# Patient Record
Sex: Female | Born: 1945
Health system: Southern US, Community
[De-identification: ages and names within clinical notes are randomized; demographics above are authoritative.]

## PROBLEM LIST (undated history)

## (undated) DIAGNOSIS — F419 Anxiety disorder, unspecified: Secondary | ICD-10-CM

## (undated) DIAGNOSIS — C50919 Malignant neoplasm of unspecified site of unspecified female breast: Secondary | ICD-10-CM

## (undated) DIAGNOSIS — K219 Gastro-esophageal reflux disease without esophagitis: Secondary | ICD-10-CM

## (undated) DIAGNOSIS — J310 Chronic rhinitis: Secondary | ICD-10-CM

## (undated) DIAGNOSIS — Z87898 Personal history of other specified conditions: Secondary | ICD-10-CM

## (undated) DIAGNOSIS — I10 Essential (primary) hypertension: Secondary | ICD-10-CM

## (undated) DIAGNOSIS — M1990 Unspecified osteoarthritis, unspecified site: Secondary | ICD-10-CM

## (undated) DIAGNOSIS — E7800 Pure hypercholesterolemia, unspecified: Secondary | ICD-10-CM

## (undated) DIAGNOSIS — K589 Irritable bowel syndrome without diarrhea: Secondary | ICD-10-CM

## (undated) DIAGNOSIS — K6389 Other specified diseases of intestine: Secondary | ICD-10-CM

## (undated) DIAGNOSIS — I872 Venous insufficiency (chronic) (peripheral): Secondary | ICD-10-CM

## (undated) HISTORY — DX: Chronic rhinitis: J31.0

## (undated) HISTORY — DX: Anxiety disorder, unspecified: F41.9

## (undated) HISTORY — DX: Essential (primary) hypertension: I10

## (undated) HISTORY — DX: Pure hypercholesterolemia, unspecified: E78.00

## (undated) HISTORY — DX: Personal history of other specified conditions: Z87.898

## (undated) HISTORY — DX: Irritable bowel syndrome, unspecified: K58.9

## (undated) HISTORY — DX: Venous insufficiency (chronic) (peripheral): I87.2

## (undated) HISTORY — DX: Unspecified osteoarthritis, unspecified site: M19.90

## (undated) HISTORY — PX: OTHER SURGICAL HISTORY: SHX169

## (undated) HISTORY — DX: Other specified diseases of intestine: K63.89

## (undated) HISTORY — DX: Malignant neoplasm of unspecified site of unspecified female breast: C50.919

## (undated) HISTORY — PX: TUBAL LIGATION: SHX77

## (undated) HISTORY — DX: Gastro-esophageal reflux disease without esophagitis: K21.9

## (undated) HISTORY — PX: TOTAL ABDOMINAL HYSTERECTOMY: SHX209

---

## 1996-07-01 ENCOUNTER — Encounter: Payer: Self-pay | Admitting: Gastroenterology

## 1999-08-18 ENCOUNTER — Encounter: Admission: RE | Admit: 1999-08-18 | Discharge: 1999-08-18 | Payer: Self-pay | Admitting: Obstetrics and Gynecology

## 1999-08-18 ENCOUNTER — Encounter: Payer: Self-pay | Admitting: Obstetrics and Gynecology

## 1999-10-24 HISTORY — PX: NEPHRECTOMY: SHX65

## 2000-03-15 ENCOUNTER — Other Ambulatory Visit: Admission: RE | Admit: 2000-03-15 | Discharge: 2000-03-15 | Payer: Self-pay | Admitting: *Deleted

## 2000-07-11 ENCOUNTER — Encounter: Payer: Self-pay | Admitting: Family Medicine

## 2000-08-07 ENCOUNTER — Encounter: Admission: RE | Admit: 2000-08-07 | Discharge: 2000-08-07 | Payer: Self-pay | Admitting: Urology

## 2000-08-07 ENCOUNTER — Encounter: Payer: Self-pay | Admitting: Urology

## 2000-08-08 ENCOUNTER — Encounter: Admission: RE | Admit: 2000-08-08 | Discharge: 2000-08-08 | Payer: Self-pay | Admitting: Urology

## 2000-08-08 ENCOUNTER — Encounter: Payer: Self-pay | Admitting: Urology

## 2000-08-23 ENCOUNTER — Encounter: Payer: Self-pay | Admitting: Obstetrics and Gynecology

## 2000-08-23 ENCOUNTER — Encounter: Admission: RE | Admit: 2000-08-23 | Discharge: 2000-08-23 | Payer: Self-pay | Admitting: Obstetrics and Gynecology

## 2000-10-03 ENCOUNTER — Encounter: Payer: Self-pay | Admitting: Pulmonary Disease

## 2000-10-03 ENCOUNTER — Ambulatory Visit (HOSPITAL_COMMUNITY): Admission: RE | Admit: 2000-10-03 | Discharge: 2000-10-03 | Payer: Self-pay | Admitting: Pulmonary Disease

## 2001-08-27 ENCOUNTER — Encounter: Admission: RE | Admit: 2001-08-27 | Discharge: 2001-08-27 | Payer: Self-pay | Admitting: Obstetrics and Gynecology

## 2001-08-27 ENCOUNTER — Encounter: Payer: Self-pay | Admitting: Obstetrics and Gynecology

## 2002-08-28 ENCOUNTER — Encounter: Admission: RE | Admit: 2002-08-28 | Discharge: 2002-08-28 | Payer: Self-pay | Admitting: Obstetrics and Gynecology

## 2002-08-28 ENCOUNTER — Encounter: Payer: Self-pay | Admitting: Obstetrics and Gynecology

## 2002-09-03 ENCOUNTER — Encounter: Payer: Self-pay | Admitting: Obstetrics and Gynecology

## 2002-09-03 ENCOUNTER — Encounter: Admission: RE | Admit: 2002-09-03 | Discharge: 2002-09-03 | Payer: Self-pay | Admitting: Obstetrics and Gynecology

## 2003-07-29 ENCOUNTER — Other Ambulatory Visit: Admission: RE | Admit: 2003-07-29 | Discharge: 2003-07-29 | Payer: Self-pay | Admitting: Obstetrics and Gynecology

## 2003-08-31 ENCOUNTER — Encounter: Admission: RE | Admit: 2003-08-31 | Discharge: 2003-08-31 | Payer: Self-pay | Admitting: Obstetrics and Gynecology

## 2004-08-31 ENCOUNTER — Ambulatory Visit (HOSPITAL_COMMUNITY): Admission: RE | Admit: 2004-08-31 | Discharge: 2004-08-31 | Payer: Self-pay | Admitting: Obstetrics and Gynecology

## 2004-10-04 ENCOUNTER — Ambulatory Visit: Payer: Self-pay | Admitting: Pulmonary Disease

## 2004-10-28 ENCOUNTER — Ambulatory Visit: Payer: Self-pay | Admitting: Pulmonary Disease

## 2005-02-09 ENCOUNTER — Ambulatory Visit: Payer: Self-pay | Admitting: Pulmonary Disease

## 2005-07-12 ENCOUNTER — Ambulatory Visit: Payer: Self-pay | Admitting: Pulmonary Disease

## 2005-08-15 ENCOUNTER — Other Ambulatory Visit: Admission: RE | Admit: 2005-08-15 | Discharge: 2005-08-15 | Payer: Self-pay | Admitting: Obstetrics and Gynecology

## 2005-10-03 ENCOUNTER — Ambulatory Visit (HOSPITAL_COMMUNITY): Admission: RE | Admit: 2005-10-03 | Discharge: 2005-10-03 | Payer: Self-pay | Admitting: Obstetrics and Gynecology

## 2006-01-09 ENCOUNTER — Ambulatory Visit: Payer: Self-pay | Admitting: Pulmonary Disease

## 2006-07-10 ENCOUNTER — Ambulatory Visit: Payer: Self-pay | Admitting: Pulmonary Disease

## 2006-07-25 ENCOUNTER — Ambulatory Visit: Payer: Self-pay | Admitting: Pulmonary Disease

## 2006-10-09 ENCOUNTER — Ambulatory Visit (HOSPITAL_COMMUNITY): Admission: RE | Admit: 2006-10-09 | Discharge: 2006-10-09 | Payer: Self-pay | Admitting: Pulmonary Disease

## 2007-01-08 ENCOUNTER — Ambulatory Visit: Payer: Self-pay | Admitting: Pulmonary Disease

## 2007-07-08 ENCOUNTER — Ambulatory Visit: Payer: Self-pay | Admitting: Pulmonary Disease

## 2007-07-08 LAB — CONVERTED CEMR LAB
ALT: 14 units/L (ref 0–35)
AST: 20 units/L (ref 0–37)
Albumin: 3.7 g/dL (ref 3.5–5.2)
Alkaline Phosphatase: 57 units/L (ref 39–117)
BUN: 12 mg/dL (ref 6–23)
Bacteria, UA: NEGATIVE
Basophils Absolute: 0 10*3/uL (ref 0.0–0.1)
Basophils Relative: 0.3 % (ref 0.0–1.0)
Bilirubin Urine: NEGATIVE
Bilirubin, Direct: 0.1 mg/dL (ref 0.0–0.3)
CO2: 33 meq/L — ABNORMAL HIGH (ref 19–32)
Calcium: 9.7 mg/dL (ref 8.4–10.5)
Chloride: 108 meq/L (ref 96–112)
Cholesterol: 213 mg/dL (ref 0–200)
Creatinine, Ser: 1 mg/dL (ref 0.4–1.2)
Crystals: NEGATIVE
Direct LDL: 149.6 mg/dL
Eosinophils Absolute: 0.1 10*3/uL (ref 0.0–0.6)
Eosinophils Relative: 1.3 % (ref 0.0–5.0)
GFR calc Af Amer: 73 mL/min
GFR calc non Af Amer: 60 mL/min
Glucose, Bld: 89 mg/dL (ref 70–99)
HCT: 38.3 % (ref 36.0–46.0)
HDL: 53.1 mg/dL (ref >39.0)
Hemoglobin, Urine: NEGATIVE
Hemoglobin: 13.1 g/dL (ref 12.0–15.0)
Ketones, ur: NEGATIVE mg/dL
Lymphocytes Relative: 33.2 % (ref 12.0–46.0)
MCHC: 34.2 g/dL (ref 30.0–36.0)
MCV: 89.8 fL (ref 78.0–100.0)
Monocytes Absolute: 0.3 10*3/uL (ref 0.2–0.7)
Monocytes Relative: 6.2 % (ref 3.0–11.0)
Mucus, UA: NEGATIVE
Neutro Abs: 2.9 10*3/uL (ref 1.4–7.7)
Neutrophils Relative %: 59 % (ref 43.0–77.0)
Nitrite: NEGATIVE
Platelets: 287 10*3/uL (ref 150–400)
Potassium: 4.1 meq/L (ref 3.5–5.1)
RBC / HPF: NONE SEEN
RBC: 4.27 M/uL (ref 3.87–5.11)
RDW: 12.9 % (ref 11.5–14.6)
Sodium: 144 meq/L (ref 135–145)
Specific Gravity, Urine: 1.02 (ref 1.000–1.03)
TSH: 1.73 microintl units/mL (ref 0.35–5.50)
Total Bilirubin: 1 mg/dL (ref 0.3–1.2)
Total CHOL/HDL Ratio: 4
Total Protein, Urine: NEGATIVE mg/dL
Total Protein: 7 g/dL (ref 6.0–8.3)
Triglycerides: 84 mg/dL (ref 0–149)
Urine Glucose: NEGATIVE mg/dL
Urobilinogen, UA: 0.2 (ref 0.0–1.0)
VLDL: 17 mg/dL (ref 0–40)
WBC: 5 10*3/uL (ref 4.5–10.5)
pH: 6.5 (ref 5.0–8.0)

## 2007-07-29 ENCOUNTER — Ambulatory Visit: Payer: Self-pay | Admitting: Gastroenterology

## 2007-09-16 ENCOUNTER — Other Ambulatory Visit: Admission: RE | Admit: 2007-09-16 | Discharge: 2007-09-16 | Payer: Self-pay | Admitting: Obstetrics and Gynecology

## 2007-10-14 ENCOUNTER — Ambulatory Visit (HOSPITAL_COMMUNITY): Admission: RE | Admit: 2007-10-14 | Discharge: 2007-10-14 | Payer: Self-pay | Admitting: Pulmonary Disease

## 2007-12-12 ENCOUNTER — Telehealth (INDEPENDENT_AMBULATORY_CARE_PROVIDER_SITE_OTHER): Payer: Self-pay | Admitting: *Deleted

## 2008-01-02 DIAGNOSIS — K589 Irritable bowel syndrome without diarrhea: Secondary | ICD-10-CM | POA: Insufficient documentation

## 2008-01-02 DIAGNOSIS — E7800 Pure hypercholesterolemia, unspecified: Secondary | ICD-10-CM | POA: Insufficient documentation

## 2008-01-02 DIAGNOSIS — I872 Venous insufficiency (chronic) (peripheral): Secondary | ICD-10-CM | POA: Insufficient documentation

## 2008-01-02 DIAGNOSIS — K219 Gastro-esophageal reflux disease without esophagitis: Secondary | ICD-10-CM | POA: Insufficient documentation

## 2008-01-02 DIAGNOSIS — I10 Essential (primary) hypertension: Secondary | ICD-10-CM | POA: Insufficient documentation

## 2008-01-02 DIAGNOSIS — E785 Hyperlipidemia, unspecified: Secondary | ICD-10-CM | POA: Insufficient documentation

## 2008-01-06 ENCOUNTER — Ambulatory Visit: Payer: Self-pay | Admitting: Pulmonary Disease

## 2008-01-08 ENCOUNTER — Encounter: Payer: Self-pay | Admitting: Pulmonary Disease

## 2008-01-11 DIAGNOSIS — R519 Headache, unspecified: Secondary | ICD-10-CM | POA: Insufficient documentation

## 2008-01-11 DIAGNOSIS — C649 Malignant neoplasm of unspecified kidney, except renal pelvis: Secondary | ICD-10-CM

## 2008-01-11 DIAGNOSIS — M1990 Unspecified osteoarthritis, unspecified site: Secondary | ICD-10-CM | POA: Insufficient documentation

## 2008-01-11 DIAGNOSIS — F411 Generalized anxiety disorder: Secondary | ICD-10-CM | POA: Insufficient documentation

## 2008-01-11 DIAGNOSIS — N3000 Acute cystitis without hematuria: Secondary | ICD-10-CM | POA: Insufficient documentation

## 2008-01-11 DIAGNOSIS — R51 Headache: Secondary | ICD-10-CM | POA: Insufficient documentation

## 2008-01-11 HISTORY — DX: Malignant neoplasm of unspecified kidney, except renal pelvis: C64.9

## 2008-01-11 LAB — CONVERTED CEMR LAB
ALT: 15 units/L (ref 0–35)
AST: 19 units/L (ref 0–37)
Albumin: 3.7 g/dL (ref 3.5–5.2)
Alkaline Phosphatase: 55 units/L (ref 39–117)
BUN: 14 mg/dL (ref 6–23)
Basophils Absolute: 0 10*3/uL (ref 0.0–0.1)
Basophils Relative: 0.8 % (ref 0.0–1.0)
Bilirubin Urine: NEGATIVE
Bilirubin, Direct: 0.1 mg/dL (ref 0.0–0.3)
CO2: 31 meq/L (ref 19–32)
Calcium: 9.8 mg/dL (ref 8.4–10.5)
Chloride: 105 meq/L (ref 96–112)
Cholesterol: 198 mg/dL (ref 0–200)
Creatinine, Ser: 1 mg/dL (ref 0.4–1.2)
Crystals: NEGATIVE
Eosinophils Absolute: 0.1 10*3/uL (ref 0.0–0.6)
Eosinophils Relative: 1.3 % (ref 0.0–5.0)
GFR calc Af Amer: 73 mL/min
GFR calc non Af Amer: 60 mL/min
Glucose, Bld: 86 mg/dL (ref 70–99)
HCT: 38.4 % (ref 36.0–46.0)
HDL: 59 mg/dL (ref >39.0)
Hemoglobin: 12.8 g/dL (ref 12.0–15.0)
Ketones, ur: NEGATIVE mg/dL
LDL Cholesterol: 123 mg/dL — ABNORMAL HIGH (ref 0–99)
Lymphocytes Relative: 34 % (ref 12.0–46.0)
MCHC: 33.3 g/dL (ref 30.0–36.0)
MCV: 90.2 fL (ref 78.0–100.0)
Monocytes Absolute: 0.5 10*3/uL (ref 0.2–0.7)
Monocytes Relative: 8 % (ref 3.0–11.0)
Mucus, UA: NEGATIVE
Neutro Abs: 3.2 10*3/uL (ref 1.4–7.7)
Neutrophils Relative %: 55.9 % (ref 43.0–77.0)
Nitrite: POSITIVE — AB
Platelets: 308 10*3/uL (ref 150–400)
Potassium: 3.7 meq/L (ref 3.5–5.1)
RBC: 4.25 M/uL (ref 3.87–5.11)
RDW: 13.1 % (ref 11.5–14.6)
Sodium: 143 meq/L (ref 135–145)
Specific Gravity, Urine: 1.02 (ref 1.000–1.03)
TSH: 1.55 microintl units/mL (ref 0.35–5.50)
Total Bilirubin: 0.7 mg/dL (ref 0.3–1.2)
Total CHOL/HDL Ratio: 3.4
Total Protein, Urine: NEGATIVE mg/dL
Total Protein: 7.1 g/dL (ref 6.0–8.3)
Triglycerides: 80 mg/dL (ref 0–149)
Urine Glucose: NEGATIVE mg/dL
Urobilinogen, UA: 0.2 (ref 0.0–1.0)
VLDL: 16 mg/dL (ref 0–40)
WBC: 5.7 10*3/uL (ref 4.5–10.5)
pH: 6 (ref 5.0–8.0)

## 2008-07-06 ENCOUNTER — Ambulatory Visit: Payer: Self-pay | Admitting: Pulmonary Disease

## 2008-07-08 LAB — CONVERTED CEMR LAB
BUN: 12 mg/dL (ref 6–23)
CO2: 30 meq/L (ref 19–32)
Calcium: 9.5 mg/dL (ref 8.4–10.5)
Chloride: 109 meq/L (ref 96–112)
Cholesterol: 139 mg/dL (ref 0–200)
Creatinine, Ser: 0.9 mg/dL (ref 0.4–1.2)
GFR calc Af Amer: 82 mL/min
GFR calc non Af Amer: 68 mL/min
Glucose, Bld: 95 mg/dL (ref 70–99)
HDL: 47.3 mg/dL (ref >39.0)
LDL Cholesterol: 77 mg/dL (ref 0–99)
Potassium: 3.7 meq/L (ref 3.5–5.1)
Sodium: 143 meq/L (ref 135–145)
Total CHOL/HDL Ratio: 2.9
Triglycerides: 74 mg/dL (ref 0–149)
VLDL: 15 mg/dL (ref 0–40)

## 2008-08-03 ENCOUNTER — Ambulatory Visit: Payer: Self-pay | Admitting: Gastroenterology

## 2008-08-12 ENCOUNTER — Telehealth (INDEPENDENT_AMBULATORY_CARE_PROVIDER_SITE_OTHER): Payer: Self-pay | Admitting: *Deleted

## 2008-08-20 ENCOUNTER — Ambulatory Visit: Payer: Self-pay | Admitting: Cardiovascular Disease

## 2008-09-01 ENCOUNTER — Telehealth: Payer: Self-pay | Admitting: Gastroenterology

## 2008-09-04 ENCOUNTER — Ambulatory Visit: Payer: Self-pay | Admitting: Gastroenterology

## 2008-09-21 ENCOUNTER — Other Ambulatory Visit: Admission: RE | Admit: 2008-09-21 | Discharge: 2008-09-21 | Payer: Self-pay | Admitting: Obstetrics and Gynecology

## 2008-09-25 ENCOUNTER — Ambulatory Visit: Payer: Self-pay | Admitting: Internal Medicine

## 2008-09-25 LAB — CONVERTED CEMR LAB
ALT: 14 units/L (ref 0–35)
AST: 17 units/L (ref 0–37)
Albumin: 3.5 g/dL (ref 3.5–5.2)
Alkaline Phosphatase: 50 units/L (ref 39–117)
Bilirubin, Direct: 0.1 mg/dL (ref 0.0–0.3)
Cholesterol: 192 mg/dL (ref 0–200)
HDL: 48.7 mg/dL (ref >39.0)
LDL Cholesterol: 125 mg/dL — ABNORMAL HIGH (ref 0–99)
TSH: 1.9 microintl units/mL (ref 0.35–5.50)
Total Bilirubin: 0.7 mg/dL (ref 0.3–1.2)
Total CHOL/HDL Ratio: 3.9
Total Protein: 6.8 g/dL (ref 6.0–8.3)
Triglycerides: 91 mg/dL (ref 0–149)
VLDL: 18 mg/dL (ref 0–40)

## 2008-10-01 ENCOUNTER — Ambulatory Visit: Payer: Self-pay | Admitting: Cardiology

## 2008-10-19 ENCOUNTER — Ambulatory Visit (HOSPITAL_COMMUNITY): Admission: RE | Admit: 2008-10-19 | Discharge: 2008-10-19 | Payer: Self-pay | Admitting: Pulmonary Disease

## 2008-10-29 ENCOUNTER — Telehealth: Payer: Self-pay | Admitting: Pulmonary Disease

## 2008-10-29 ENCOUNTER — Ambulatory Visit: Payer: Self-pay | Admitting: Pulmonary Disease

## 2008-10-29 LAB — CONVERTED CEMR LAB
Bilirubin Urine: NEGATIVE
Hemoglobin, Urine: NEGATIVE
Ketones, ur: NEGATIVE mg/dL
Leukocytes, UA: NEGATIVE
Nitrite: NEGATIVE
Specific Gravity, Urine: 1.015 (ref 1.000–1.03)
Total Protein, Urine: NEGATIVE mg/dL
Urine Glucose: NEGATIVE mg/dL
Urobilinogen, UA: 0.2 (ref 0.0–1.0)
pH: 7 (ref 5.0–8.0)

## 2008-12-23 ENCOUNTER — Ambulatory Visit: Payer: Self-pay | Admitting: Pulmonary Disease

## 2008-12-31 ENCOUNTER — Ambulatory Visit: Payer: Self-pay | Admitting: Cardiovascular Disease

## 2009-01-04 ENCOUNTER — Ambulatory Visit: Payer: Self-pay | Admitting: Pulmonary Disease

## 2009-01-04 LAB — CONVERTED CEMR LAB
ALT: 14 units/L (ref 0–35)
AST: 18 units/L (ref 0–37)
Albumin: 3.6 g/dL (ref 3.5–5.2)
Alkaline Phosphatase: 49 units/L (ref 39–117)
Bilirubin, Direct: 0.1 mg/dL (ref 0.0–0.3)
Cholesterol: 151 mg/dL (ref 0–200)
HDL: 53.6 mg/dL (ref >39.0)
LDL Cholesterol: 82 mg/dL (ref 0–99)
Total Bilirubin: 0.9 mg/dL (ref 0.3–1.2)
Total CHOL/HDL Ratio: 2.8
Total Protein: 6.7 g/dL (ref 6.0–8.3)
Triglycerides: 78 mg/dL (ref 0–149)
VLDL: 16 mg/dL (ref 0–40)

## 2009-01-12 ENCOUNTER — Ambulatory Visit: Payer: Self-pay | Admitting: Internal Medicine

## 2009-01-12 ENCOUNTER — Telehealth (INDEPENDENT_AMBULATORY_CARE_PROVIDER_SITE_OTHER): Payer: Self-pay | Admitting: *Deleted

## 2009-01-12 DIAGNOSIS — J310 Chronic rhinitis: Secondary | ICD-10-CM | POA: Insufficient documentation

## 2009-06-30 ENCOUNTER — Ambulatory Visit: Payer: Self-pay | Admitting: Cardiology

## 2009-07-01 ENCOUNTER — Ambulatory Visit: Payer: Self-pay | Admitting: Cardiovascular Disease

## 2009-07-01 LAB — CONVERTED CEMR LAB
ALT: 13 units/L (ref 0–35)
AST: 17 units/L (ref 0–37)
Albumin: 3.6 g/dL (ref 3.5–5.2)
Alkaline Phosphatase: 44 units/L (ref 39–117)
Bilirubin, Direct: 0.1 mg/dL (ref 0.0–0.3)
Cholesterol: 159 mg/dL (ref 0–200)
HDL: 53.7 mg/dL (ref >39.00)
LDL Cholesterol: 93 mg/dL (ref 0–99)
Total Bilirubin: 0.9 mg/dL (ref 0.3–1.2)
Total CHOL/HDL Ratio: 3
Total Protein: 6.9 g/dL (ref 6.0–8.3)
Triglycerides: 64 mg/dL (ref 0.0–149.0)
VLDL: 12.8 mg/dL (ref 0.0–40.0)

## 2009-07-08 ENCOUNTER — Ambulatory Visit: Payer: Self-pay | Admitting: Pulmonary Disease

## 2009-10-14 ENCOUNTER — Ambulatory Visit: Payer: Self-pay | Admitting: Pulmonary Disease

## 2009-10-20 ENCOUNTER — Ambulatory Visit: Payer: Self-pay | Admitting: Pulmonary Disease

## 2009-10-20 ENCOUNTER — Ambulatory Visit (HOSPITAL_COMMUNITY): Admission: RE | Admit: 2009-10-20 | Discharge: 2009-10-20 | Payer: Self-pay | Admitting: Pulmonary Disease

## 2009-10-24 LAB — CONVERTED CEMR LAB
ALT: 13 units/L (ref 0–35)
AST: 21 units/L (ref 0–37)
Albumin: 3.9 g/dL (ref 3.5–5.2)
Alkaline Phosphatase: 53 units/L (ref 39–117)
BUN: 9 mg/dL (ref 6–23)
Basophils Absolute: 0 10*3/uL (ref 0.0–0.1)
Basophils Relative: 0.8 % (ref 0.0–3.0)
Bilirubin Urine: NEGATIVE
Bilirubin, Direct: 0.1 mg/dL (ref 0.0–0.3)
CO2: 31 meq/L (ref 19–32)
Calcium: 9.5 mg/dL (ref 8.4–10.5)
Chloride: 104 meq/L (ref 96–112)
Cholesterol: 164 mg/dL (ref 0–200)
Creatinine, Ser: 1 mg/dL (ref 0.4–1.2)
Eosinophils Absolute: 0.1 10*3/uL (ref 0.0–0.7)
Eosinophils Relative: 2.5 % (ref 0.0–5.0)
GFR calc non Af Amer: 72.13 mL/min (ref >60)
Glucose, Bld: 94 mg/dL (ref 70–99)
HCT: 40.3 % (ref 36.0–46.0)
HDL: 51.4 mg/dL (ref >39.00)
Hemoglobin, Urine: NEGATIVE
Hemoglobin: 13.2 g/dL (ref 12.0–15.0)
LDL Cholesterol: 98 mg/dL (ref 0–99)
Leukocytes, UA: NEGATIVE
Lymphocytes Relative: 37.7 % (ref 12.0–46.0)
Lymphs Abs: 1.9 10*3/uL (ref 0.7–4.0)
MCHC: 32.8 g/dL (ref 30.0–36.0)
MCV: 95 fL (ref 78.0–100.0)
Monocytes Absolute: 0.5 10*3/uL (ref 0.1–1.0)
Monocytes Relative: 10.3 % (ref 3.0–12.0)
Neutro Abs: 2.6 10*3/uL (ref 1.4–7.7)
Neutrophils Relative %: 48.7 % (ref 43.0–77.0)
Nitrite: NEGATIVE
Platelets: 268 10*3/uL (ref 150.0–400.0)
Potassium: 3.8 meq/L (ref 3.5–5.1)
RBC: 4.24 M/uL (ref 3.87–5.11)
RDW: 13.1 % (ref 11.5–14.6)
Sodium: 141 meq/L (ref 135–145)
Specific Gravity, Urine: 1.02 (ref 1.000–1.030)
TSH: 2.26 microintl units/mL (ref 0.35–5.50)
Total Bilirubin: 0.8 mg/dL (ref 0.3–1.2)
Total CHOL/HDL Ratio: 3
Total Protein: 7.2 g/dL (ref 6.0–8.3)
Triglycerides: 74 mg/dL (ref 0.0–149.0)
Urine Glucose: NEGATIVE mg/dL
Urobilinogen, UA: 0.2 (ref 0.0–1.0)
VLDL: 14.8 mg/dL (ref 0.0–40.0)
Vit D, 25-Hydroxy: 54 ng/mL (ref 30–89)
WBC: 5.1 10*3/uL (ref 4.5–10.5)
pH: 6.5 (ref 5.0–8.0)

## 2009-12-28 ENCOUNTER — Ambulatory Visit: Payer: Self-pay | Admitting: Pulmonary Disease

## 2009-12-30 ENCOUNTER — Ambulatory Visit: Payer: Self-pay | Admitting: Cardiology

## 2010-01-17 LAB — CONVERTED CEMR LAB
ALT: 22 units/L (ref 0–35)
AST: 20 units/L (ref 0–37)
Albumin: 3.8 g/dL (ref 3.5–5.2)
Alkaline Phosphatase: 60 units/L (ref 39–117)
Bilirubin, Direct: 0.1 mg/dL (ref 0.0–0.3)
Cholesterol: 163 mg/dL (ref 0–200)
HDL: 56.3 mg/dL (ref >39.00)
LDL Cholesterol: 92 mg/dL (ref 0–99)
Total Bilirubin: 0.7 mg/dL (ref 0.3–1.2)
Total CHOL/HDL Ratio: 3
Total Protein: 7.6 g/dL (ref 6.0–8.3)
Triglycerides: 73 mg/dL (ref 0.0–149.0)
VLDL: 14.6 mg/dL (ref 0.0–40.0)

## 2010-01-25 ENCOUNTER — Telehealth: Payer: Self-pay | Admitting: Pulmonary Disease

## 2010-01-27 ENCOUNTER — Ambulatory Visit: Payer: Self-pay | Admitting: Pulmonary Disease

## 2010-04-12 ENCOUNTER — Ambulatory Visit: Payer: Self-pay | Admitting: Pulmonary Disease

## 2010-07-04 ENCOUNTER — Telehealth: Payer: Self-pay | Admitting: Pulmonary Disease

## 2010-10-12 ENCOUNTER — Ambulatory Visit: Payer: Self-pay | Admitting: Pulmonary Disease

## 2010-10-21 ENCOUNTER — Encounter: Payer: Self-pay | Admitting: Pulmonary Disease

## 2010-10-21 ENCOUNTER — Ambulatory Visit (HOSPITAL_COMMUNITY)
Admission: RE | Admit: 2010-10-21 | Discharge: 2010-10-21 | Payer: Self-pay | Source: Home / Self Care | Attending: Pulmonary Disease | Admitting: Pulmonary Disease

## 2010-10-23 LAB — CONVERTED CEMR LAB
ALT: 18 units/L (ref 0–35)
AST: 21 units/L (ref 0–37)
Albumin: 4 g/dL (ref 3.5–5.2)
Alkaline Phosphatase: 67 units/L (ref 39–117)
BUN: 14 mg/dL (ref 6–23)
Basophils Absolute: 0.1 10*3/uL (ref 0.0–0.1)
Basophils Relative: 1.3 % (ref 0.0–3.0)
Bilirubin Urine: NEGATIVE
Bilirubin, Direct: 0.1 mg/dL (ref 0.0–0.3)
Blood, UA: NEGATIVE
CO2: 31 meq/L (ref 19–32)
Calcium: 9.8 mg/dL (ref 8.4–10.5)
Chloride: 108 meq/L (ref 96–112)
Cholesterol: 172 mg/dL (ref 0–200)
Creatinine, Ser: 1 mg/dL (ref 0.4–1.2)
Eosinophils Absolute: 0.1 10*3/uL (ref 0.0–0.7)
Eosinophils Relative: 1.8 % (ref 0.0–5.0)
GFR calc non Af Amer: 71.08 mL/min (ref >60.00)
Glucose, Bld: 82 mg/dL (ref 70–99)
HCT: 38.4 % (ref 36.0–46.0)
HDL: 50.3 mg/dL (ref >39.00)
Hemoglobin: 13 g/dL (ref 12.0–15.0)
Ketones, ur: NEGATIVE mg/dL
LDL Cholesterol: 108 mg/dL — ABNORMAL HIGH (ref 0–99)
Lymphocytes Relative: 33 % (ref 12.0–46.0)
Lymphs Abs: 1.9 10*3/uL (ref 0.7–4.0)
MCHC: 34 g/dL (ref 30.0–36.0)
MCV: 90.6 fL (ref 78.0–100.0)
Monocytes Absolute: 0.4 10*3/uL (ref 0.1–1.0)
Monocytes Relative: 6.9 % (ref 3.0–12.0)
Neutro Abs: 3.3 10*3/uL (ref 1.4–7.7)
Neutrophils Relative %: 57 % (ref 43.0–77.0)
Nitrite: NEGATIVE
Platelets: 285 10*3/uL (ref 150.0–400.0)
Potassium: 4.8 meq/L (ref 3.5–5.1)
RBC: 4.23 M/uL (ref 3.87–5.11)
RDW: 15 % — ABNORMAL HIGH (ref 11.5–14.6)
Sodium: 143 meq/L (ref 135–145)
Specific Gravity, Urine: 1.025 (ref 1.000–1.030)
TSH: 1.88 microintl units/mL (ref 0.35–5.50)
Total Bilirubin: 0.8 mg/dL (ref 0.3–1.2)
Total CHOL/HDL Ratio: 3
Total Protein, Urine: NEGATIVE mg/dL
Total Protein: 7.1 g/dL (ref 6.0–8.3)
Triglycerides: 69 mg/dL (ref 0.0–149.0)
Urine Glucose: NEGATIVE mg/dL
Urobilinogen, UA: 0.2 (ref 0.0–1.0)
VLDL: 13.8 mg/dL (ref 0.0–40.0)
WBC: 5.7 10*3/uL (ref 4.5–10.5)
pH: 6 (ref 5.0–8.0)

## 2010-11-22 ENCOUNTER — Telehealth: Payer: Self-pay | Admitting: Pulmonary Disease

## 2010-11-22 NOTE — Assessment & Plan Note (Signed)
Summary: rov 6 months///kp   Primary Care Provider:  Kriste Basque  CC:  6 month ROV & review of mult medical problems....  History of Present Illness: 66 y/o BF here for a follow up visit... she has mult med problems as noted below...    ~  Mar10:  she continues to c/o right knee pain (?injury in [place] last yr)... she was seen at [facility]- had XRays & shot, but she wasn't satisfied... she states that she'll try OTC Advil + Tylenol and an exercise program... I rec that she consider a knee brace when walking... otherw doing well without new complaints or concerns...  ~  Dec10:  doing well without new complaints... sister dx w/ breast ca & pt concerned about her hormones- slowly weaning down after discussing this w/ Gyn... she's also taking some "powder" and peppermint from her herbalist & her IBS is improved...    ~  April 12, 2010:  her Avalide was changed to Hyzaar per her insurnce co & BP remains well controlled... she has been tolerating & FLP looks good on the Crestor5- 1/2 daily... she reports bowels doing very well on "Daily Cleanse Pill" from the health food store... she's been working out at the gym regularly but weight up 6#, therefore we reviewed diet recommendations.    Current Problems:   HYPERTENSION (ICD-401.9) - controlled on LOSARTAN/ HCT 100-12.5 taking 1/2 tab daily... BP=110/82 and tol Rx well... she denies HA, fatigue, visual changes, CP, palipit, dizziness, syncope, dyspnea, edema, etc...  VENOUS INSUFFICIENCY (ICD-459.81) - low salt diet, support hose when nec... doing well.  HYPERCHOLESTEROLEMIA (ICD-272.0) - changed to CRESTOR 5mg - 1/2 tab daily + diet therapy + OTC supplements... weight=188# is up 6# in 14mo...   We discussed diet +exercise to get weight back down... referred to Lipid Clinic 12/09.  ~  FLP 3/09 showed TChol 198, TG 80, HDL 59, LDL 123... not at goal- start Simvast20/d.  ~  FLP 9/09 showed TChol 139, TG 74, HDL 47, LDL 77... doing well on Simva20, but  "intol" per pt.  ~  FLP 3/10 on Crestor2.5 showed TChol 151, TG 78, HDL 54, LDL 82... continue same & Lipid Clinic.  ~  FLP 9/10 showed TChol 159, TG 64, HDL 54, LDL 93  ~  FLP 12/10 showed TChol 164, TG 74, HDL 51, LDL 98  ~  FLP 3/11 showed TChol 163, TG 73, HDL 56, LDL 92  GERD (ICD-530.81)  IRRITABLE BOWEL SYNDROME (ICD-564.1) - followed by [name] and notes reviewed... he rec increased fiber, Benefiber, Robinul as needed... she prefers therapy from an herbalist and taking "daily cleanse pill" - improved.  ~  prev colonoscopy 9/01 by [name] was WNL.Marland Kitchen.   ~  f/u colonoscopy 11/09 by [name] was neg- WNL, f/u planned 28yrs...  Hx of CARCINOMA, RENAL CELL (ICD-189.0) - s/p right nephrectomy 2001 at [facility] by [facility]... he signed off in 2006 and exam/ CXR's have been neg-   ~  f/u CXR 9/09 was clear, WNL...  ~  12/10:  we discussed CXR (neg- WNL) & full labs (OK)& she will return for Fasting blood work...  DEGENERATIVE JOINT DISEASE (ICD-715.90) - she's improved w/ exercise program... but c/o bilat R>L knee discomfort w/ eval by Ortho (?who) w/ XRays and shot... doesn't want Rx- using OTC anti-inflamm + Tylenol...  Hx of HEADACHE (ICD-784.0)  ANXIETY (ICD-300.00) - she does not wish to have her Alpraz refilled...  Health Maintenance - GYN= [name] and she does PAPs  and checked Vit D level on pt (pt reports taking 29562 u Vit D weekly from her)... she notes sister dx w/ breast cancer 2010 & she wants to wean off her hormone Rx, & has discussed this w/ Gyn... she gets regular Mammograms as well...   Preventive Screening-Counseling & Management  Alcohol-Tobacco     Smoking Status: quit     Year Quit: 1990  Allergies (verified): No Known Drug Allergies  Comments:  Nurse/Medical Assistant: The patient's medications and allergies were reviewed with the patient and were updated in the Medication and Allergy Lists.  Past History:  Past Medical History: CHRONIC RHINITIS  (ICD-472.0) HYPERTENSION (ICD-401.9) VENOUS INSUFFICIENCY (ICD-459.81) HYPERCHOLESTEROLEMIA (ICD-272.0) GERD (ICD-530.81) IRRITABLE BOWEL SYNDROME (ICD-564.1) Hx of CARCINOMA, RENAL CELL (ICD-189.0) DEGENERATIVE JOINT DISEASE (ICD-715.90) Hx of HEADACHE (ICD-784.0) ANXIETY (ICD-300.00)  Past Surgical History: S/P right nephrectomy for renal cell CA in 2001 S/P hysterectomy  Family History: Reviewed history from 07/06/2008 and no changes required. Mother alive age 88 Father deceased age 63 from prostate cancer 1 sibling alive age 7 1 sibling alive age 25  1 sbiling alive age 85  Social History: Reviewed history from 07/06/2008 and no changes required. quit smoking 20+ years ago--quit 1990 smoked for 10 years exercises 3 times per week caffeine use:  2 cups per day etoh---1 per month divorced 2 children  Review of Systems      See HPI  The patient denies anorexia, fever, weight loss, weight gain, vision loss, decreased hearing, hoarseness, chest pain, syncope, dyspnea on exertion, peripheral edema, prolonged cough, headaches, hemoptysis, abdominal pain, melena, hematochezia, severe indigestion/heartburn, hematuria, incontinence, muscle weakness, suspicious skin lesions, transient blindness, difficulty walking, depression, unusual weight change, abnormal bleeding, enlarged lymph nodes, and angioedema.    Vital Signs:  Patient profile:   65 year old female Height:      65 inches Weight:      188 pounds BMI:     31.40 O2 Sat:      100 % on Room air Temp:     98.7 degrees F oral Pulse rate:   53 / minute BP sitting:   110 / 82  (left arm) Cuff size:   regular  Vitals Entered By: Randell Loop CMA (April 12, 2010 10:25 AM)  O2 Sat at Rest %:  100 O2 Flow:  Room air CC: 6 month ROV & review of mult medical problems... Is Patient Diabetic? No Pain Assessment Patient in pain? no      Comments meds updated today with pt   Physical Exam  Additional Exam:  WD,  Overweight, 65 y/o BF in NAD... GENERAL:  Alert & oriented; pleasant & cooperative... HEENT:  [place]/AT, EOM-wnl, PERRLA, EACs-clear, TMs-wnl, NOSE-clear, THROAT-clear & wnl. NECK:  Supple w/ fairROM; no JVD; normal carotid impulses w/o bruits; no thyromegaly or nodules palpated; no lymphadenopathy. CHEST:  Clear to P & A; without wheezes/ rales/ or rhonchi. HEART:  Regular Rhythm; without murmurs/ rubs/ or gallops. ABDOMEN:  Soft & nontender; normal bowel sounds; no organomegaly or masses detected. EXT: without deformities, mod arthritic changes w/ bilat knee crepitus on exam;  no varicose veins/ +venous insuffic/ no edema. NEURO:  CN's intact; motor testing normal; sensory testing normal; gait normal & balance OK. DERM:  No lesions noted; no rash etc... flower tatoo over right breast area...    Impression & Recommendations:  Problem # 1:  HYPERTENSION (ICD-401.9) Controlled on meds... Her updated medication list for this problem includes:    Losartan Potassium-hctz 100-12.5 Mg  Tabs (Losartan potassium-hctz) .Marland Kitchen... 1/2 by mouth once daily  Problem # 2:  HYPERCHOLESTEROLEMIA (ICD-272.0) Doing well on Crestor 2.5mg /d... Her updated medication list for this problem includes:    Crestor 5 Mg Tabs (Rosuvastatin calcium) .Marland Kitchen... Take 1/2 tab by mouth once daily...  Problem # 3:  IRRITABLE BOWEL SYNDROME (ICD-564.1) Stable w/ supplements from her herbalist...  Problem # 4:  Hx of CARCINOMA, RENAL CELL (ICD-189.0) No signs of recurrent dis>  labs & CXR 12/10 were WNL.Marland KitchenMarland Kitchen  Problem # 5:  DEGENERATIVE JOINT DISEASE (ICD-715.90) Stable on exercise program... Her updated medication list for this problem includes:    Adult Aspirin Low Strength 81 Mg Tbdp (Aspirin) .Marland Kitchen... 1 tab by mouth once daily...  Problem # 6:  ANXIETY (ICD-300.00) She still does not want anxiolytic Rx...  Problem # 7:  OTHER MEDICAL PROBLEMS AS NOTED>>>  Complete Medication List: 1)  Adult Aspirin Low Strength 81 Mg Tbdp  (Aspirin) .Marland Kitchen.. 1 tab by mouth once daily.Marland KitchenMarland Kitchen 2)  Losartan Potassium-hctz 100-12.5 Mg Tabs (Losartan potassium-hctz) .... 1/2 by mouth once daily 3)  Crestor 5 Mg Tabs (Rosuvastatin calcium) .... Take 1/2 tab by mouth once daily.Marland KitchenMarland Kitchen 4)  Coenzyme Q10 100 Mg Caps (Coenzyme q10) .... Take 1 tablet by mouth once a day 5)  Calcium-vitamin D 500-200 Mg-unit Tabs (Calcium carbonate-vitamin d) .... Take one tablet twice a day 6)  Multivitamins Tabs (Multiple vitamin) .... Take one daily 7)  Ergocalciferol 50000 Unit Caps (Ergocalciferol) .... Take 1 tablet by mouth once a week 8)  Biotin 10 Mg Tabs (Biotin) .... Take 1 tablet by mouth once a day 9)  Evening Primrose Oil 1000 Mg Caps (Evening primrose oil) .... Take 1 tablet by mouth once a day 10)  Alpha Lipoic Acid  .... Take 1 tablet by mouth once a day  Patient Instructions: 1)  Today we updated your med list- see below.... 2)  We refilled your Crestor per request... 3)  Conmtinue your current meds the same... 4)  Keep up the good work w/ diet + exercise... 5)  Call for any problems.Marland KitchenMarland Kitchen 6)  Please schedule a follow-up appointment in 6 months, and we will plan FASTING blood work at that time. Prescriptions: CRESTOR 5 MG TABS (ROSUVASTATIN CALCIUM) take 1/2 tab by mouth once daily...  #90 x 4   Entered and Authorized by:   Michele Mcalpine MD   Signed by:   Michele Mcalpine MD on 04/12/2010   Method used:   Print then Give to Patient   RxID:   [id number]

## 2010-11-22 NOTE — Progress Notes (Signed)
Summary: rx request  Phone Note Call from Patient   Caller: Patient Call For: Helen Cuff Summary of Call: pt lost her rx for crestor. would like to pick another rx up today. pt # [id number] Initial call taken by: Tivis Ringer, CNA,  July 04, 2010 8:55 AM  Follow-up for Phone Call        [facility] since she usually uses [facility]. Pt lost rx from June and request to have same left at front desk pt aware. Zackery Barefoot CMA  July 04, 2010 9:12 AM     Prescriptions: CRESTOR 5 MG TABS (ROSUVASTATIN CALCIUM) take 1/2 tab by mouth once daily...  #90 x 4   Entered by:   Zackery Barefoot CMA   Authorized by:   Michele Mcalpine MD   Signed by:   Zackery Barefoot CMA on 07/04/2010   Method used:   Print then Give to Patient   RxID:   [id number]

## 2010-11-22 NOTE — Progress Notes (Signed)
Summary: medication  Phone Note Call from Patient Call back at [phone]   Caller: Patient Call For: Ching Rabideau Summary of Call: need to discuss avalide ( blood pressure med)   [facility] [address] Initial call taken by: Rickard Patience,  January 25, 2010 9:55 AM  Follow-up for Phone Call        called and spoke with pt.  pt states she received a letter in the mail from her insurance company stating they were going to increase her avalide co-pay.  Pt would like an alternative bp med.  I asked pt if her insurance company gave her alternative options and pt stated no.  Please advise.  Thanks.  Aundra Millet Reynolds LPN  January 26, 4539 11:07 AM   Additional Follow-up for Phone Call Additional follow up Details #1::        please make appt for pt to see TP this week and bring in her formulary from her insurance company.  thanks Randell Loop CMA  January 25, 2010 11:58 AM   pt scheduled to see TP on 01/27/10 at 11:45. pt aware to bring formulary. Carron Curie CMA  January 25, 2010 2:46 PM

## 2010-11-22 NOTE — Assessment & Plan Note (Signed)
Summary: rov-tp   Primary Provider:  Kriste Basque  CC:  dyslipidemia follow-up.  History of Present Illness:  Lipid Clinic Visit      The patient comes in today for dyslipidemia follow-up.  The patient denies medication problems, fatigue, nausea, vomiting, chest pain, muscle aches, and muscle cramps.  Dietary compliance review reveals an overall grade of A, pt is eating 3-5 fruits and vegetables daily and is limiting fats and TFA's.  Review of exercise habits reveals that the patient is doing a walk/aerobic exercise video daily for 20-30 minutes.  Adjunctive measures being instituted include flaxseed oil.    Mrs. Brauner presents for follow-up in lipid clinic today.  She is compliant with her current regimen of Crestor 2.5 mg daily and also takes flaxseed oil 1 tbsp daily.  She denies any medication problems.  Her diet is well controlled as follows:  Oatmeal with apples/walnuts, 2 egg whites each morning Lots of green vegetables at least 3 servings a day beverages:  tea, water, and coffee, no soda, no juice dairy products: not often, greek yogurt, cheese, low fat, silk cooking:  grill and bake mostly, occassional fry, uses olive oil very little snacking, if so, healthy snack, fruit, granola, raisins, etc.  Exercise includes a walk/aerobic video daily for 20-30 minutes which equals about 3 miles of walking.  The patient is interested in joining a gym and has set a wt loss goal of 10 lbs this year, she wants to get down to 160 lbs.   Patient does not smoke and drinks ONLY a couple times per month.    Lipid Management Provider  Eda Keys, PharmD  Allergies: No Known Drug Allergies   Vital Signs:  Patient profile:   65 year old female Weight:      182 pounds BP sitting:   108 / 72  (left arm)  Impression & Recommendations:  Problem # 1:  HYPERCHOLESTEROLEMIA (ICD-272.0) Assessment Unchanged Lipids are being maintained at goal:  TC 163, TG 73, HDL 56.3, LDL 92 (goal <100).  Mrs.  Helf is well controlled with regard to diet.  She is engaged in an exercise routine and plans to implement more exercise in order to meet her wt loss goal of 10 lbs (down to 160 lbs).  She is compliant with her medications and under good control.  At this time, we will have Mrs. Sanjurjo begin being followed by her PCP, Dr. Kriste Basque.  It has been a pleasure working with Mrs. Rasnic.  Her updated medication list for this problem includes:    Crestor 5 Mg Tabs (Rosuvastatin calcium) .Marland Kitchen... Take 1/2 tab by mouth once daily.Marland KitchenMarland Kitchen

## 2010-11-22 NOTE — Assessment & Plan Note (Signed)
Summary: discuss change avalide//jrc   Visit Type:  Follow-up Primary Provider/Referring Provider:  Kriste Basque  CC:  Pt here for medication check. Pt states insurance company request change from Avalide to a drug on the preferred drug list. Pt has list with her today. Pt has no complaints.  History of Present Illness: 60 yowf quit smoking in her 30's with variable tendency to seasonal rhinitis.  January 12, 2009 fare of stuffy nose and bilateral facial pain  x 5 days with zyrtec and sinutab and nettipot, mucus is thin and white, no sob  or sign cough.     July 08, 2009--Presents for 6 month follow up - no complaints, needs no refills.Has been exercising. Getting married next year. , wants to lose 10 pounds. recent labs w/ FLP showed LDL at goal on crestor.   January 27, 2010--Presents for follow up and med check.Pt states insurance company request change from Avalide to a drug on the preferred drug list. Pt has list with her today. b/p has been doing well. NO complaints. She is feeling good. Trying to start exercise regimen. BlP today well controlled. Wants a 90 day supply sent to mail order. Denies chest pain, dyspnea, orthopnea, hemoptysis, fever, n/v/d, edema, headache,recent travel or antibiotics.    Current Medications (verified): 1)  Adult Aspirin Low Strength 81 Mg  Tbdp (Aspirin) .Marland Kitchen.. 1 Tab By Mouth Once Daily.Marland KitchenMarland Kitchen 2)  Avalide 150-12.5 Mg Tabs (Irbesartan-Hydrochlorothiazide) .... Take 1/2 Tab By Mouth Once Daily.Marland KitchenMarland Kitchen 3)  Crestor 5 Mg Tabs (Rosuvastatin Calcium) .... Take 1/2 Tab By Mouth Once Daily.Marland KitchenMarland Kitchen 4)  Calcium-Vitamin D 500-200 Mg-Unit Tabs (Calcium Carbonate-Vitamin D) .... Take One Tablet Twice A Day 5)  Multivitamins  Tabs (Multiple Vitamin) .... Take One Daily 6)  Ergocalciferol 50000 Unit Caps (Ergocalciferol) .... Take 1 Tablet By Mouth Once A Week 7)  Coenzyme Q10 100 Mg Caps (Coenzyme Q10) .... Take 1 Tablet By Mouth Once A Day 8)  Alpha Lipoic Acid .... Take 1 Tablet By Mouth  Once A Day  Allergies (verified): No Known Drug Allergies  Past History:  Past Medical History: Last updated: 10/14/2009  CHRONIC RHINITIS (ICD-472.0) HYPERTENSION (ICD-401.9) VENOUS INSUFFICIENCY (ICD-459.81) HYPERCHOLESTEROLEMIA (ICD-272.0) GERD (ICD-530.81) IRRITABLE BOWEL SYNDROME (ICD-564.1) Hx of CARCINOMA, RENAL CELL (ICD-189.0) DEGENERATIVE JOINT DISEASE (ICD-715.90) Hx of HEADACHE (ICD-784.0) ANXIETY (ICD-300.00)  Past Surgical History: Last updated: 10/14/2009 S/P right nephrectomy for renal cell CA in 2001 S/P hysterectomy  Family History: Last updated: 07/06/2008 Mother alive age 77 Father deceased age 43 from prostate cancer 1 sibling alive age 26 1 sibling alive age 32  1 sbiling alive age 80  Social History: Last updated: 07/06/2008 quit smoking 20+ years ago smoked for 10 years exercises 3 times per week caffeine use:  2 cups per day etoh---1 per month divorced 2 children  Risk Factors: Smoking Status: quit (07/06/2008)  Review of Systems      See HPI  Vital Signs:  Patient profile:   65 year old female Height:      65 inches Weight:      190.25 pounds O2 Sat:      100 % on Room air Temp:     98.4 degrees F oral Pulse rate:   54 / minute BP sitting:   122 / 70  (left arm) Cuff size:   large  Vitals Entered By: Zackery Barefoot CMA (January 27, 2010 11:29 AM)  O2 Flow:  Room air CC: Pt here for medication check. Pt states insurance company request  change from Avalide to a drug on the preferred drug list. Pt has list with her today. Pt has no complaints Comments Medications reviewed with patient Verified contact number and pharmacy with patient Zackery Barefoot CMA  January 27, 2010 11:29 AM    Physical Exam  Additional Exam:  amb wf nad.   wt 183  January 12, 2009 >> 185 9/16 >>190 January 27, 2010  Afeb with normal vital signs HEENT: nl dentition, turbinates, and orophanx. Nl external ear canals without cough reflex Neck without  JVD/Nodes/TM Lungs clear to A and P bilaterally without cough on insp or exp maneuvers RRR no s3 or murmur or increase in P2 Abd soft and benign with nl excursion in the supine position. No bruits or organomegaly Ext warm without calf tenderness, cyanosis clubbing or edema    Impression & Recommendations:  Problem # 1:  HYPERTENSION (ICD-401.9)  Well controlled on rx. Reviewed pt formulary and changed to generic hyzaar 100/12.5mg  1/2 once daily  advised to check 3x wk and follow up 2 months   Her updated medication list for this problem includes:    Losartan Potassium-hctz 100-12.5 Mg Tabs (Losartan potassium-hctz) .Marland Kitchen... 1/2 by mouth once daily  Orders: Est. Patient Level III ([address])  Problem # 2:  HYPERCHOLESTEROLEMIA (ICD-272.0)  at goal, cont on diet and exercise.  Her updated medication list for this problem includes:    Crestor 5 Mg Tabs (Rosuvastatin calcium) .Marland Kitchen... Take 1/2 tab by mouth once daily...  Labs Reviewed: SGOT: 20 (12/28/2009)   SGPT: 22 (12/28/2009)   HDL:56.30 (12/28/2009), 51.40 (10/20/2009)  LDL:92 (12/28/2009), 98 (10/20/2009)  Chol:163 (12/28/2009), 164 (10/20/2009)  Trig:73.0 (12/28/2009), 74.0 (10/20/2009)  Orders: Est. Patient Level III ([address])  Problem # 3:  GERD (ICD-530.81)  controlled on diet   Orders: Est. Patient Level III ([address]11)  Medications Added to Medication List This Visit: 1)  Losartan Potassium-hctz 100-12.5 Mg Tabs (Losartan potassium-hctz) .... 1/2 by mouth once daily 2)  Ergocalciferol 50000 Unit Caps (Ergocalciferol) .... Take 1 tablet by mouth once a week 3)  Coenzyme Q10 100 Mg Caps (Coenzyme q10) .... Take 1 tablet by mouth once a day 4)  Alpha Lipoic Acid  .... Take 1 tablet by mouth once a day  Complete Medication List: 1)  Adult Aspirin Low Strength 81 Mg Tbdp (Aspirin) .Marland Kitchen.. 1 tab by mouth once daily.Marland KitchenMarland Kitchen 2)  Losartan Potassium-hctz 100-12.5 Mg Tabs (Losartan potassium-hctz) .... 1/2 by mouth once daily 3)  Crestor 5 Mg  Tabs (Rosuvastatin calcium) .... Take 1/2 tab by mouth once daily.Marland KitchenMarland Kitchen 4)  Calcium-vitamin D 500-200 Mg-unit Tabs (Calcium carbonate-vitamin d) .... Take one tablet twice a day 5)  Multivitamins Tabs (Multiple vitamin) .... Take one daily 6)  Ergocalciferol 50000 Unit Caps (Ergocalciferol) .... Take 1 tablet by mouth once a week 7)  Coenzyme Q10 100 Mg Caps (Coenzyme q10) .... Take 1 tablet by mouth once a day 8)  Alpha Lipoic Acid  .... Take 1 tablet by mouth once a day  Patient Instructions: 1)  Will change Avalide to Losartan HCT 100/12.5mg  1/2 once daily  2)  check blood pressure 3x/wk , call if b/p >160.  3)  bring log to next visit 4)  follow up Dr. Kriste Basque in 2 months and as needed  5)  Please contact office for sooner follow up if symptoms do not improve or worsen  Prescriptions: LOSARTAN POTASSIUM-HCTZ 100-12.5 MG TABS (LOSARTAN POTASSIUM-HCTZ) 1/2 by mouth once daily  #90 x 3   Entered  and Authorized by:   Rubye Oaks NP   Signed by:   Ameshia Pewitt NP on 01/27/2010   Method used:   Faxed to ...       [facility])       [address] [place], [place]  [address]       Ph: [phone]       Fax: [phone]   RxID:   [id number]

## 2010-11-23 ENCOUNTER — Ambulatory Visit (INDEPENDENT_AMBULATORY_CARE_PROVIDER_SITE_OTHER): Payer: 59

## 2010-11-23 ENCOUNTER — Encounter: Payer: Self-pay | Admitting: Pulmonary Disease

## 2010-11-23 DIAGNOSIS — Z23 Encounter for immunization: Secondary | ICD-10-CM

## 2010-11-24 NOTE — Assessment & Plan Note (Signed)
Summary: rov 6 months/ fast labs///kp   Primary Care Provider:  Kriste Basque  CC:  6 month ROV & review of mult medical problems....  History of Present Illness: 65 y/o BF here for a follow up visit... she has mult med problems as noted below...    ~  Mar10:  she continues to c/o right knee pain (?injury in [place] last yr)... she was seen at [facility]- had XRays & shot, but she wasn't satisfied... she states that she'll try OTC Advil + Tylenol and an exercise program... I rec that she consider a knee brace when walking... otherw doing well without new complaints or concerns...  ~  Dec10:  doing well without new complaints... sister dx w/ breast ca & pt concerned about her hormones- slowly weaning down after discussing this w/ Gyn... she's also taking some "powder" and peppermint from her herbalist & her IBS is improved...    ~  April 12, 2010:  her Avalide was changed to Hyzaar per her insurnce co & BP remains well controlled... she has been tolerating & FLP looks good on the Crestor5- 1/2 daily... she reports bowels doing very well on "Daily Cleanse Pill" from the health food store... she's been working out at the gym regularly but weight up 6#, therefore we reviewed diet recommendations.   ~  October 12, 2010:  she's had a good 971mo- just incr stress w/ the holiday & family... she's gained 5# to 193# & we discussed diet + exercise... BP controlled on meds;  tolerating Cres2.5mg /d w/ CoQ10 & due for fasting labs as well;  GI stable & no GU symptoms> due for f/u CXR w/ hx renal cell ca & right nephrectomy 2001;  she saw GYN, [name], w/ Vit D level = 55 by her report & they decr her 50K supplement to [facility] & BMD due soon from [facility]... she still takes mult herbs/ supplements "it helps, I'm doing good"...    Current Problems:   HYPERTENSION (ICD-401.9) - controlled on LOSARTAN/ HCT 100-12.5 taking 1/2 tab daily... BP=114/74 and tol Rx well... she denies HA, fatigue, visual changes, CP,  palipit, dizziness, syncope, dyspnea, edema, etc...  VENOUS INSUFFICIENCY (ICD-459.81) - low salt diet, support hose when nec... she reports hammer toe per podiatry but she doesn't want surg...  HYPERCHOLESTEROLEMIA (ICD-272.0) - changed to CRESTOR 5mg - 1/2 tab daily + diet therapy + OTC supplements... weight=188# is up 6# in 971mo...   We discussed diet +exercise to get weight back down... referred to Lipid Clinic 12/09.  ~  FLP 3/09 showed TChol 198, TG 80, HDL 59, LDL 123... not at goal- start Simvast20/d.  ~  FLP 9/09 showed TChol 139, TG 74, HDL 47, LDL 77... doing well on Simva20, but "intol" per pt.  ~  FLP 3/10 on Crestor2.5 showed TChol 151, TG 78, HDL 54, LDL 82... continue same & Lipid Clinic.  ~  FLP 9/10 showed TChol 159, TG 64, HDL 54, LDL 93  ~  FLP 12/10 showed TChol 164, TG 74, HDL 51, LDL 98  ~  FLP 3/11 showed TChol 163, TG 73, HDL 56, LDL 92  ~  FLP 12/11 on Cres2.5 showed TChol 172, TG 69, HDL 50, LDL 108  GERD (ICD-530.81) IRRITABLE BOWEL SYNDROME (ICD-564.1) - followed by [name] and notes reviewed... he rec increased fiber, Benefiber, Robinul as needed... she prefers therapy from an herbalist and taking "daily cleanse pill" - improved.  ~  prev colonoscopy 9/01 by [name] was WNL.Marland KitchenMarland Kitchen   ~  f/u colonoscopy 11/09 by [name] was neg- WNL, f/u planned 47yrs...  Hx of CARCINOMA, RENAL CELL (ICD-189.0) - s/p right nephrectomy 2001 at [facility] by [facility]... he signed off in 2006 and exam/ CXR's have been neg-   ~  f/u CXR 9/09 was clear, WNL...  ~  12/10:  we discussed CXR (neg- WNL) & full labs (OK)& she will return for Fasting blood work...  ~  12/11:  CXR remains neg- clear, WNL.Marland Kitchen. labs showed   DEGENERATIVE JOINT DISEASE (ICD-715.90) - she's improved w/ exercise program... but c/o bilat R>L knee discomfort w/ eval by Ortho (?who) w/ XRays and shot... doesn't want Rx- using OTC anti-inflamm + Tylenol...  Hx of HEADACHE (ICD-784.0)  ANXIETY (ICD-300.00) - she does  not wish to have her Alpraz refilled...  Health Maintenance - GYN= [name] and she does PAPs and checked Vit D level on pt (pt reports taking 61607 u Vit D every other day now w/ her Vit D level = 55 during Dec11 OV)... she notes sister dx w/ breast cancer 2010 & she wants to wean off her hormone Rx, & has discussed this w/ Gyn... she gets regular Mammograms & BMDs from [facility]...   Preventive Screening-Counseling & Management  Alcohol-Tobacco     Smoking Status: quit     Year Quit: 1990  Allergies (verified): No Known Drug Allergies  Comments:  Nurse/Medical Assistant: The patient's medications and allergies were reviewed with the patient and were updated in the Medication and Allergy Lists.  Past History:  Past Medical History: CHRONIC RHINITIS (ICD-472.0) HYPERTENSION (ICD-401.9) VENOUS INSUFFICIENCY (ICD-459.81) HYPERCHOLESTEROLEMIA (ICD-272.0) GERD (ICD-530.81) IRRITABLE BOWEL SYNDROME (ICD-564.1) Hx of CARCINOMA, RENAL CELL (ICD-189.0) DEGENERATIVE JOINT DISEASE (ICD-715.90) Hx of HEADACHE (ICD-784.0) ANXIETY (ICD-300.00)  Past Surgical History: S/P right nephrectomy for renal cell CA in 2001 S/P hysterectomy  Family History: Reviewed history from 07/06/2008 and no changes required. Mother alive age 46 Father deceased age 73 from prostate cancer 1 sibling alive age 17 1 sibling alive age 49  1 sbiling alive age 73  Social History: Reviewed history from 04/12/2010 and no changes required. quit smoking 20+ years ago--quit 1990 smoked for 10 years exercises 3 times per week caffeine use:  2 cups per day etoh---1 per month divorced 2 children  Review of Systems      See HPI  The patient denies anorexia, fever, weight loss, weight gain, vision loss, decreased hearing, hoarseness, chest pain, syncope, dyspnea on exertion, peripheral edema, prolonged cough, headaches, hemoptysis, abdominal pain, melena, hematochezia, severe indigestion/heartburn,  hematuria, incontinence, muscle weakness, suspicious skin lesions, transient blindness, difficulty walking, depression, unusual weight change, abnormal bleeding, enlarged lymph nodes, and angioedema.    Vital Signs:  Patient profile:   65 year old female Height:      65 inches Weight:      192.50 pounds BMI:     32.15 O2 Sat:      98 % on Room air Temp:     97.5 degrees F oral Pulse rate:   77 / minute BP sitting:   114 / 74  (left arm) Cuff size:   regular  Vitals Entered By: Randell Loop CMA (October 12, 2010 9:02 AM)  O2 Sat at Rest %:  98 O2 Flow:  Room air CC: 6 month ROV & review of mult medical problems... Is Patient Diabetic? No Pain Assessment Patient in pain? no      Comments meds updated today with pt   Physical Exam  Additional Exam:  WD, Overweight, 64 y/o BF in NAD... GENERAL:  Alert & oriented; pleasant & cooperative... HEENT:  [place]/AT, EOM-wnl, PERRLA, EACs-clear, TMs-wnl, NOSE-clear, THROAT-clear & wnl. NECK:  Supple w/ fairROM; no JVD; normal carotid impulses w/o bruits; no thyromegaly or nodules palpated; no lymphadenopathy. CHEST:  Clear to P & A; without wheezes/ rales/ or rhonchi. HEART:  Regular Rhythm; without murmurs/ rubs/ or gallops. ABDOMEN:  Soft & nontender; normal bowel sounds; no organomegaly or masses detected. EXT: without deformities, mod arthritic changes w/ bilat knee crepitus on exam;  no varicose veins/ +venous insuffic/ no edema. NEURO:  CN's intact; motor testing normal; sensory testing normal; gait normal & balance OK. DERM:  No lesions noted; no rash etc... flower tatoo over right breast area...    MISC. Report  Procedure date:  10/12/2010  Findings:      DAATA REVIEWED:  ~  See CXR, Fasting Labs from 10/12/10 & Mammogram/ BMD from 10/21/10...   Impression & Recommendations:  Problem # 1:  HYPERTENSION (ICD-401.9) Controlled>  same med. Her updated medication list for this problem includes:    Losartan Potassium-hctz  100-12.5 Mg Tabs (Losartan potassium-hctz) .Marland Kitchen... 1/2 by mouth once daily  Orders: T-2 View CXR (71020TC) TLB-BMP (Basic Metabolic Panel-BMET) (80048-METABOL) TLB-Hepatic/Liver Function Pnl (80076-HEPATIC) TLB-CBC Platelet - w/Differential (85025-CBCD) TLB-Lipid Panel (80061-LIPID) TLB-TSH (Thyroid Stimulating Hormone) (84443-TSH) TLB-Udip w/ Micro (81001-URINE)  Problem # 2:  HYPERCHOLESTEROLEMIA (ICD-272.0) Doing satis on the low dose Crestor>  continue same. Her updated medication list for this problem includes:    Crestor 5 Mg Tabs (Rosuvastatin calcium) .Marland Kitchen... Take 1/2 tab by mouth once daily...  Problem # 3:  IRRITABLE BOWEL SYNDROME (ICD-564.1) Generally stable on her herbal remedies but nots some constipation & we discussed OTC Rx..  Problem # 4:  Hx of CARCINOMA, RENAL CELL (ICD-189.0) Stable>  no signs recurrent dis... Orders: T-2 View CXR (71020TC)  Problem # 5:  OTHER MEDICAL PROBLEMS AS NOTED>>> On Vit D 50K from GYN> they decr her to Qod w/ recent level = 55... BMD is WNL.Marland KitchenMarland Kitchen  Complete Medication List: 1)  Adult Aspirin Low Strength 81 Mg Tbdp (Aspirin) .Marland Kitchen.. 1 tab by mouth once daily.Marland KitchenMarland Kitchen 2)  Losartan Potassium-hctz 100-12.5 Mg Tabs (Losartan potassium-hctz) .... 1/2 by mouth once daily 3)  Crestor 5 Mg Tabs (Rosuvastatin calcium) .... Take 1/2 tab by mouth once daily.Marland KitchenMarland Kitchen 4)  Coenzyme Q10 100 Mg Caps (Coenzyme q10) .... Take 1 tablet by mouth once a day 5)  Calcium-vitamin D 500-200 Mg-unit Tabs (Calcium carbonate-vitamin d) .... Take one tablet twice a day 6)  Multivitamins Tabs (Multiple vitamin) .... Take one daily 7)  Vitamin D3 50000 Unit Caps (Cholecalciferol) .... Take one tablet by mouth every other week 8)  Biotin 10 Mg Tabs (Biotin) .... Take 1 tablet by mouth once a day 9)  Evening Primrose Oil 1000 Mg Caps (Evening primrose oil) .... Take 1 tablet by mouth once a day 10)  Alpha Lipoic Acid  .... Take 1 tablet by mouth once a day  Other Orders: Flu Vaccine  69yrs + (95621) Admin 1st Vaccine (30865)  Patient Instructions: 1)  Today we updated your med list- see below.... 2)  We refilled your meds per request... 3)  Today we did your follow up CXR & FASTING blood work... please call the "phone tree" in a few days for your lab results.Marland KitchenMarland Kitchen 4)  Let's get on track w/ our diet + exercise program, the goal is to lose 10-15lbs.... 5)  Today we gave you  the 2011 Flu vaccine... 6)  We will chack your old paper chart to see when your last tetanus shot was given (due every 8yrs)... 7)  Call for any problems...  Prescriptions: CRESTOR 5 MG TABS (ROSUVASTATIN CALCIUM) take 1/2 tab by mouth once daily...  #90 x 4   Entered and Authorized by:   Michele Mcalpine MD   Signed by:   Michele Mcalpine MD on 10/12/2010   Method used:   Print then Give to Patient   RxID:   [id number] LOSARTAN POTASSIUM-HCTZ 100-12.5 MG TABS (LOSARTAN POTASSIUM-HCTZ) 1/2 by mouth once daily  #90 x 4   Entered and Authorized by:   Michele Mcalpine MD   Signed by:   Michele Mcalpine MD on 10/12/2010   Method used:   Print then Give to Patient   RxID:   [id number]    Immunizations Administered:  Influenza Vaccine # 1:    Vaccine Type: Fluvax 3+    Site: left deltoid    Mfr: GlaxoSmithKline    Dose: 0.5 ml    Route: IM    Given by: Randell Loop CMA    Exp. Date: 04/04/2011    Lot #: [id number]    VIS given: 05/17/10 version given October 12, 2010.  Flu Vaccine Consent Questions:    Do you have a history of severe allergic reactions to this vaccine? no    Any prior history of allergic reactions to egg and/or gelatin? no    Do you have a sensitivity to the preservative Thimersol? no    Do you have a past history of Guillan-Barre Syndrome? no    Do you currently have an acute febrile illness? no    Have you ever had a severe reaction to latex? no    Vaccine information given and explained to patient? yes    Are you currently pregnant? no

## 2010-11-30 NOTE — Assessment & Plan Note (Addendum)
Summary: tdap injection--ok per SN   Primary Provider/Referring Provider:  Kriste Basque   History of Present Illness: This note required to document the TDAP given today...  Allergies: No Known Drug Allergies   Complete Medication List: 1)  Adult Aspirin Low Strength 81 Mg Tbdp (Aspirin) .Marland Kitchen.. 1 tab by mouth once daily.Marland KitchenMarland Kitchen 2)  Losartan Potassium-hctz 100-12.5 Mg Tabs (Losartan potassium-hctz) .... 1/2 by mouth once daily 3)  Crestor 5 Mg Tabs (Rosuvastatin calcium) .... Take 1/2 tab by mouth once daily.Marland KitchenMarland Kitchen 4)  Coenzyme Q10 100 Mg Caps (Coenzyme q10) .... Take 1 tablet by mouth once a day 5)  Calcium-vitamin D 500-200 Mg-unit Tabs (Calcium carbonate-vitamin d) .... Take one tablet twice a day 6)  Multivitamins Tabs (Multiple vitamin) .... Take one daily 7)  Vitamin D3 50000 Unit Caps (Cholecalciferol) .... Take one tablet by mouth every other week 8)  Biotin 10 Mg Tabs (Biotin) .... Take 1 tablet by mouth once a day 9)  Evening Primrose Oil 1000 Mg Caps (Evening primrose oil) .... Take 1 tablet by mouth once a day 10)  Alpha Lipoic Acid  .... Take 1 tablet by mouth once a day  Other Orders: Tdap => 24yrs IM (95638) Admin 1st Vaccine (75643)   Immunizations Administered:  Tetanus Vaccine:    Vaccine Type: Tdap    Site: left deltoid    Mfr: GlaxoSmithKline    Dose: 0.5 ml    Route: IM    Given by: Randell Loop CMA    Exp. Date: 08/11/2012    Lot #: [id number]    VIS given: 09/09/08 version given November 23, 2010.

## 2010-11-30 NOTE — Progress Notes (Signed)
Summary: ok to have TdAp  Phone Note Call from Patient   Caller: Patient Call For: Dmoni Fortson Summary of Call: Patient phoned stated that when she was seen last the nurse was going to find out when she had her last Tetnus vaccine and her first grandchild is due in March and if she hasnt had one in ten year she needs to have one. Patient can be reached at  [phone] Initial call taken by: Vedia Coffer,  November 22, 2010 8:54 AM  Follow-up for Phone Call        reviewed pt's paper chart.  nothing documented in pt's chart re: Tetanus vaccine in the last 10 years. Sn, are you ok with pt getting vaccine?  Marland KitchenArman Filter LPN  November 22, 2010 9:49 AM   per SN ok to have Tdap. Carron Curie CMA  November 22, 2010 1:50 PM  LMTCBx1 to advise ok to have tdap vaccine. Carron Curie CMA  November 22, 2010 1:51 PM  pt sheduled to have Tdap tomoorw. Appt set in epic.Carron Curie CMA  November 22, 2010 2:11 PM

## 2011-02-08 ENCOUNTER — Other Ambulatory Visit: Payer: Self-pay | Admitting: *Deleted

## 2011-02-08 MED ORDER — ROSUVASTATIN CALCIUM 5 MG PO TABS: 5.0000 mg | ORAL_TABLET | Freq: Every day | ORAL | Status: DC

## 2011-02-08 MED ORDER — LOSARTAN POTASSIUM-HCTZ 100-12.5 MG PO TABS: 1.0000 | ORAL_TABLET | Freq: Every day | ORAL | Status: DC

## 2011-02-08 NOTE — Telephone Encounter (Signed)
Received refill request from [facility] for pt's crestor 5mg  and hyzaar 100-12.5mg .  Pt last seen by SN 12/11 and told to follow up prn.  rx's sent electronically.

## 2011-03-07 NOTE — Assessment & Plan Note (Signed)
[facility]                               LIPID CLINIC NOTE   KEMBA, HOPPES                       MRN:          [id number]  DATE:10/01/2008                            DOB:          07-26-1946    PAST MEDICAL HISTORY:  1. Hypertension.  2. Hyperlipidemia.  3. Venous insufficiency.  4. Irritable bowel disease.  5. Gastroesophageal reflux disease.  6. History of renal cell carcinoma.  7. Degenerative joint disease.  8. Family history of coronary artery disease.   MEDICATIONS:  1. Avalide unknown dose once a day.  2. Premarin once a day.  3. Multivitamin daily.  4. Fiber supplement daily.  5. Omega-3 fatty acids 1 g daily.  6. Aspirin 81 mg daily.  7. Several herbal supplements on a daily basis including L-carnitine,      GLA herbal supplement with primroses, grapeseed, turmeric, coenzyme      Q10, alpha lipoic acid, chromium, selenium.   VITAL SIGNS:  Weight 181 pounds, blood pressure 124/70, heart rate 80.   LABORATORY DATA:  Total cholesterol 192, triglyceride 91, HDL 49, LDL  125.  LFTs within normal limits, and TSH is 1.9.   ASSESSMENT:  Ms. Abood is a 65 year old female who was referred to  our Lipid Clinic for her hyperlipidemia.  She was previously started on  simvastatin 20 mg daily which she took for 4 months.  She then noticed  her hair starting to fall out and self discontinued her simvastatin.  She decided that she wanted to use herbal products to prevent coronary  artery disease.  She also decided that she would use diet and exercise  to promote healthy lifestyle.  She has stated noncompliance in her  lifestyle modification over the last several months.  She does state  that she would like to be compliant; however, she does state today that  she gets busy and forgets to not go to fast food restaurants and to fit  exercise into her schedule.  She is very hesitant to start any type of  statin therapy.  I have spent a  lengthy period of time speaking to her  about the benefits of statins that although hair loss is a class affect  it is less than 1% risk that despite her having problems with  simvastatin it does not mean that across the board she will have  alopecia with all statins, that they have the most robust data for  preventing myocardial infarction and death from coronary artery disease.  She remains very hesitant of statin therapy.  I have also given her  options of WelChol or Zetia, but keeping in mind that although they do  bring numbers down potentially we do not have the data to say that it  correlates with a decrease in morbidity and mortality.  After this  lengthy conversation that we had in our office today 30 minutes, she is  agreeable to starting a low-dose statin.  We will try Crestor 2.5 mg  daily for 2 months and reassess any type of hair loss at that time as  well as reintroducing our low-fat, low-carbohydrate diet and plenty of  exercise.  When addressing the issue of lifestyle modification, she cuts  of very quickly saying I know everything I need to do.  I just need to  do it.  Her total cholesterol off medication is 192 at goal of less  than 200, triglycerides 91 at goal of less than 150, HDL 49 at goal of  greater than 40, and LDL at 125 I encourage a goal of close to 100 as  possible if not pushing less than 70 given data of benefit of morbidity  and mortality.   PLAN:  1. Crestor 2.5 mg daily.  Samples were given from a month.  Call with      any problems and prescription to be filled thereafter.  Restarted      diet and exercise.  2. Followup visit in 2 months for lipid panel and LFTs.  We will make      changes at that time.      Leota Sauers, PharmD  Electronically Signed      Jesse Sans. Daleen Squibb, MD, [facility]  Electronically Signed   LC/MedQ  DD: 10/01/2008  DT: 10/02/2008  Job #: [id number]

## 2011-03-07 NOTE — Assessment & Plan Note (Signed)
[facility]                         GASTROENTEROLOGY OFFICE NOTE   Heidi [name], Heidi [name]                       MRN:          [id number]  DATE:07/29/2007                            DOB:          1946-08-23    This is a 65 year old African American female that I have evaluated in  the past for constipation associated with abdominal pain and bloating.  She has a diagnosis of constipation-predominant irritable bowel syndrome  and GERD.  She previously underwent colonoscopy in September of 2001  which revealed a normal examination.  She relates ongoing problems with  constipation, lower abdominal pain, gas, and bloating.  She has used  multiple laxatives over the years with only sporadic success.  She notes  she gained some relief with peppermint oil used on a p.r.n. basis.  She  is not taking a fiber supplement or a high fiber diet on a regular  basis.  She notes no change in stool caliber, melena, hematochezia,  weight loss, abdominal pain, or rectal pain.  A maternal aunt has colon  cancer.  No other family members with colon cancer, colon polyps, or  inflammatory bowel disease.   PAST MEDICAL HISTORY:  1. Hypertension.  2. Hyperlipidemia.  3. Status post right nephrectomy for renal cell carcinoma in 2001.  4. Status post partial hysterectomy in the remote past.  5. Status post bilateral tubal ligation in the remote past.  6. Constipation-predominant irritable bowel syndrome.  7. GERD.   CURRENT MEDICATIONS:  Listed on the chart, updated and reviewed.   MEDICATION ALLERGIES:  NONE KNOWN.   SOCIAL HISTORY:  See the handwritten form.   REVIEW OF SYSTEMS:  See the handwritten form.   PHYSICAL EXAMINATION:  GENERAL:  Overweight African American female in  no acute distress.  VITAL SIGNS:  Height 5 feet 5-1/2 inches, weight 170.8 pounds.  Blood  pressure 108/66, pulse 64 and regular.  HEENT:  Anicteric sclerae.  Oropharynx clear.  CHEST:  Clear to  auscultation bilaterally.  CARDIAC:  Regular rate and rhythm without murmurs appreciated.  ABDOMEN:  Soft, nontender, nondistended.  Normoactive bowel sounds.  No  palpable organomegaly, masses, or hernias.  EXTREMITIES:  Without clubbing, cyanosis, or edema.  NEUROLOGIC:  Alert and oriented x3.  Grossly nonfocal.   LABORATORY DATA:  Hepatic function panel, basic metabolic panel, and a  CBC from July 08, 2007 were all normal, except for a minimally  elevated CO2 at 33.   ASSESSMENT AND PLAN:  Constipation-predominant irritable bowel syndrome.  She is to substantially increase her dietary fiber intake and fluid  intake.  Begin Benefiber once daily.  Begin Robinul Forte 1 p.o. b.i.d.  Obtain home stool Hemoccults.  Return office visit in 6-8 weeks.     Venita Lick. Russella Dar, MD, [facility]  Electronically Signed    MTS/MedQ  DD: 07/31/2007  DT: 08/01/2007  Job #: [id number]

## 2011-03-07 NOTE — Assessment & Plan Note (Signed)
[facility]                               LIPID CLINIC [facility]   Heidi Heidi, Heidi Heidi                       MRN:          [id number]  DATE:12/31/2008                            DOB:          [birth year]    She was seen in the [facility] on December 31, 2008.   Heidi Heidi comes in today for followup of her hyperlipidemia therapy,  which includes Crestor 2.5 mg daily and fish oil capsules 1 g daily.  She has been partially compliant with both of these.  She admits to not  taking the fish oil every single day and she is trying to incorporate  more fish into her diet.  She has been tolerating the Crestor just fine  with no muscle aches, pains, and no hair loss which is what she  experienced while on simvastatin.   PHYSICAL EXAMINATION:  VITAL SIGNS:  Her weight is 183 pounds, blood  pressure is 134/68, and heart rate is 68.   LABORATORY DATA:  Total cholesterol 151, triglycerides 78, HDL 53.6, and  LDL 82.  Liver function tests are within normal limits.   ASSESSMENT:  Heidi Heidi is tolerating her medications fine and her  triglycerides, HDL, and LDL are all at goals.  She admits to having  trouble motivating herself to exercise.  She has a pretty stressful job  and is very tired when she gets home.  We talked about how regular  exercise can improve mood and work does Lawyer activity.  She  has been trying to maintain a heart-healthy diet.  She has fish more and  more these days, organic beef or chicken, lots of fresh fruits and  vegetables, nuts, and soy milk.   PLAN:  I am going to continue the same medications.  We encouraged her  with her diet and also to increase her exercise, and she seemed very  motivated to do so.  We are going to follow up with her in 6 months; at  which time, we will repeat the liver and lipid panel and make any  adjustments that are necessary at that time.  This patient was seen in  conjunction with [facility].D. Candidate from [facility].      [name], PharmD  Electronically Signed      Rollene Rotunda, MD, [facility]  Electronically Signed   TP/MedQ  DD: 12/31/2008  DT: 01/01/2009  Job #: [id number]

## 2011-03-07 NOTE — Assessment & Plan Note (Signed)
[facility]                               LIPID CLINIC NOTE   Heidi [name], Heidi [name]                       MRN:          [id number]  DATE:08/20/2008                            DOB:          [birth year]    First office visit for Lipid Clinic.   PAST MEDICAL HISTORY:  Hyperlipidemia, hypertension, venous  insufficiency, irritable bowel disease, gastroesophageal reflux disease,  history of renal cell carcinoma, and degenerative joint disease.   MEDICATIONS:  1. Avalide one tablet daily.  2. Premarin daily.  3. Multivitamin daily.  4. Fiber supplement daily.  5. Omega-3 fatty acid 1 g daily.  6. Carnitine daily.  7. Aspirin 81 mg daily.  8. Primrose herbal supplement daily.  9. Grape seed once daily.  10.Calcium with vitamin D daily.  11.__________ tablet daily.  12.Coenzyme Q10 daily.  13.Alpha-lipoic acid.  14.Chromium daily.  15.Selenium daily.  16.Multivitamin.   PHYSICAL EXAMINATION:  VITAL SIGNS:  Blood pressure 118/70 and heart  rate 80.   LABORATORY DATA:  Total cholesterol 139, triglyceride 74, HDL 47, LDL 77  on simvastatin 20 mg daily.   ASSESSMENT:  Heidi [name] was referred to Lipid Clinic because of  hyperlipidemia.  She states the inability to tolerate simvastatin 20 mg  daily because of hair loss.  She says that she was on simvastatin for  about 4 months when she started noticing that she had increased hair  loss.  She has been off of simvastatin for about 4 weeks now, but has  not noticed any change in the rate of her hair loss.  I have instructed  her to continue with her diet and exercise for next 6 weeks and we will  reassess at that time.  We have spent a lengthy period of our discussion  today talking about the benefit of statin therapy, the reason that we  would need to try to be on the statin therapy, the importance of goal  LDL of less than 100 or as close to 100 as possible as well as healthy  lifestyle.  She  states that she does know all the things that she needs  to do.  She has read several books on healthy lifestyle and the benefits  of diet and exercise and is just a matter of applying this on a daily  basis.  She says that she does a walking video about 30 minutes, 2-3  times a week within this video she walks approximately 3 miles.  She  does understand that she should and will increase to 4-5 times a week of  this exercise video.  She does seems eat a very low-fat, low-  carbohydrate meal, most of the time she eats oatmeal every day for  breakfast with 2 egg whites and a cup of tea.  She normally has a salad  with occasional chicken or fish in it.  She does not eat any fried foods  except on occasion never she goes out with her friends, but even then  she tries to [employer] choices low-fat, low-carbohydrate meals.  I  did caution her about the amount of dressing she use on a salad.  She  also seems to have lot of cheese and I have also encouraged her to be  conscientious of the portion sizes that she eats especially when going  out with friends.  She says that she tries most often to order a baked  or broiled fish and eat a small amount of the sides that come with it.  I have encouraged her to continue this low-fat, low-carbohydrate diet to  set a goal LDL of 100 and to increase her exercise.  She does understand  and is willing to retry statin therapy in the future and we will cross  that bridge as needed at her 6 week visit.   PLAN:  1. Continue low-fat, low-carbohydrate diet.  2. Increase exercise to 4-5 days a week.  3. Hold on statin therapy for now.  We will follow up in 6 weeks for      lipid panel and LFTs as well as thyroid function and other      possibilities to increase hair loss versus statin therapy.      Heidi [name], PharmD  Electronically Signed      Jesse Sans. Daleen Squibb, MD, [facility]  Electronically Signed   LC/MedQ  DD: 08/20/2008  DT: 08/21/2008  Job #:  [id number]

## 2011-04-08 ENCOUNTER — Encounter: Payer: Self-pay | Admitting: Pulmonary Disease

## 2011-04-13 ENCOUNTER — Ambulatory Visit (INDEPENDENT_AMBULATORY_CARE_PROVIDER_SITE_OTHER): Payer: 59 | Admitting: Pulmonary Disease

## 2011-04-13 ENCOUNTER — Encounter: Payer: Self-pay | Admitting: Pulmonary Disease

## 2011-04-13 ENCOUNTER — Other Ambulatory Visit (INDEPENDENT_AMBULATORY_CARE_PROVIDER_SITE_OTHER): Payer: 59

## 2011-04-13 DIAGNOSIS — C649 Malignant neoplasm of unspecified kidney, except renal pelvis: Secondary | ICD-10-CM

## 2011-04-13 DIAGNOSIS — K589 Irritable bowel syndrome without diarrhea: Secondary | ICD-10-CM

## 2011-04-13 DIAGNOSIS — E7800 Pure hypercholesterolemia, unspecified: Secondary | ICD-10-CM

## 2011-04-13 DIAGNOSIS — F411 Generalized anxiety disorder: Secondary | ICD-10-CM

## 2011-04-13 DIAGNOSIS — I10 Essential (primary) hypertension: Secondary | ICD-10-CM

## 2011-04-13 DIAGNOSIS — I872 Venous insufficiency (chronic) (peripheral): Secondary | ICD-10-CM

## 2011-04-13 DIAGNOSIS — M1990 Unspecified osteoarthritis, unspecified site: Secondary | ICD-10-CM

## 2011-04-13 DIAGNOSIS — K219 Gastro-esophageal reflux disease without esophagitis: Secondary | ICD-10-CM

## 2011-04-13 LAB — LIPID PANEL
Cholesterol: 165 mg/dL (ref 0–200)
HDL: 48.2 mg/dL (ref >39.00)
VLDL: 15 mg/dL (ref 0.0–40.0)

## 2011-04-13 MED ORDER — PRAVASTATIN SODIUM 40 MG PO TABS: 40.0000 mg | ORAL_TABLET | Freq: Every evening | ORAL | Status: DC

## 2011-04-13 NOTE — Patient Instructions (Signed)
Today we updated your med list in EPIC>    We decided to change the Crestor to PRAVASTATIN 40mg  - take 1 tab daily at bedtime (it is avail on the [facility]/ [facility] $4 list)  Today we did your follow up fasting blood work...    Please call the PHONE TREE in a few days for your results...    Dial [id number] & when prompted enter your patient number followed by the # symbol...    Your patient number is:  [id number]#  Keep up the good work w/ your diet & exercise program...  Let's plan a follow up visit in 6 months, but call sooner if needed for problems.Marland KitchenMarland Kitchen

## 2011-04-13 NOTE — Progress Notes (Signed)
Subjective:    Patient ID: Heidi [name], female    DOB: [birth year], 65 y.o.   MRN: [id number]  HPI 65 y/o BF here for a follow up visit... she has mult med problems as noted below...   ~  Mar10:  she continues to c/o right knee pain (?injury in [place] last yr)... she was [name]- had XRays & shot, but she wasn't satisfied... she states that she'll try OTC Advil + Tylenol and an exercise program... I rec that she consider a knee brace when walking... otherw doing well without new complaints or concerns... ~  Dec10:  doing well without new complaints... sister dx w/ breast ca & pt concerned about her hormones- slowly weaning down after discussing this w/ Gyn... she's also taking some "powder" and peppermint from her herbalist & her IBS is improved...   ~  April 12, 2010:  her Avalide was changed to Hyzaar per her insurnce co & BP remains well controlled... she has been tolerating & FLP looks good on the Crestor5- 1/2 daily... she reports bowels doing very well on "Daily Cleanse Pill" from the health food store... she's been working out at the gym regularly but weight up 6#, therefore we reviewed diet recommendations.  ~  October 12, 2010:  she's had a good 55mo- just incr stress w/ the holiday & family... she's gained 5# to 193# & we discussed diet + exercise... BP controlled on meds;  tolerating Cres2.5mg /d w/ CoQ10 & due for fasting labs as well;  GI stable & no GU symptoms> due for f/u CXR w/ hx renal cell ca & right nephrectomy 2001;  she saw GYN, [name], w/ Vit D level = 55 by her report & they decr her 50K supplement to [name] & BMD due soon from [name] (done 12/11 & showed normal TScores)... she still takes mult herbs/ supplements "it helps, I'm doing good"...  ~  April 13, 2011:  55mo ROV & she is doing well overall but would like to switch from Crestor which costs her $223 for a 90d supply (we discussed trying PRAVASTATIN 40mg  which should be $10 at [facility] or [facility])...  She  reports wt stable on diet & she is exercising w/ elliptical machine regularly ("it helps my knees")... She is also taking 2 tablesp of flax seeds in yogurt daily according to [name] & she is more regular, less constip, etc... She had BMD 12/11 which was WNL & she continues on Calcium, MVI, Vit D per her GYN (50K every other week)... She had TDAP 2/12 w/ new grandchild delivered...   Problem List:   HYPERTENSION (ICD-401.9) - controlled on ASA 81mg /d, & LOSARTAN/ HCT 100-12.5 taking 1/2 tab daily... BP=128/78 and tol Rx well... she denies HA, fatigue, visual changes, CP, palipit, dizziness, syncope, dyspnea, edema, etc...  VENOUS INSUFFICIENCY (ICD-459.81) - low salt diet, support hose when nec... she reports hammer toe per podiatry but she doesn't want surg...  HYPERCHOLESTEROLEMIA (ICD-272.0) - changed to CRESTOR 5mg - 1/2 tab daily + diet therapy + OTC supplements... We discussed diet +exercise to get weight down... ~  FLP 3/09 showed TChol 198, TG 80, HDL 59, LDL 123... not at goal- start Simvast20/d. ~  FLP 9/09 showed TChol 139, TG 74, HDL 47, LDL 77... doing well on Simva20, but "intol" per pt, refer to [name]. ~  FLP 3/10 on Crestor2.5 showed TChol 151, TG 78, HDL 54, LDL 82... continue same & Lipid Clinic. ~  FLP 9/10 showed TChol 159, TG  64, HDL 54, LDL 93 ~  FLP 12/10 showed TChol 164, TG 74, HDL 51, LDL 98 ~  FLP 3/11 showed TChol 163, TG 73, HDL 56, LDL 92 ~  FLP 12/11 on Cres2.5 showed TChol 172, TG 69, HDL 50, LDL 108... We reviewed diet & exercise... ~  FLP 6/12 on Cres2.5 showed TChol 165, TG 75, HDL 48, LDL 102... she requests change meds due to cost> try PRAV40.  GERD (ICD-530.81) IRRITABLE BOWEL SYNDROME (ICD-564.1) - followed by [name] and notes reviewed... he rec increased fiber, Benefiber, Robinul as needed... she prefers therapy from an herbalist and taking "daily cleanse pill" which helped; now she takes 2tablesp flax seeds in yogurt daily 7 improved... ~  prev colonoscopy 9/01  by [name] was WNL.Marland Kitchen.  ~  f/u colonoscopy 11/09 by [name] was neg- WNL, f/u planned 78yrs...  Hx of CARCINOMA, RENAL CELL (ICD-189.0) - s/p right nephrectomy 2001 at [name] by [name]... he signed off in 2006 and exam/ CXR's have been neg-  ~  f/u CXR 9/09 was clear, WNL... ~  12/10:  we discussed CXR (neg- WNL) & full labs (OK)& she will return for Fasting blood work... ~  12/11:  CXR remains neg- clear, WNL.Marland Kitchen. labs showed   DEGENERATIVE JOINT DISEASE (ICD-715.90) - she's improved w/ exercise program... but c/o bilat R>L knee discomfort w/ eval by Ortho (?who) w/ XRays and shot... doesn't want Rx- using OTC anti-inflamm + Tylenol... She notes that elliptical exercise helps her knees...  Hx of HEADACHE (ICD-784.0)  ANXIETY (ICD-300.00) - she does not wish to have her Alpraz refilled...  Health Maintenance - GYN= [name] and she does PAPs and checked Vit D level on pt (pt reports taking 16109 u Vit D every other day now w/ her Vit D level = 55 during Dec11 OV)... she notes sister dx w/ breast [name] 2010 & she wants to wean off her hormone Rx, & has discussed this w/ Gyn... she gets regular Mammograms & BMDs from [name]> BMD 12/11 was WNL & she remains on Calcium, MVI, Vit D... ~  Immunizations:  Given TDAP here 2/12... Encouraged to get the yearly Flu vaccine...   Past Surgical History  Procedure Date  . Nephrectomy 2001    right = for renal cell CA  . Total abdominal hysterectomy     Outpatient Encounter Prescriptions as of 04/13/2011  Medication Sig Dispense Refill  . ALPHA LIPOIC ACID PO Take by mouth daily.        Marland Kitchen aspirin 81 MG tablet Take 81 mg by mouth daily.        . Biotin 10 MG TABS Take by mouth daily.        . Calcium Carb-Cholecalciferol (CALCIUM 500 +D) 500-400 MG-UNIT TABS Take by mouth 2 (two) times daily.        . Coenzyme Q10 (COQ10) 100 MG CAPS Take by mouth daily.        . ergocalciferol (VITAMIN D2) 50000 UNITS capsule Every other week         . Evening Primrose Oil 1000 MG CAPS Take by mouth daily.        Marland Kitchen losartan-hydrochlorothiazide (HYZAAR) 100-12.5 MG per tablet Take 1 tablet by mouth daily.  90 tablet  3  . Multiple Vitamin (MULTIVITAMIN) capsule Take 1 capsule by mouth daily.        . rosuvastatin (CRESTOR) 5 MG tablet Take by mouth. Take 1/2 tablet by mouth once daily       .  DISCONTD: rosuvastatin (CRESTOR) 5 MG tablet Take 1 tablet (5 mg total) by mouth at bedtime.  90 tablet  3    No Known Allergies   Review of Systems        See HPI - all other systems neg except as noted... The patient denies anorexia, fever, weight loss, weight gain, vision loss, decreased hearing, hoarseness, chest pain, syncope, dyspnea on exertion, peripheral edema, prolonged cough, headaches, hemoptysis, abdominal pain, melena, hematochezia, severe indigestion/heartburn, hematuria, incontinence, muscle weakness, suspicious skin lesions, transient blindness, difficulty walking, depression, unusual weight change, abnormal bleeding, enlarged lymph nodes, and angioedema.     Objective:   Physical Exam     WD, Overweight, 64 y/o BF in NAD... GENERAL:  Alert & oriented; pleasant & cooperative... HEENT:  [place]/AT, EOM-wnl, PERRLA, EACs-clear, TMs-wnl, NOSE-clear, THROAT-clear & wnl. NECK:  Supple w/ fairROM; no JVD; normal carotid impulses w/o bruits; no thyromegaly or nodules palpated; no lymphadenopathy. CHEST:  Clear to P & A; without wheezes/ rales/ or rhonchi. HEART:  Regular Rhythm; without murmurs/ rubs/ or gallops. ABDOMEN:  Soft & nontender; normal bowel sounds; no organomegaly or masses detected. EXT: without deformities, mod arthritic changes w/ bilat knee crepitus on exam;  no varicose veins/ +venous insuffic/ no edema. NEURO:  CN's intact; motor testing normal; sensory testing normal; gait normal & balance OK. DERM:  No lesions noted; no rash etc... flower tatoo over right breast area...   Assessment & Plan:   HBP>  Controlled on  Hyzaar, continue same + diet, exercise, get wt down...  CHOL>  FLP controlled on cres2.5mg  but she requests less expensive alternative; Rx PRAVASTATIN 40mg  Qhs...  GI>  GERD/ IBS>  States her bowels are better on flax seeds in yogurt per [name]...  GU>  s/p right nephrectomy for renal cell ca in 2001; renal funct remains WNL w/ creat 1.0 done 12/11 & CXR remains clear...  DJD>  She does elliptical exercises & states this helps her knees...  Other medical issues as noted.Marland KitchenMarland Kitchen

## 2011-04-15 ENCOUNTER — Encounter: Payer: Self-pay | Admitting: Pulmonary Disease

## 2011-09-26 ENCOUNTER — Other Ambulatory Visit: Payer: Self-pay | Admitting: Pulmonary Disease

## 2011-09-26 DIAGNOSIS — Z1231 Encounter for screening mammogram for malignant neoplasm of breast: Secondary | ICD-10-CM

## 2011-10-12 ENCOUNTER — Ambulatory Visit (INDEPENDENT_AMBULATORY_CARE_PROVIDER_SITE_OTHER): Payer: 59 | Admitting: Pulmonary Disease

## 2011-10-12 ENCOUNTER — Ambulatory Visit (INDEPENDENT_AMBULATORY_CARE_PROVIDER_SITE_OTHER)
Admission: RE | Admit: 2011-10-12 | Discharge: 2011-10-12 | Disposition: A | Discharge status: Home / Self Care | Payer: 59 | Source: Ambulatory Visit | Attending: Pulmonary Disease | Admitting: Pulmonary Disease

## 2011-10-12 ENCOUNTER — Other Ambulatory Visit: Payer: Self-pay | Admitting: Pulmonary Disease

## 2011-10-12 ENCOUNTER — Other Ambulatory Visit (INDEPENDENT_AMBULATORY_CARE_PROVIDER_SITE_OTHER): Payer: 59

## 2011-10-12 ENCOUNTER — Encounter: Payer: Self-pay | Admitting: Pulmonary Disease

## 2011-10-12 DIAGNOSIS — I10 Essential (primary) hypertension: Secondary | ICD-10-CM

## 2011-10-12 DIAGNOSIS — I872 Venous insufficiency (chronic) (peripheral): Secondary | ICD-10-CM

## 2011-10-12 DIAGNOSIS — K589 Irritable bowel syndrome without diarrhea: Secondary | ICD-10-CM

## 2011-10-12 DIAGNOSIS — Z23 Encounter for immunization: Secondary | ICD-10-CM

## 2011-10-12 DIAGNOSIS — C649 Malignant neoplasm of unspecified kidney, except renal pelvis: Secondary | ICD-10-CM

## 2011-10-12 DIAGNOSIS — E7800 Pure hypercholesterolemia, unspecified: Secondary | ICD-10-CM

## 2011-10-12 DIAGNOSIS — M1990 Unspecified osteoarthritis, unspecified site: Secondary | ICD-10-CM

## 2011-10-12 DIAGNOSIS — F411 Generalized anxiety disorder: Secondary | ICD-10-CM

## 2011-10-12 DIAGNOSIS — K219 Gastro-esophageal reflux disease without esophagitis: Secondary | ICD-10-CM

## 2011-10-12 LAB — BASIC METABOLIC PANEL
CO2: 29 mEq/L (ref 19–32)
Calcium: 9.7 mg/dL (ref 8.4–10.5)
Chloride: 106 mEq/L (ref 96–112)
Sodium: 142 mEq/L (ref 135–145)

## 2011-10-12 LAB — URINALYSIS, ROUTINE W REFLEX MICROSCOPIC
Bilirubin Urine: NEGATIVE
Hgb urine dipstick: NEGATIVE
Ketones, ur: NEGATIVE
Urine Glucose: NEGATIVE
Urobilinogen, UA: 0.2 (ref 0.0–1.0)

## 2011-10-12 LAB — TSH: TSH: 1.47 u[IU]/mL (ref 0.35–5.50)

## 2011-10-12 LAB — CBC WITH DIFFERENTIAL/PLATELET
Basophils Absolute: 0 10*3/uL (ref 0.0–0.1)
Eosinophils Absolute: 0.1 10*3/uL (ref 0.0–0.7)
HCT: 40 % (ref 36.0–46.0)
Lymphs Abs: 2 10*3/uL (ref 0.7–4.0)
MCV: 91.2 fl (ref 78.0–100.0)
Monocytes Absolute: 0.4 10*3/uL (ref 0.1–1.0)
Neutrophils Relative %: 53.8 % (ref 43.0–77.0)
Platelets: 273 10*3/uL (ref 150.0–400.0)
RDW: 14.5 % (ref 11.5–14.6)
WBC: 5.6 10*3/uL (ref 4.5–10.5)

## 2011-10-12 LAB — HEPATIC FUNCTION PANEL
ALT: 14 U/L (ref 0–35)
Alkaline Phosphatase: 74 U/L (ref 39–117)
Bilirubin, Direct: 0.1 mg/dL (ref 0.0–0.3)
Total Bilirubin: 0.7 mg/dL (ref 0.3–1.2)
Total Protein: 7.5 g/dL (ref 6.0–8.3)

## 2011-10-12 LAB — LIPID PANEL
LDL Cholesterol: 106 mg/dL — ABNORMAL HIGH (ref 0–99)
Total CHOL/HDL Ratio: 3

## 2011-10-12 MED ORDER — PRAVASTATIN SODIUM 40 MG PO TABS: 40.0000 mg | ORAL_TABLET | Freq: Every evening | ORAL | Status: DC

## 2011-10-12 MED ORDER — LOSARTAN POTASSIUM-HCTZ 100-12.5 MG PO TABS: 1.0000 | ORAL_TABLET | Freq: Every day | ORAL | Status: DC

## 2011-10-12 NOTE — Patient Instructions (Signed)
Today we updated your med list in our EPIC system...    Continue your current medications the same...    We decided to make the switch to PRAVASTATIN 40mg  at bedtime for your cholesterol...  Today we did your follow up CXR & fasting blood work...    Please call the PHONE TREE in a few days for your results...    Dial [id number] & when prompted enter your patient number followed by the # symbol...    Your patient number is:  [id number]#  Keep up the great job w/ your diet & exercise program!!!  Call for any questions...  Let's plan a follow up visit in 6 months w/ fasting Lipid Profile on the Prav40.Marland KitchenMarland Kitchen

## 2011-10-12 NOTE — Progress Notes (Signed)
Subjective:    Patient ID: [name], female    DOB: 1946/08/26, 65 y.o.   MRN: [id number]  HPI 65 y/o BF here for a follow up visit... she has mult med problems as noted below...   ~  Mar10:  she continues to c/o right knee pain (?injury in [place] last yr)... she was seen at [facility]- had XRays & shot, but she wasn't satisfied... she states that she'll try OTC Advil + Tylenol and an exercise program... I rec that she consider a knee brace when walking... otherw doing well without new complaints or concerns... ~  Dec10:  doing well without new complaints... sister dx w/ breast ca & pt concerned about her hormones- slowly weaning down after discussing this w/ Gyn... she's also taking some "powder" and peppermint from her herbalist & her IBS is improved...   ~  April 12, 2010:  her Avalide was changed to Hyzaar per her insurance co & BP remains well controlled... she has been tolerating & FLP looks good on the Crestor5- 1/2 daily... she reports bowels doing very well on "Daily Cleanse Pill" from the health food store... she's been working out at the gym regularly but weight up 6#, therefore we reviewed diet recommendations.  ~  October 12, 2010:  she's had a good 32mo- just incr stress w/ the holiday & family... she's gained 5# to 193# & we discussed diet + exercise... BP controlled on meds;  tolerating Cres2.5mg /d w/ CoQ10 & due for fasting labs as well;  GI stable & no GU symptoms> due for f/u CXR w/ hx renal cell ca & right nephrectomy 2001;  she saw GYN, [name], w/ Vit D level = 55 by her report & they decr her 50K supplement to [facility] & BMD due soon from [facility] (done 12/11 & showed normal TScores)... she still takes mult herbs/ supplements "it helps, I'm doing good"...  ~  April 13, 2011:  32mo ROV & she is doing well overall but would like to switch from Crestor which costs her $223 for a 90d supply (we discussed trying PRAVASTATIN 40mg  which should be $10 at [facility] or [facility])...   She reports wt stable on diet & she is exercising w/ elliptical machine regularly ("it helps my knees")... She is also taking 2 tablesp of flax seeds in yogurt daily according to [name] & she is more regular, less constip, etc... She had BMD 12/11 which was WNL & she continues on Calcium, MVI, Vit D per her GYN (50K every other week)... She had TDAP 2/12 w/ new grandchild delivered...  ~  October 12, 2011:  32mo ROV & she is doing well x occas sinus prob w/ HA> rec to use OTC meds as needed & nasal saline mist...  HBP> on Losartan/Hct 100/12.5 taking 1/2 tab daily; BP= 124/72 & she denies CP, palpit, SOB, edema, etc...  CHOL> still on the Cres2.5, she requested change to Prav40 due to $$ (hasn't filled yet); FLP shows TChol 173, TG 85, HDL 50, LDL 106.  GI> GERD, IBS> she takes Flax seed in Yogurt & states bowels much better since12# wt loss on wt watcher's diet...  Hx Renal cell ca> s/p right nephrectomy 2001; no known recurrence; CXR today showed clear lungs, no lesions, wnl...  DJD> using OTC meds and Tylenol; she had achilles tendonitis- Rx by ?DrHilts she says...   Problem List:   HYPERTENSION (ICD-401.9) - controlled on ASA 81mg /d, & LOSARTAN/ HCT 100-12.5 taking 1/2 tab daily...  ~  6/12: BP=128/78 and tol Rx well... she denies HA, fatigue, visual changes, CP, palipit, dizziness, syncope, dyspnea, edema, etc... ~  12/12: BP= 124/72 & doing well w/o symptoms...  VENOUS INSUFFICIENCY (ICD-459.81) - low salt diet, support hose when nec... she reports hammer toe per podiatry but she doesn't want surg...  HYPERCHOLESTEROLEMIA (ICD-272.0) - on CRESTOR 5mg - 1/2 tab daily + diet therapy + OTC supplements... We discussed diet +exercise to get weight down... ~  FLP 3/09 showed TChol 198, TG 80, HDL 59, LDL 123... not at goal- start Simvast20/d. ~  FLP 9/09 showed TChol 139, TG 74, HDL 47, LDL 77... doing well on Simva20, but "intol" per pt, refer to [facility]. ~  FLP 3/10 on Crestor2.5 showed TChol  151, TG 78, HDL 54, LDL 82... continue same & Lipid Clinic. ~  FLP 9/10 showed TChol 159, TG 64, HDL 54, LDL 93 ~  FLP 12/10 showed TChol 164, TG 74, HDL 51, LDL 98 ~  FLP 3/11 showed TChol 163, TG 73, HDL 56, LDL 92 ~  FLP 12/11 on Cres2.5 showed TChol 172, TG 69, HDL 50, LDL 108... We reviewed diet & exercise... ~  FLP 6/12 on Cres2.5 showed TChol 165, TG 75, HDL 48, LDL 102... she requests change meds due to cost> try PRAV40. ~  FLP 12/12 on Cres2.5 showed TChol 173, TG 85, HDL 50, LDL 106... Hasn't switched to the PRAV40 yet!  GERD (ICD-530.81) IRRITABLE BOWEL SYNDROME (ICD-564.1) - followed by [name] and notes reviewed... he rec increased fiber, Benefiber, Robinul as needed... she prefers therapy from an herbalist and taking "daily cleanse pill" which helped; now she takes 2tablesp flax seeds in yogurt daily 7 improved... ~  prev colonoscopy 9/01 by [name] was WNL.Marland Kitchen.  ~  f/u colonoscopy 11/09 by [name] was neg- WNL, f/u planned 16yrs...  Hx of CARCINOMA, RENAL CELL (ICD-189.0) - s/p right nephrectomy 2001 at [facility]... he signed off in 2006 and exam/ CXR's have been neg-  ~  f/u CXR 9/09 was clear, WNL... ~  12/10:  we discussed CXR (neg- WNL) & full labs (OK)& she will return for Fasting blood work... ~  12/11:  CXR remains neg- clear, WNL.Marland Kitchen. labs showed stable renal function etc... ~  12/12:  CXR remains clear, no lesions & labs similarly normal...  DEGENERATIVE JOINT DISEASE (ICD-715.90) - she's improved w/ exercise program... but c/o bilat R>L knee discomfort w/ eval by Ortho (?who) w/ XRays and shot... doesn't want Rx- using OTC anti-inflamm + Tylenol... She notes that elliptical exercise helps her knees... ~  [facility] TS[facility]...  Hx of HEADACHE (ICD-784.0)  ANXIETY (ICD-300.00) - she does not wish to have her Alpraz refilled...  Health Maintenance - GYN= [name] and she does PAPs and checked Vit D level on pt (pt reports  taking 40981 u Vit D every other day now w/ her Vit D level = 55 during Dec11 OV)... she notes sister dx w/ breast [name] 2010 & she wants to wean off her hormone Rx, & has discussed this w/ Gyn... she gets regular Mammograms & BMD[facility]> BMD 12/11 was WNL & she remains on Calcium, MVI, Vit D... ~  Immunizations:  Given TDAP here 2/12... Encouraged to get the yearly Flu vaccine...   Past Surgical History  Procedure Date  . Nephrectomy 2001    right = for renal cell CA  . Total abdominal hysterectomy     Outpatient Encounter Prescriptions as  of 10/12/2011  Medication Sig Dispense Refill  . ALPHA LIPOIC ACID PO Take by mouth daily.        Marland Kitchen aspirin 81 MG tablet Take 81 mg by mouth daily.        . Biotin 10 MG TABS Take by mouth daily.        . Calcium Carb-Cholecalciferol (CALCIUM 500 +D) 500-400 MG-UNIT TABS Take by mouth 2 (two) times daily.        . Coenzyme Q10 (COQ10) 100 MG CAPS Take by mouth daily.        . ergocalciferol (VITAMIN D2) 50000 UNITS capsule Every other week       . Evening Primrose Oil 1000 MG CAPS Take by mouth daily.        Marland Kitchen losartan-hydrochlorothiazide (HYZAAR) 100-12.5 MG per tablet Take 1 tablet by mouth daily.  90 tablet  3  . Multiple Vitamin (MULTIVITAMIN) capsule Take 1 capsule by mouth daily.        . pravastatin (PRAVACHOL) 40 MG tablet Take 1 tablet (40 mg total) by mouth every evening.  90 tablet  3    No Known Allergies   Current Medications, Allergies, Past Medical History, Past Surgical History, Family History, and Social History were reviewed in [employer] record.    Review of Systems        See HPI - all other systems neg except as noted... The patient denies anorexia, fever, weight loss, weight gain, vision loss, decreased hearing, hoarseness, chest pain, syncope, dyspnea on exertion, peripheral edema, prolonged cough, headaches, hemoptysis, abdominal pain, melena, hematochezia, severe indigestion/heartburn,  hematuria, incontinence, muscle weakness, suspicious skin lesions, transient blindness, difficulty walking, depression, unusual weight change, abnormal bleeding, enlarged lymph nodes, and angioedema.     Objective:   Physical Exam     WD, Overweight, 65 y/o BF in NAD... GENERAL:  Alert & oriented; pleasant & cooperative... HEENT:  [place]/AT, EOM-wnl, PERRLA, EACs-clear, TMs-wnl, NOSE-clear, THROAT-clear & wnl. NECK:  Supple w/ fairROM; no JVD; normal carotid impulses w/o bruits; no thyromegaly or nodules palpated; no lymphadenopathy. CHEST:  Clear to P & A; without wheezes/ rales/ or rhonchi. HEART:  Regular Rhythm; without murmurs/ rubs/ or gallops. ABDOMEN:  Soft & nontender; normal bowel sounds; no organomegaly or masses detected. EXT: without deformities, mod arthritic changes w/ bilat knee crepitus on exam;  no varicose veins/ +venous insuffic/ no edema. NEURO:  CN's intact; motor testing normal; sensory testing normal; gait normal & balance OK. DERM:  No lesions noted; no rash etc... flower tatoo over right breast area...   Assessment & Plan:   HBP>  Controlled on Hyzaar, continue same + diet, exercise, get wt down...  CHOL>  FLP controlled on Cres2.5mg  but she requests less expensive alternative; Rx PRAVASTATIN 40mg  Qhs...  GI>  GERD/ IBS>  States her bowels are better on flax seeds in yogurt per [name]...  GU>  s/p right nephrectomy for renal cell ca in 2001; renal funct remains WNL w/ Creat ~1.0 & CXR remains clear...  DJD>  She does elliptical exercises & states this helps her knees...  Other medical issues as noted.Marland KitchenMarland Kitchen

## 2011-10-13 LAB — VITAMIN D 25 HYDROXY (VIT D DEFICIENCY, FRACTURES): Vit D, 25-Hydroxy: 49 ng/mL (ref 30–89)

## 2011-10-21 ENCOUNTER — Encounter: Payer: Self-pay | Admitting: Pulmonary Disease

## 2011-11-01 ENCOUNTER — Ambulatory Visit (HOSPITAL_COMMUNITY)
Admission: RE | Admit: 2011-11-01 | Discharge: 2011-11-01 | Disposition: A | Discharge status: Home / Self Care | Payer: 59 | Source: Ambulatory Visit | Attending: Pulmonary Disease | Admitting: Pulmonary Disease

## 2011-11-01 DIAGNOSIS — Z1231 Encounter for screening mammogram for malignant neoplasm of breast: Secondary | ICD-10-CM | POA: Insufficient documentation

## 2012-04-11 ENCOUNTER — Encounter: Payer: Self-pay | Admitting: Pulmonary Disease

## 2012-04-11 ENCOUNTER — Other Ambulatory Visit (INDEPENDENT_AMBULATORY_CARE_PROVIDER_SITE_OTHER): Payer: Medicare Other

## 2012-04-11 ENCOUNTER — Ambulatory Visit (INDEPENDENT_AMBULATORY_CARE_PROVIDER_SITE_OTHER): Payer: 59 | Admitting: Pulmonary Disease

## 2012-04-11 VITALS — BP 118/68 | HR 56 | Temp 97.2°F | Ht 65.0 in | Wt 186.2 lb

## 2012-04-11 DIAGNOSIS — M1990 Unspecified osteoarthritis, unspecified site: Secondary | ICD-10-CM

## 2012-04-11 DIAGNOSIS — C649 Malignant neoplasm of unspecified kidney, except renal pelvis: Secondary | ICD-10-CM

## 2012-04-11 DIAGNOSIS — K219 Gastro-esophageal reflux disease without esophagitis: Secondary | ICD-10-CM

## 2012-04-11 DIAGNOSIS — I10 Essential (primary) hypertension: Secondary | ICD-10-CM

## 2012-04-11 DIAGNOSIS — F411 Generalized anxiety disorder: Secondary | ICD-10-CM

## 2012-04-11 DIAGNOSIS — K589 Irritable bowel syndrome without diarrhea: Secondary | ICD-10-CM

## 2012-04-11 DIAGNOSIS — E7800 Pure hypercholesterolemia, unspecified: Secondary | ICD-10-CM

## 2012-04-11 DIAGNOSIS — Z23 Encounter for immunization: Secondary | ICD-10-CM

## 2012-04-11 LAB — BASIC METABOLIC PANEL
BUN: 14 mg/dL (ref 6–23)
CO2: 28 mEq/L (ref 19–32)
GFR: 66.19 mL/min (ref >60.00)
Glucose, Bld: 86 mg/dL (ref 70–99)
Potassium: 4.3 mEq/L (ref 3.5–5.1)
Sodium: 142 mEq/L (ref 135–145)

## 2012-04-11 LAB — LIPID PANEL
Cholesterol: 189 mg/dL (ref 0–200)
VLDL: 11.2 mg/dL (ref 0.0–40.0)

## 2012-04-11 NOTE — Progress Notes (Signed)
Subjective:    Patient ID: Heidi [name], female    DOB: 05/08/1946, 66 y.o.   MRN: [id number]  HPI 66 y/o BF here for a follow up visit... she has mult med problems as noted below...   ~  October 12, 2010:  she's had a good 87mo- just incr stress w/ the holiday & family... she's gained 5# to 193# & we discussed diet + exercise... BP controlled on meds;  tolerating Cres2.5mg /d w/ CoQ10 & due for fasting labs as well;  GI stable & no GU symptoms> due for f/u CXR w/ hx renal cell ca & right nephrectomy 2001;  she saw GYN, [name], w/ Vit D level = 55 by her report & they decr her 50K supplement to [facility] & BMD due soon from [facility] (done 12/11 & showed normal TScores)... she still takes mult herbs/ supplements "it helps, I'm doing good"...  ~  April 13, 2011:  87mo ROV & she is doing well overall but would like to switch from Crestor which costs her $223 for a 90d supply (we discussed trying PRAVASTATIN 40mg  which should be $10 at [facility] or [facility])...  She reports wt stable on diet & she is exercising w/ elliptical machine regularly ("it helps my knees")... She is also taking 2 tablesp of flax seeds in yogurt daily according to [name] & she is more regular, less constip, etc... She had BMD 12/11 which was WNL & she continues on Calcium, MVI, Vit D per her GYN (50K every other week)... She had TDAP 2/12 w/ new grandchild delivered...  ~  October 12, 2011:  87mo ROV & she is doing well x occas sinus prob w/ HA> rec to use OTC meds as needed & nasal saline mist... HBP> on Losartan/Hct 100/12.5 taking 1/2 tab daily; BP= 124/72 & she denies CP, palpit, SOB, edema, etc... CHOL> still on the Cres2.5, she requested change to Prav40 due to $$ (hasn't filled yet); FLP shows TChol 173, TG 85, HDL 50, LDL 106. GI> GERD, IBS> she takes Flax seed in Yogurt & states bowels much better since12# wt loss on wt watcher's diet... Hx Renal cell ca> s/p right nephrectomy 2001; no known recurrence; CXR today showed clear  lungs, no lesions, wnl... DJD> using OTC meds and Tylenol; she had achilles tendonitis- Rx by ?DrHilts she says...  ~  April 11, 2012:  87mo ROV & Heidi [name] has had a good interval, no new complaints or concerns; she turns 66 next week 7 we will give her the Pneumonia vaccine today... HBP> on Losartan/Hct 100/12.5 ?taking 1/2 tab daily, but all med lists read 1 tab/d; BP= 118/68 & she denies CP, palpit, SOB, dizzy, edema, etc... CHOL> on Prav40 now & tol well; FLP showed TChol 189, TG 56, HDL 53, LDL 125 GI> GERD, IBS> she takes Flax seed in Yogurt & states bowels much better; she also take a number of supplements per [name]... Hx Renal cell ca> s/p right nephrectomy 2001; no known recurrence; renal function remains normal w/ BUM=14, Creat=1.1 DJD> using OTC meds and Tylenol; rec to continue exercise program & home therapy...    We reviewed prob list, meds, xrays and labs> see below>> LABS 6/13:  FLP- LDL=125 on Prav40;  BMet- wnl... Note: reminded to take Prav40 every night & get on better low chol diet, get wt down...   Problem List:   HYPERTENSION (ICD-401.9) - controlled on ASA 81mg /d, & LOSARTAN/ HCT 100-12.5 taking 1/2 tab daily...  ~  6/12: BP=128/78 and  tol Rx well... she denies HA, fatigue, visual changes, CP, palipit, dizziness, syncope, dyspnea, edema, etc... ~  12/12: BP= 124/72 & doing well w/o symptoms... ~  6/13:  BP= 118/68 & she denies CP, palpit, SOB, dizzy, edema, etc...  VENOUS INSUFFICIENCY (ICD-459.81) - low salt diet, support hose when nec... she reports hammer toe per podiatry but she doesn't want surg...  HYPERCHOLESTEROLEMIA (ICD-272.0) - now on PRAVASTATIN 40mg /d + diet therapy + OTC supplements... We discussed diet +exercise to get weight down... ~  FLP 3/09 showed TChol 198, TG 80, HDL 59, LDL 123... not at goal- start Simvast20/d. ~  FLP 9/09 showed TChol 139, TG 74, HDL 47, LDL 77... doing well on Simva20, but "intol" per pt, refer to [facility]. ~  FLP 3/10 on Crestor2.5  showed TChol 151, TG 78, HDL 54, LDL 82... continue same & Lipid Clinic. ~  FLP 9/10 showed TChol 159, TG 64, HDL 54, LDL 93 ~  FLP 12/10 showed TChol 164, TG 74, HDL 51, LDL 98 ~  FLP 3/11 showed TChol 163, TG 73, HDL 56, LDL 92 ~  FLP 12/11 on Cres2.5 showed TChol 172, TG 69, HDL 50, LDL 108... We reviewed diet & exercise... ~  FLP 6/12 on Cres2.5 showed TChol 165, TG 75, HDL 48, LDL 102... she requests change meds due to cost> try PRAV40. ~  FLP 12/12 on Cres2.5 showed TChol 173, TG 85, HDL 50, LDL 106... Hasn't switched to the PRAV40 yet! ~  FLP 6/13 on Prav40 showed TChol 189, TG 56, HDL 53, LDL 125... rec to take Qhs, get on diet, get wt down...  GERD (ICD-530.81) IRRITABLE BOWEL SYNDROME (ICD-564.1) - followed by [name] and notes reviewed... he rec increased fiber, Benefiber, Robinul as needed... she prefers therapy from an herbalist and taking "daily cleanse pill" which helped; now she takes 2tablesp flax seeds in yogurt daily & improved... ~  prev colonoscopy 9/01 by [name] was WNL.Marland Kitchen.  ~  f/u colonoscopy 11/09 by [name] was neg- WNL, f/u planned 48yrs...  Hx of CARCINOMA, RENAL CELL (ICD-189.0) - s/p right nephrectomy 2001 at [facility] by [facility]... he signed off in 2006 and exam/ CXR's have been neg-  ~  f/u CXR 9/09 was clear, WNL... ~  12/10:  we discussed CXR (neg- WNL) & full labs (OK)& she will return for Fasting blood work... ~  12/11:  CXR remains neg- clear, WNL... Renal function normal w/ BUN=14, Creat=1.0 ~  12/12:  CXR remains clear, no lesions & labs similarly normal... Renal function normal w/ BUN=16, Creat=1.1  DEGENERATIVE JOINT DISEASE (ICD-715.90) - she's improved w/ exercise program... but c/o bilat R>L knee discomfort w/ eval by Ortho (?who) w/ XRays and shot... doesn't want Rx- using OTC anti-inflamm + Tylenol... She notes that elliptical exercise helps her knees... ~  [facility]- all wnl...  Hx of HEADACHE  (ICD-784.0)  ANXIETY (ICD-300.00) - she does not wish to have her Alpraz refilled...  Health Maintenance - GYN= [name] and she does PAPs and checked Vit D level on pt (pt reports taking 40981 u Vit D every other day now w/ her Vit D level = 55 during Dec11 OV)... she notes sister dx w/ breast [name] 2010 & she wants to wean off her hormone Rx, & has discussed this w/ Gyn... she gets regular Mammograms & [facility]> BMD 12/11 was WNL & she remains on Calcium, MVI, Vit D... ~  Immunizations:  Given TDAP here  2/12... Encouraged to get the yearly Flu vaccine... Given PNEUMOVAX 6/13 (66th birthday)...   Past Surgical History  Procedure Date  . Nephrectomy 2001    right = for renal cell CA  . Total abdominal hysterectomy     Outpatient Encounter Prescriptions as of 04/11/2012  Medication Sig Dispense Refill  . ALPHA LIPOIC ACID PO Take by mouth daily.        Marland Kitchen aspirin 81 MG tablet Take 81 mg by mouth daily.        . Biotin 10 MG TABS Take by mouth daily.        . Calcium Carb-Cholecalciferol (CALCIUM 500 +D) 500-400 MG-UNIT TABS Take by mouth 2 (two) times daily.        . Coenzyme Q10 (COQ10) 100 MG CAPS Take by mouth daily.        . ergocalciferol (VITAMIN D2) 50000 UNITS capsule Every other week       . Evening Primrose Oil 1000 MG CAPS Take by mouth daily.        Marland Kitchen losartan-hydrochlorothiazide (HYZAAR) 100-12.5 MG per tablet Take 1 tablet by mouth daily.  90 tablet  3  . Multiple Vitamin (MULTIVITAMIN) capsule Take 1 capsule by mouth daily.        . pravastatin (PRAVACHOL) 40 MG tablet Take 1 tablet (40 mg total) by mouth every evening.  90 tablet  3    No Known Allergies   Current Medications, Allergies, Past Medical History, Past Surgical History, Family History, and Social History were reviewed in [employer] record.    Review of Systems        See HPI - all other systems neg except as noted... The patient denies anorexia, fever, weight  loss, weight gain, vision loss, decreased hearing, hoarseness, chest pain, syncope, dyspnea on exertion, peripheral edema, prolonged cough, headaches, hemoptysis, abdominal pain, melena, hematochezia, severe indigestion/heartburn, hematuria, incontinence, muscle weakness, suspicious skin lesions, transient blindness, difficulty walking, depression, unusual weight change, abnormal bleeding, enlarged lymph nodes, and angioedema.     Objective:   Physical Exam     WD, Overweight, 65 y/o BF in NAD... GENERAL:  Alert & oriented; pleasant & cooperative... HEENT:  [place]/AT, EOM-wnl, PERRLA, EACs-clear, TMs-wnl, NOSE-clear, THROAT-clear & wnl. NECK:  Supple w/ fairROM; no JVD; normal carotid impulses w/o bruits; no thyromegaly or nodules palpated; no lymphadenopathy. CHEST:  Clear to P & A; without wheezes/ rales/ or rhonchi. HEART:  Regular Rhythm; without murmurs/ rubs/ or gallops. ABDOMEN:  Soft & nontender; normal bowel sounds; no organomegaly or masses detected. EXT: without deformities, mod arthritic changes w/ bilat knee crepitus on exam;  no varicose veins/ +venous insuffic/ no edema. NEURO:  CN's intact; motor testing normal; sensory testing normal; gait normal & balance OK. DERM:  No lesions noted; no rash etc... flower tatoo over right breast area...  RADIOLOGY DATA:  Reviewed in the EPIC EMR & discussed w/ the patient...  LABORATORY DATA:  Reviewed in the EPIC EMR & discussed w/ the patient...   Assessment & Plan:   HBP>  Controlled on Hyzaar, continue same + diet, exercise, get wt down...  CHOL>  FLP fair on PRAVASTATIN 40mg  Qhs w/ LDL up to 125; rec to take every day, get on diet, get wt down...  GI>  GERD/ IBS>  States her bowels are better on flax seeds in yogurt per [name]...  GU>  s/p right nephrectomy for renal cell ca in 2001; renal funct remains WNL w/ Creat ~1.1 & CXR remains  clear...  DJD>  She does elliptical exercises & states this helps her knees...  Other medical  issues as noted...   Patient's Medications  New Prescriptions   No medications on file  Previous Medications   ALPHA LIPOIC ACID PO    Take by mouth daily.     ASPIRIN 81 MG TABLET    Take 81 mg by mouth daily.     BIOTIN 10 MG TABS    Take by mouth daily.     CALCIUM CARB-CHOLECALCIFEROL (CALCIUM 500 +D) 500-400 MG-UNIT TABS    Take by mouth 2 (two) times daily.     COENZYME Q10 (COQ10) 100 MG CAPS    Take by mouth daily.     ERGOCALCIFEROL (VITAMIN D2) 50000 UNITS CAPSULE    Every other week    EVENING PRIMROSE OIL 1000 MG CAPS    Take by mouth daily.     LOSARTAN-HYDROCHLOROTHIAZIDE (HYZAAR) 100-12.5 MG PER TABLET    Take 1 tablet by mouth daily.   MULTIPLE VITAMIN (MULTIVITAMIN) CAPSULE    Take 1 capsule by mouth daily.     PRAVASTATIN (PRAVACHOL) 40 MG TABLET    Take 1 tablet (40 mg total) by mouth every evening.  Modified Medications   No medications on file  Discontinued Medications   No medications on file

## 2012-04-11 NOTE — Patient Instructions (Addendum)
Today we updated your med list in our EPIC system...    Continue your current medications the same...  Today we did your targeted labs> Lipid panel & Metabolic panel...    We will call you w/ the results...  We also gave you the PNEUMONIA vaccine>    Current indication is one shot at age 65...  Let's get on track w/ our diet & exercise program....  Call for any problems...  Let's plan a routine follow up visit in 6 months.Marland KitchenMarland Kitchen

## 2012-04-13 ENCOUNTER — Encounter: Payer: Self-pay | Admitting: Pulmonary Disease

## 2012-10-11 ENCOUNTER — Encounter: Payer: Self-pay | Admitting: Pulmonary Disease

## 2012-10-11 ENCOUNTER — Ambulatory Visit (INDEPENDENT_AMBULATORY_CARE_PROVIDER_SITE_OTHER): Payer: 59 | Admitting: Pulmonary Disease

## 2012-10-11 ENCOUNTER — Other Ambulatory Visit (INDEPENDENT_AMBULATORY_CARE_PROVIDER_SITE_OTHER): Payer: 59

## 2012-10-11 ENCOUNTER — Ambulatory Visit (INDEPENDENT_AMBULATORY_CARE_PROVIDER_SITE_OTHER)
Admission: RE | Admit: 2012-10-11 | Discharge: 2012-10-11 | Disposition: A | Discharge status: Home / Self Care | Payer: Medicare Other | Source: Ambulatory Visit | Attending: Pulmonary Disease | Admitting: Pulmonary Disease

## 2012-10-11 VITALS — BP 118/74 | HR 68 | Temp 97.7°F | Ht 65.0 in | Wt 185.4 lb

## 2012-10-11 DIAGNOSIS — Z0000 Encounter for general adult medical examination without abnormal findings: Secondary | ICD-10-CM

## 2012-10-11 DIAGNOSIS — M1990 Unspecified osteoarthritis, unspecified site: Secondary | ICD-10-CM

## 2012-10-11 DIAGNOSIS — E7800 Pure hypercholesterolemia, unspecified: Secondary | ICD-10-CM

## 2012-10-11 DIAGNOSIS — R51 Headache: Secondary | ICD-10-CM

## 2012-10-11 DIAGNOSIS — I10 Essential (primary) hypertension: Secondary | ICD-10-CM

## 2012-10-11 DIAGNOSIS — K589 Irritable bowel syndrome without diarrhea: Secondary | ICD-10-CM

## 2012-10-11 DIAGNOSIS — I872 Venous insufficiency (chronic) (peripheral): Secondary | ICD-10-CM

## 2012-10-11 DIAGNOSIS — K219 Gastro-esophageal reflux disease without esophagitis: Secondary | ICD-10-CM

## 2012-10-11 DIAGNOSIS — Z23 Encounter for immunization: Secondary | ICD-10-CM

## 2012-10-11 DIAGNOSIS — C649 Malignant neoplasm of unspecified kidney, except renal pelvis: Secondary | ICD-10-CM

## 2012-10-11 LAB — CBC WITH DIFFERENTIAL/PLATELET
Basophils Relative: 0.4 % (ref 0.0–3.0)
Eosinophils Absolute: 0.1 10*3/uL (ref 0.0–0.7)
HCT: 39.2 % (ref 36.0–46.0)
Hemoglobin: 13.1 g/dL (ref 12.0–15.0)
Lymphocytes Relative: 31.7 % (ref 12.0–46.0)
MCHC: 33.5 g/dL (ref 30.0–36.0)
MCV: 89.4 fl (ref 78.0–100.0)
Monocytes Absolute: 0.5 10*3/uL (ref 0.1–1.0)
Neutro Abs: 4 10*3/uL (ref 1.4–7.7)
RBC: 4.39 Mil/uL (ref 3.87–5.11)

## 2012-10-11 LAB — URINALYSIS, ROUTINE W REFLEX MICROSCOPIC
Hgb urine dipstick: NEGATIVE
Total Protein, Urine: NEGATIVE
Urine Glucose: NEGATIVE

## 2012-10-11 LAB — BASIC METABOLIC PANEL
Calcium: 9.7 mg/dL (ref 8.4–10.5)
Creatinine, Ser: 1.1 mg/dL (ref 0.4–1.2)

## 2012-10-11 LAB — LIPID PANEL
HDL: 48.7 mg/dL (ref >39.00)
LDL Cholesterol: 106 mg/dL — ABNORMAL HIGH (ref 0–99)
Total CHOL/HDL Ratio: 3
Triglycerides: 71 mg/dL (ref 0.0–149.0)

## 2012-10-11 LAB — HEPATIC FUNCTION PANEL
Bilirubin, Direct: 0.1 mg/dL (ref 0.0–0.3)
Total Bilirubin: 0.8 mg/dL (ref 0.3–1.2)

## 2012-10-11 MED ORDER — LOSARTAN POTASSIUM-HCTZ 100-12.5 MG PO TABS: 1.0000 | ORAL_TABLET | Freq: Every day | ORAL | Status: DC

## 2012-10-11 MED ORDER — PRAVASTATIN SODIUM 40 MG PO TABS: 40.0000 mg | ORAL_TABLET | Freq: Every evening | ORAL | Status: DC

## 2012-10-11 NOTE — Progress Notes (Signed)
Subjective:    Patient ID: Baird Cancer, female    DOB: 1946/03/22, 66 y.o.   MRN: [id number]  HPI 66 y/o BF here for a follow up visit... she has mult med problems as noted below...  SEE PREV EPIC NOTES FOR EARLIER DATA >>  ~  October 12, 2011:  5mo ROV & she is doing well x occas sinus prob w/ HA> rec to use OTC meds as needed & nasal saline mist...    HBP> on Losartan/Hct 100/12.5 taking 1/2 tab daily; BP= 124/72 & she denies CP, palpit, SOB, edema, etc...    CHOL> still on the Cres2.5, she requested change to Prav40 due to $$ (hasn't filled yet); FLP shows TChol 173, TG 85, HDL 50, LDL 106.    GI> GERD, IBS> she takes Flax seed in Yogurt & states bowels much better since12# wt loss on wt watcher's diet...    Hx Renal cell ca> s/p right nephrectomy 2001; no known recurrence; CXR today showed clear lungs, no lesions, wnl...    DJD> using OTC meds and Tylenol; she had achilles tendonitis- Rx by ?DrHilts she says...  ~  April 11, 2012:  5mo ROV & Leighla has had a good interval, no new complaints or concerns; she turns 66 next week 7 we will give her the Pneumonia vaccine today...    HBP> on Losartan/Hct 100/12.5 ?taking 1/2 tab daily, but all med lists read 1 tab/d; BP= 118/68 & she denies CP, palpit, SOB, dizzy, edema, etc...    CHOL> on Prav40 now & tol well; FLP showed TChol 189, TG 56, HDL 53, LDL 125    GI> GERD, IBS> she takes Flax seed in Yogurt & states bowels much better; she also take a number of supplements per [name]...    Hx Renal cell ca> s/p right nephrectomy 2001; no known recurrence; renal function remains normal w/ BUM=14, Creat=1.1    DJD> using OTC meds and Tylenol; rec to continue exercise program & home therapy...    We reviewed prob list, meds, xrays and labs> see below>> LABS 6/13:  FLP- LDL=125 on Prav40;  BMet- wnl... Note: reminded to take Prav40 every night & get on better low chol diet, get wt down...  ~  October 11, 2012:  5mo ROV & her CC= sinus HA, frontal  area, congested, draining, w/o f/s/c; states improved w/ OTC (coffee & Advil) & she declines my offer to rx w/ antibiotic, cortisone, pain med, etc... Also her sis says sleep apnea- offered sleep study but she declines (wakes refreshed & denies daytime sleepiness)...     HBP> on ASA 81, Losartan/Hct 100/12.5;  BP= 118/74 & she denies CP, palpit, SOB, dizzy, edema, etc...    Ven Insuffic> reminded to elim salt, elev legs, wear support hose...    CHOL> on Prav40 now & tol well; FLP showed TChol 169, TG 71, HDL 49, LDL 106    GI> GERD, IBS> she takes Flax seed in Yogurt & Align; states bowels much better- denies n/v, c/d, blood seen; she also take a number of supplements per [name]...    Hx Renal cell ca> s/p right nephrectomy 2001; no known recurrence; renal function remains normal w/ BUN=15, Creat=1.1    DJD> using OTC meds and Tylenol; rec to continue exercise program & home therapy...    Vit D defic> on VitD50K every other week per Gyn;   We reviewed prob list, meds, xrays and labs> see below for updates >> we gave her the 2013 flu  vaccine today... CXR 12/13 showed normal heart size, clear lungs, NAD.Marland Kitchen EKG 12/13 showed SBrady, rate54, WNL, NAD... LABS 12/13:  FLP- ok on Prav40 w/ LDL=106;  Chems- wnl;  CBC- wnl;  TSH=1.20;  UA=ok...         Problem List:   HYPERTENSION (ICD-401.9) - controlled on ASA 81mg /d, & LOSARTAN/ HCT 100-12.5 taking 1/2 tab daily...  ~  6/12: BP=128/78 and tol Rx well... she denies HA, fatigue, visual changes, CP, palipit, dizziness, syncope, dyspnea, edema, etc... ~  12/12: BP= 124/72 & doing well w/o symptoms... ~  6/13:  BP= 118/68 & she denies CP, palpit, SOB, dizzy, edema, etc... ~  12/13: on ASA 81, Losartan/Hct 100/12.5;  BP= 118/74 & she denies CP, palpit, SOB, dizzy, edema, etc...   VENOUS INSUFFICIENCY (ICD-459.81) - low salt diet, support hose when nec... she reports hammer toe per podiatry but she doesn't want surg...  HYPERCHOLESTEROLEMIA (ICD-272.0) -  now on PRAVASTATIN 40mg /d + diet therapy + OTC supplements... We discussed diet +exercise to get weight down... ~  FLP 3/09 showed TChol 198, TG 80, HDL 59, LDL 123... not at goal- start Simvast20/d. ~  FLP 9/09 showed TChol 139, TG 74, HDL 47, LDL 77... doing well on Simva20, but "intol" per pt, refer to [facility]. ~  FLP 3/10 on Crestor2.5 showed TChol 151, TG 78, HDL 54, LDL 82... continue same & Lipid Clinic. ~  FLP 9/10 showed TChol 159, TG 64, HDL 54, LDL 93 ~  FLP 12/10 showed TChol 164, TG 74, HDL 51, LDL 98 ~  FLP 3/11 showed TChol 163, TG 73, HDL 56, LDL 92 ~  FLP 12/11 on Cres2.5 showed TChol 172, TG 69, HDL 50, LDL 108... We reviewed diet & exercise... ~  FLP 6/12 on Cres2.5 showed TChol 165, TG 75, HDL 48, LDL 102... she requests change meds due to cost> try PRAV40. ~  FLP 12/12 on Cres2.5 showed TChol 173, TG 85, HDL 50, LDL 106... Hasn't switched to the PRAV40 yet! ~  FLP 6/13 on Prav40 showed TChol 189, TG 56, HDL 53, LDL 125... rec to take Qhs, get on diet, get wt down... ~  FLP 12/13 on Prav40 showed TChol 169, TG 71, HDL 49, LDL 106   GERD (ICD-530.81) IRRITABLE BOWEL SYNDROME (ICD-564.1) - followed by [name] and notes reviewed... he rec increased fiber, Benefiber, Robinul as needed... she prefers therapy from an herbalist and taking "daily cleanse pill" which helped; now she takes 2tablesp flax seeds in yogurt daily & improved... ~  prev colonoscopy 9/01 by [name] was WNL.Marland Kitchen.  ~  f/u colonoscopy 11/09 by [name] was neg- WNL, f/u planned 45yrs...  Hx of CARCINOMA, RENAL CELL (ICD-189.0) - s/p right nephrectomy 2001 at [facility]... he signed off in 2006 and exam/ CXR's have been neg-  ~  f/u CXR 9/09 was clear, WNL... ~  12/10:  we discussed CXR (neg- WNL) & full labs (OK)& she will return for Fasting blood work... ~  12/11:  CXR remains neg- clear, WNL... Renal function normal w/ BUN=14, Creat=1.0 ~  12/12:  CXR remains clear, no lesions & labs similarly  normal... Renal function normal w/ BUN=16, Creat=1.1  DEGENERATIVE JOINT DISEASE (ICD-715.90) - she's improved w/ exercise program... but c/o bilat R>L knee discomfort w/ eval by Ortho (?who) w/ XRays and shot... doesn't want Rx- using OTC anti-inflamm + Tylenol... She notes that elliptical exercise helps her knees... ~  [facility] TS[facility]- all wnl.Marland KitchenMarland Kitchen  Hx of HEADACHE (ICD-784.0)  ANXIETY (ICD-300.00) - she does not wish to have her Alpraz refilled...  Health Maintenance - GYN= [name] and she does PAPs and checked Vit D level on pt (pt reports taking 16109 u Vit D every other day now w/ her Vit D level = 55 during Dec11 OV)... she notes sister dx w/ breast cancer 2010 & she wants to wean off her hormone Rx, & has discussed this w/ Gyn... she gets regular Mammograms & [facility]> BMD 12/11 was WNL & she remains on Calcium, MVI, Vit D... ~  Immunizations:  Given TDAP here 2/12... Encouraged to get the yearly Flu vaccine... Given PNEUMOVAX 6/13 (66th birthday)...   Past Surgical History  Procedure Date  . Nephrectomy 2001    right = for renal cell CA  . Total abdominal hysterectomy     Outpatient Encounter Prescriptions as of 10/11/2012  Medication Sig Dispense Refill  . ALPHA LIPOIC ACID PO Take by mouth daily.        Marland Kitchen aspirin 81 MG tablet Take 81 mg by mouth daily.        . Biotin 10 MG TABS Take by mouth daily.        . Calcium Carb-Cholecalciferol (CALCIUM 500 +D) 500-400 MG-UNIT TABS Take by mouth 2 (two) times daily.        . Coenzyme Q10 (COQ10) 100 MG CAPS Take by mouth daily.        . ergocalciferol (VITAMIN D2) 50000 UNITS capsule Every other week       . Evening Primrose Oil 1000 MG CAPS Take by mouth daily.        Marland Kitchen losartan-hydrochlorothiazide (HYZAAR) 100-12.5 MG per tablet Take 1 tablet by mouth daily.  90 tablet  3  . Multiple Vitamin (MULTIVITAMIN) capsule Take 1 capsule by mouth daily.        . pravastatin (PRAVACHOL) 40 MG tablet  Take 1 tablet (40 mg total) by mouth every evening.  90 tablet  3    No Known Allergies   Current Medications, Allergies, Past Medical History, Past Surgical History, Family History, and Social History were reviewed in [employer] record.    Review of Systems        See HPI - all other systems neg except as noted... The patient denies anorexia, fever, weight loss, weight gain, vision loss, decreased hearing, hoarseness, chest pain, syncope, dyspnea on exertion, peripheral edema, prolonged cough, headaches, hemoptysis, abdominal pain, melena, hematochezia, severe indigestion/heartburn, hematuria, incontinence, muscle weakness, suspicious skin lesions, transient blindness, difficulty walking, depression, unusual weight change, abnormal bleeding, enlarged lymph nodes, and angioedema.     Objective:   Physical Exam     WD, Overweight, 66 y/o BF in NAD... GENERAL:  Alert & oriented; pleasant & cooperative... HEENT:  [place]/AT, EOM-wnl, PERRLA, EACs-clear, TMs-wnl, NOSE-clear, THROAT-clear & wnl. NECK:  Supple w/ fairROM; no JVD; normal carotid impulses w/o bruits; no thyromegaly or nodules palpated; no lymphadenopathy. CHEST:  Clear to P & A; without wheezes/ rales/ or rhonchi. HEART:  Regular Rhythm; without murmurs/ rubs/ or gallops. ABDOMEN:  Soft & nontender; normal bowel sounds; no organomegaly or masses detected. EXT: without deformities, mod arthritic changes w/ bilat knee crepitus on exam;  no varicose veins/ +venous insuffic/ no edema. NEURO:  CN's intact; motor testing normal; sensory testing normal; gait normal & balance OK. DERM:  No lesions noted; no rash etc... flower tatoo over right breast area...  RADIOLOGY DATA:  Reviewed in  the EPIC EMR & discussed w/ the patient...  LABORATORY DATA:  Reviewed in the EPIC EMR & discussed w/ the patient...   Assessment & Plan:    HBP>  Controlled on Hyzaar, continue same + diet, exercise, get wt down...  CHOL>   FLP fair on PRAVASTATIN 40mg  Qhs w/ LDL improved to 106; rec to take every day, get on diet, get wt down...  GI>  GERD/ IBS>  States her bowels are better on flax seeds in yogurt per [name]...  GU>  s/p right nephrectomy for renal cell ca in 2001; renal funct remains WNL w/ Creat ~1.1 & CXR remains clear...  DJD>  She does elliptical exercises & states this helps her knees...  Other medical issues as noted...   Patient's Medications  New Prescriptions   No medications on file  Previous Medications   ALPHA LIPOIC ACID PO    Take by mouth daily.     ASPIRIN 81 MG TABLET    Take 81 mg by mouth daily.     BIOTIN 10 MG TABS    Take by mouth daily.     CALCIUM CARB-CHOLECALCIFEROL (CALCIUM 500 +D) 500-400 MG-UNIT TABS    Take by mouth 2 (two) times daily.     COENZYME Q10 (COQ10) 100 MG CAPS    Take by mouth daily.     ERGOCALCIFEROL (VITAMIN D2) 50000 UNITS CAPSULE    Every other week    EVENING PRIMROSE OIL 1000 MG CAPS    Take by mouth daily.     LOSARTAN-HYDROCHLOROTHIAZIDE (HYZAAR) 100-12.5 MG PER TABLET    Take 1 tablet by mouth daily.   MULTIPLE VITAMIN (MULTIVITAMIN) CAPSULE    Take 1 capsule by mouth daily.     PRAVASTATIN (PRAVACHOL) 40 MG TABLET    Take 1 tablet (40 mg total) by mouth every evening.  Modified Medications   No medications on file  Discontinued Medications   No medications on file

## 2012-10-11 NOTE — Patient Instructions (Addendum)
Today we updated your med list in our EPIC system...    Continue your current medications the same...    We refilled your meds per request...  Today we did your follow up CXR, EKG, & FASTING blood work...    We will contact you w/ the results when avail...  We gave you the 2013 Flu vaccine today...  Call for any questions.Marland KitchenMarland Kitchen

## 2012-10-17 NOTE — Progress Notes (Signed)
Quick Note:  Patient returned call. Advised of lab results / recs as stated by SN. Pt verbalized understanding and denied any questions. ______ 

## 2012-10-17 NOTE — Progress Notes (Signed)
Quick Note:  Patient returned call. Advised of lab results / recs as stated by TP. Pt verbalized understanding and denied any questions. ______ 

## 2012-10-24 ENCOUNTER — Other Ambulatory Visit: Payer: Self-pay | Admitting: Pulmonary Disease

## 2012-10-24 DIAGNOSIS — Z803 Family history of malignant neoplasm of breast: Secondary | ICD-10-CM

## 2012-10-24 DIAGNOSIS — Z1231 Encounter for screening mammogram for malignant neoplasm of breast: Secondary | ICD-10-CM

## 2012-11-06 ENCOUNTER — Ambulatory Visit (HOSPITAL_COMMUNITY): Admission: RE | Admit: 2012-11-06 | Payer: 59 | Source: Ambulatory Visit

## 2012-11-13 ENCOUNTER — Ambulatory Visit (HOSPITAL_COMMUNITY)
Admission: RE | Admit: 2012-11-13 | Discharge: 2012-11-13 | Disposition: A | Discharge status: Home / Self Care | Payer: Medicare Other | Source: Ambulatory Visit | Attending: Pulmonary Disease | Admitting: Pulmonary Disease

## 2012-11-13 DIAGNOSIS — Z803 Family history of malignant neoplasm of breast: Secondary | ICD-10-CM

## 2012-11-13 DIAGNOSIS — Z1231 Encounter for screening mammogram for malignant neoplasm of breast: Secondary | ICD-10-CM | POA: Insufficient documentation

## 2013-04-09 ENCOUNTER — Ambulatory Visit (INDEPENDENT_AMBULATORY_CARE_PROVIDER_SITE_OTHER): Payer: Medicare Other | Admitting: Pulmonary Disease

## 2013-04-09 ENCOUNTER — Encounter: Payer: Self-pay | Admitting: Pulmonary Disease

## 2013-04-09 VITALS — BP 124/82 | HR 58 | Temp 97.2°F | Ht 65.0 in | Wt 191.0 lb

## 2013-04-09 DIAGNOSIS — M1990 Unspecified osteoarthritis, unspecified site: Secondary | ICD-10-CM

## 2013-04-09 DIAGNOSIS — I10 Essential (primary) hypertension: Secondary | ICD-10-CM

## 2013-04-09 DIAGNOSIS — E7800 Pure hypercholesterolemia, unspecified: Secondary | ICD-10-CM

## 2013-04-09 DIAGNOSIS — C649 Malignant neoplasm of unspecified kidney, except renal pelvis: Secondary | ICD-10-CM

## 2013-04-09 DIAGNOSIS — K219 Gastro-esophageal reflux disease without esophagitis: Secondary | ICD-10-CM

## 2013-04-09 DIAGNOSIS — K589 Irritable bowel syndrome without diarrhea: Secondary | ICD-10-CM

## 2013-04-09 DIAGNOSIS — I872 Venous insufficiency (chronic) (peripheral): Secondary | ICD-10-CM

## 2013-04-09 DIAGNOSIS — F411 Generalized anxiety disorder: Secondary | ICD-10-CM

## 2013-04-09 NOTE — Progress Notes (Signed)
Subjective:    Patient ID: Heidi [name], female    DOB: 1945-10-30, 67 y.o.   MRN: [id number]  HPI 67 y/o BF here for a follow up visit... she has mult med problems as noted below...  SEE PREV EPIC NOTES FOR EARLIER DATA >>  ~  October 12, 2011:  77mo ROV & she is doing well x occas sinus prob w/ HA> rec to use OTC meds as needed & nasal saline mist...    HBP> on Losartan/Hct 100/12.5 taking 1/2 tab daily; BP= 124/72 & she denies CP, palpit, SOB, edema, etc...    CHOL> still on the Cres2.5, she requested change to Prav40 due to $$ (hasn't filled yet); FLP shows TChol 173, TG 85, HDL 50, LDL 106.    GI> GERD, IBS> she takes Flax seed in Yogurt & states bowels much better since12# wt loss on wt watcher's diet...    Hx Renal cell ca> s/p right nephrectomy 2001; no known recurrence; CXR today showed clear lungs, no lesions, wnl...    DJD> using OTC meds and Tylenol; she had achilles tendonitis- Rx by ?DrHilts she says...  ~  April 11, 2012:  77mo ROV & Heidi [name] has had a good interval, no new complaints or concerns; she turns 66 next week 7 we will give her the Pneumonia vaccine today...    HBP> on Losartan/Hct 100/12.5 ?taking 1/2 tab daily, but all med lists read 1 tab/d; BP= 118/68 & she denies CP, palpit, SOB, dizzy, edema, etc...    CHOL> on Prav40 now & tol well; FLP showed TChol 189, TG 56, HDL 53, LDL 125    GI> GERD, IBS> she takes Flax seed in Yogurt & states bowels much better; she also take a number of supplements per [name]...    Hx Renal cell ca> s/p right nephrectomy 2001; no known recurrence; renal function remains normal w/ BUM=14, Creat=1.1    DJD> using OTC meds and Tylenol; rec to continue exercise program & home therapy...    We reviewed prob list, meds, xrays and labs> see below>> LABS 6/13:  FLP- LDL=125 on Prav40;  BMet- wnl... Note: reminded to take Prav40 every night & get on better low chol diet, get wt down...  ~  October 11, 2012:  77mo ROV & her CC= sinus HA, frontal  area, congested, draining, w/o f/s/c; states improved w/ OTC (coffee & Advil) & she declines my offer to rx w/ antibiotic, cortisone, pain med, etc... Also her sis says sleep apnea- offered sleep study but she declines (wakes refreshed & denies daytime sleepiness)...    HBP> on ASA 81, Losartan/Hct 100/12.5-1/2;  BP= 118/74 & she denies CP, palpit, SOB, dizzy, edema, etc...    Ven Insuffic> reminded to elim salt, elev legs, wear support hose...    CHOL> on Prav40-1/2 now & tol well; FLP showed TChol 169, TG 71, HDL 49, LDL 106    GI> GERD, IBS> she takes Flax seed in Yogurt & Align; states bowels much better- denies n/v, c/d, blood seen; she also take a number of supplements per [name]...    Hx Renal cell ca> s/p right nephrectomy 2001; no known recurrence; renal function remains normal w/ BUN=15, Creat=1.1    DJD> using OTC meds and Tylenol; rec to continue exercise program & home therapy...    Vit D defic> on VitD50K every other week per Gyn;  We reviewed prob list, meds, xrays and labs> see below for updates >> we gave her the 2013 flu vaccine today.Marland KitchenMarland Kitchen  CXR 12/13 showed normal heart size, clear lungs, NAD.Marland Kitchen EKG 12/13 showed SBrady, rate54, WNL, NAD... LABS 12/13:  FLP- ok on Prav40 w/ LDL=106;  Chems- wnl;  CBC- wnl;  TSH=1.20;  UA=ok...  ~  April 09, 2013:  37mo ROV & Heidi [name] is doing well & she had a good interval w/o new complaints or concerns; recently treated by Podiatry for left achilles tendonitis w/ 2 shots and pred, rec for PT but she's doing her own exercises; note that she's gained 6# & asked to consider bike or other non-weight-bearing exercise + diet etc... We reviewed the following medical problems during today's office visit >>     HBP> on ASA 81, Losartan/Hct 100/12.5-1/2 tab;  BP= 124/82 & she denies CP, palpit, SOB, dizzy, edema, etc...    Ven Insuffic> reminded to elim salt, elev legs, wear support hose, etc...    CHOL> on Prav40-1/2tab & tol well; FLP 12/13 showed TChol 169, TG 71,  HDL 49, LDL 106    Overweight> wt is up 6# to 191#, BMI= 31-2, but she is excited about new juicer 7 she is doing this for 31meal/d...    GI> GERD, IBS> she takes Flax seed in Yogurt & Align; states bowels much better- denies n/v, c/d, blood seen; she also take a number of supplements per [name]...    Hx Renal cell ca> s/p right nephrectomy 2001; no known recurrence; renal function remains normal w/ labs 12/13 showing BUN=15, Creat=1.1    DJD> using OTC meds and Tylenol; rec to continue exercise program & home therapy; developed achilles tendonitis 5/14 w/ shots & Pred per Podiatry.    Vit D defic> on VitD50K every other week per Gyn... We reviewed prob list, meds, xrays and labs> see below for updates >>           Problem List:   HYPERTENSION (ICD-401.9) - controlled on ASA 81mg /d, & LOSARTAN/ HCT 100-12.5 taking 1/2 tab daily...  ~  6/12: BP=128/78 and tol Rx well... she denies HA, fatigue, visual changes, CP, palipit, dizziness, syncope, dyspnea, edema, etc... ~  12/12: BP= 124/72 & doing well w/o symptoms... ~  6/13:  BP= 118/68 & she denies CP, palpit, SOB, dizzy, edema, etc... ~  12/13: on ASA 81, Losartan/Hct 100/12.5-1/2;  BP= 118/74 & she denies CP, palpit, SOB, dizzy, edema, etc...  ~  6/14: on ASA 81, Losartan/Hct 100/12.5-1/2 tab;  BP= 124/82 & she denies CP, palpit, SOB, dizzy, edema, etc  VENOUS INSUFFICIENCY (ICD-459.81) - low salt diet, support hose when nec... she reports hammer toe per podiatry but she doesn't want surg...  HYPERCHOLESTEROLEMIA (ICD-272.0) - We discussed diet +exercise to get weight down... ~  FLP 3/09 showed TChol 198, TG 80, HDL 59, LDL 123... not at goal- start Simvast20/d. ~  FLP 9/09 showed TChol 139, TG 74, HDL 47, LDL 77... doing well on Simva20, but "intol" per pt, refer to [facility]. ~  FLP 3/10 on Crestor2.5 showed TChol 151, TG 78, HDL 54, LDL 82... continue same & [facility]. ~  FLP 9/10 showed TChol 159, TG 64, HDL 54, LDL 93 ~  FLP 12/10 showed TChol  164, TG 74, HDL 51, LDL 98 ~  FLP 3/11 showed TChol 163, TG 73, HDL 56, LDL 92 ~  FLP 12/11 on Cres2.5 showed TChol 172, TG 69, HDL 50, LDL 108... We reviewed diet & exercise... ~  FLP 6/12 on Cres2.5 showed TChol 165, TG 75, HDL 48, LDL 102... she requests change meds due to  cost> try PRAV40 (she decided on 1/2) ~  FLP 12/12 on Cres2.5 showed TChol 173, TG 85, HDL 50, LDL 106... Hasn't switched to the PRAV40 yet! ~  FLP 6/13 on Prav40-1/2 showed TChol 189, TG 56, HDL 53, LDL 125... rec to take Qhs, get on diet, get wt down... ~  FLP 12/13 on Prav40-1/2 showed TChol 169, TG 71, HDL 49, LDL 106   GERD (ICD-530.81) IRRITABLE BOWEL SYNDROME (ICD-564.1) - followed by [name] and notes reviewed... he rec increased fiber, Benefiber, Robinul as needed... she prefers therapy from an herbalist and taking "daily cleanse pill" which helped; now she takes 2tablesp flax seeds in yogurt daily & improved... ~  prev colonoscopy 9/01 by [name] was WNL.Marland Kitchen.  ~  f/u colonoscopy 11/09 by [name] was neg- WNL, f/u planned 42yrs...  Hx of CARCINOMA, RENAL CELL (ICD-189.0) - s/p right nephrectomy 2001 at [facility] by [employer]... he signed off in 2006 and exam/ CXR's have been neg-  ~  f/u CXR 9/09 was clear, WNL... ~  12/10:  we discussed CXR (neg- WNL) & full labs (OK)& she will return for Fasting blood work... ~  12/11:  CXR remains neg- clear, WNL... Renal function normal w/ BUN=14, Creat=1.0 ~  12/12:  CXR remains clear, no lesions & labs similarly normal... Renal function normal w/ BUN=16, Creat=1.1  DEGENERATIVE JOINT DISEASE (ICD-715.90) - she's improved w/ exercise program... but c/o bilat R>L knee discomfort w/ eval by Ortho (?who) w/ XRays and shot... doesn't want Rx- using OTC anti-inflamm + Tylenol... She notes that elliptical exercise helps her knees... ~  [facility]- all wnl...  Hx of HEADACHE (ICD-784.0)  ANXIETY (ICD-300.00) - she does not wish to have her Alpraz  refilled...  Health Maintenance - GYN= [name] and she does PAPs and checked Vit D level on pt (pt reports taking [id number] u Vit D every other day now w/ her Vit D level = 55 during Dec11 OV)... she notes sister dx w/ breast [name] 2010 & she wants to wean off her hormone Rx, & has discussed this w/ Gyn... she gets regular Mammograms & BMD[facility]> BMD 12/11 was WNL & she remains on Calcium, MVI, Vit D... ~  Immunizations:  Given TDAP here 2/12... Encouraged to get the yearly Flu vaccine... Given PNEUMOVAX 6/13 (66th birthday)...   Past Surgical History  Procedure Laterality Date  . Nephrectomy  2001    right = for renal cell CA  . Total abdominal hysterectomy      Outpatient Encounter Prescriptions as of 04/09/2013  Medication Sig Dispense Refill  . ALPHA LIPOIC ACID PO Take by mouth daily.        Marland Kitchen aspirin 81 MG tablet Take 81 mg by mouth daily.        . Biotin 10 MG TABS Take by mouth daily.        . Calcium Carb-Cholecalciferol (CALCIUM 500 +D) 500-400 MG-UNIT TABS Take by mouth 2 (two) times daily.        . Coenzyme Q10 (COQ10) 100 MG CAPS Take by mouth daily.        . ergocalciferol (VITAMIN D2) 50000 UNITS capsule Every other week       . Evening Primrose Oil 1000 MG CAPS Take by mouth daily.        Marland Kitchen losartan-hydrochlorothiazide (HYZAAR) 100-12.5 MG per tablet Take 1/2 tablet by mouth daily      . Multiple Vitamin (MULTIVITAMIN) capsule Take 1 capsule  by mouth daily.        . pravastatin (PRAVACHOL) 40 MG tablet Take 1/2 tablet by mouth daily      . [DISCONTINUED] losartan-hydrochlorothiazide (HYZAAR) 100-12.5 MG per tablet Take 1 tablet by mouth daily.  90 tablet  3  . [DISCONTINUED] pravastatin (PRAVACHOL) 40 MG tablet Take 1 tablet (40 mg total) by mouth every evening.  90 tablet  3   No facility-administered encounter medications on file as of 04/09/2013.    No Known Allergies   Current Medications, Allergies, Past Medical History, Past Surgical History, Family  History, and Social History were reviewed in [employer] record.    Review of Systems        See HPI - all other systems neg except as noted... The patient denies anorexia, fever, weight loss, weight gain, vision loss, decreased hearing, hoarseness, chest pain, syncope, dyspnea on exertion, peripheral edema, prolonged cough, headaches, hemoptysis, abdominal pain, melena, hematochezia, severe indigestion/heartburn, hematuria, incontinence, muscle weakness, suspicious skin lesions, transient blindness, difficulty walking, depression, unusual weight change, abnormal bleeding, enlarged lymph nodes, and angioedema.     Objective:   Physical Exam     WD, Overweight, 66 y/o BF in NAD... GENERAL:  Alert & oriented; pleasant & cooperative... HEENT:  [place]/AT, EOM-wnl, PERRLA, EACs-clear, TMs-wnl, NOSE-clear, THROAT-clear & wnl. NECK:  Supple w/ fairROM; no JVD; normal carotid impulses w/o bruits; no thyromegaly or nodules palpated; no lymphadenopathy. CHEST:  Clear to P & A; without wheezes/ rales/ or rhonchi. HEART:  Regular Rhythm; without murmurs/ rubs/ or gallops. ABDOMEN:  Soft & nontender; normal bowel sounds; no organomegaly or masses detected. EXT: without deformities, mod arthritic changes w/ bilat knee crepitus on exam;  no varicose veins/ +venous insuffic/ no edema. NEURO:  CN's intact; motor testing normal; sensory testing normal; gait normal & balance OK. DERM:  No lesions noted; no rash etc... flower tatoo over right breast area...  RADIOLOGY DATA:  Reviewed in the EPIC EMR & discussed w/ the patient...  LABORATORY DATA:  Reviewed in the EPIC EMR & discussed w/ the patient...   Assessment & Plan:    HBP>  Controlled on Hyzaar, continue same + diet, exercise, get wt down...  CHOL>  FLP fair on PRAVASTATIN 40mg -1/2 w/ LDL improved to 106; she is not inclined to incr med to 1 tab daily, therefore continue same + better diet...  GI>  GERD/ IBS>  States her  bowels are better on flax seeds in yogurt per [name]...  GU>  s/p right nephrectomy for renal cell ca in 2001; renal funct remains WNL w/ Creat ~1.1 & CXR remains clear...  DJD>  She does elliptical exercises & states this helps her knees...  Other medical issues as noted...   Patient's Medications  New Prescriptions   No medications on file  Previous Medications   ALPHA LIPOIC ACID PO    Take by mouth daily.     ASPIRIN 81 MG TABLET    Take 81 mg by mouth daily.     BIOTIN 10 MG TABS    Take by mouth daily.     CALCIUM CARB-CHOLECALCIFEROL (CALCIUM 500 +D) 500-400 MG-UNIT TABS    Take by mouth 2 (two) times daily.     COENZYME Q10 (COQ10) 100 MG CAPS    Take by mouth daily.     ERGOCALCIFEROL (VITAMIN D2) 50000 UNITS CAPSULE    Every other week    EVENING PRIMROSE OIL 1000 MG CAPS    Take by mouth  daily.     MULTIPLE VITAMIN (MULTIVITAMIN) CAPSULE    Take 1 capsule by mouth daily.    Modified Medications   Modified Medication Previous Medication   LOSARTAN-HYDROCHLOROTHIAZIDE (HYZAAR) 100-12.5 MG PER TABLET losartan-hydrochlorothiazide (HYZAAR) 100-12.5 MG per tablet      Take 1/2 tablet by mouth daily    Take 1 tablet by mouth daily.   PRAVASTATIN (PRAVACHOL) 40 MG TABLET pravastatin (PRAVACHOL) 40 MG tablet      Take 1/2 tablet by mouth daily    Take 1 tablet (40 mg total) by mouth every evening.  Discontinued Medications   No medications on file

## 2013-04-09 NOTE — Patient Instructions (Addendum)
Today we updated your med list in our EPIC system...    Continue your current medications the same...  Call for any questions...  Let's plan a follow up visit in 6mo, sooner if needed for problems...   

## 2013-04-11 ENCOUNTER — Ambulatory Visit: Payer: Medicare Other | Admitting: Pulmonary Disease

## 2013-04-17 ENCOUNTER — Telehealth: Payer: Self-pay | Admitting: Pulmonary Disease

## 2013-04-17 MED ORDER — AZITHROMYCIN 250 MG PO TABS: ORAL_TABLET | ORAL | Status: DC

## 2013-04-17 NOTE — Telephone Encounter (Signed)
Spoke with pt and notified of recs per SN She verbalized understanding and nothing further needed Rx was sent to pharm

## 2013-04-17 NOTE — Telephone Encounter (Signed)
Spoke with pt  She is c/o non prod cough, chills, sweats, and rhinitis x 2 days Taking otc decongestant and otc cough drops with no relief Would like something called to pharm  Please advise, thanks Last ov 04/09/13 Next ov 10/13/13 No Known Allergies

## 2013-04-17 NOTE — Telephone Encounter (Signed)
LMTCB- need to verify pharm 

## 2013-04-17 NOTE — Telephone Encounter (Signed)
Per SN--  Call in zpak #1  Take as directed mucinex 2 po bid and increase fluids Nasal saline as needed

## 2013-04-29 ENCOUNTER — Telehealth: Payer: Self-pay | Admitting: Nurse Practitioner

## 2013-04-29 NOTE — Telephone Encounter (Signed)
Patient wants adivce on  libido

## 2013-04-29 NOTE — Telephone Encounter (Signed)
Patient calling with concerns of decrease Libido. Last AEX 11/2012 . Appointment given with [name], FNP, for July 11th @ 8:45am.

## 2013-05-02 ENCOUNTER — Encounter: Payer: Self-pay | Admitting: Nurse Practitioner

## 2013-05-02 ENCOUNTER — Ambulatory Visit (INDEPENDENT_AMBULATORY_CARE_PROVIDER_SITE_OTHER): Payer: Medicare Other | Admitting: Nurse Practitioner

## 2013-05-02 VITALS — BP 124/68 | HR 72 | Resp 12 | Ht 65.5 in | Wt 186.0 lb

## 2013-05-02 DIAGNOSIS — R6882 Decreased libido: Secondary | ICD-10-CM

## 2013-05-02 NOTE — Patient Instructions (Addendum)
OTC herbal products may be helpful. There is also RX testosterone cream that may be helpful.

## 2013-05-02 NOTE — Progress Notes (Signed)
Subjective:     Patient ID: Heidi [name], female   DOB: 1946/08/20, 67 y.o.   MRN: [id number]  HPI67 yo SAA Fe G3,P2 here to discuss low libido.  Her long time partner lives in [place] and they see each other maybe 1 x week.  She has trouble getting interested in sex and having an orgasm.  He is concerned that they may have a problem.  She has investigated some literature online and feels that she needs more information. Social history:  She now has a new grand baby who is 34 months old and wants to spend time with the family. 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 PMH: recently having problems with a bone spur in foot and taking cortisone injections and a dose pack.  She has been unable to exercise. currently having URI symptoms and taking antibiotics and cough med's.  She states she has no interest because she has not felt well. She is also on HTN med's but no SSRI's.   Review of Systems  Constitutional: Positive for fatigue. Negative for fever, appetite change and unexpected weight change.  Respiratory: Negative.   Cardiovascular: Negative.   Gastrointestinal: Negative.   Endocrine: Negative.   Genitourinary: Negative.  Negative for dysuria, urgency, vaginal pain, menstrual problem, pelvic pain and dyspareunia.  Musculoskeletal: Positive for joint swelling and gait problem.  Skin: Negative.   Neurological: Negative for dizziness, syncope, weakness, numbness and headaches.  Psychiatric/Behavioral: Negative.        Objective:   Physical Exam  Constitutional: She is oriented to person, place, and time. She appears well-developed and well-nourished.  Exam not indicated today.  Neurological: She is alert and oriented to person, place, and time.  Psychiatric: She has a normal mood and affect. Her behavior is normal. Judgment and thought content normal.        Assessment:     Decreased libido postmenopausal -  ERT dc 09/2009    Plan:     Discussed use of testosterone cream that is specially compounded with potential side effects of increased hair growth, deepening of voice, and needing lab monitoring. also discussed using information from an Herbalist that may be helpful in finding a 'natural' remedy for libido - but no FDA approved. Also discussed her current lifestyle with being very busy at work and with new grand child and having more things to do. She will consider these options and then decide which is best for her - but also work on decreasing stressors.

## 2013-05-03 NOTE — Progress Notes (Signed)
Encounter reviewed by Dr. Brook Silva.  

## 2013-05-07 ENCOUNTER — Telehealth: Payer: Self-pay | Admitting: Pulmonary Disease

## 2013-05-07 MED ORDER — CIPROFLOXACIN HCL 250 MG PO TABS: 250.0000 mg | ORAL_TABLET | Freq: Two times a day (BID) | ORAL | Status: DC

## 2013-05-07 NOTE — Telephone Encounter (Signed)
Last OV 04-09-13. I spoke with the pt and she states x 5 days she has been having a foul odor with her urine and a burning sensation when she urinates. Pt denies any abdominal pain, low back pain, fever, n/v. Please advise. Carron Curie, CMA No Known Allergies

## 2013-05-07 NOTE — Telephone Encounter (Signed)
Per SN---  Call in cipro 250 mg #14  1 po BID  With no refills. thanks

## 2013-05-07 NOTE — Telephone Encounter (Signed)
Spouse aware RX called in. Nothing further was needed

## 2013-06-13 ENCOUNTER — Encounter: Payer: Self-pay | Admitting: Gastroenterology

## 2013-06-27 ENCOUNTER — Encounter: Payer: Self-pay | Admitting: Gastroenterology

## 2013-08-26 ENCOUNTER — Ambulatory Visit: Payer: Medicare Other

## 2013-08-27 ENCOUNTER — Ambulatory Visit (INDEPENDENT_AMBULATORY_CARE_PROVIDER_SITE_OTHER): Payer: Medicare Other

## 2013-08-27 DIAGNOSIS — Z23 Encounter for immunization: Secondary | ICD-10-CM

## 2013-08-29 ENCOUNTER — Ambulatory Visit (AMBULATORY_SURGERY_CENTER): Payer: Self-pay

## 2013-08-29 VITALS — Ht 65.75 in | Wt 183.0 lb

## 2013-08-29 DIAGNOSIS — Z1211 Encounter for screening for malignant neoplasm of colon: Secondary | ICD-10-CM

## 2013-08-29 MED ORDER — MOVIPREP 100 G PO SOLR: 1.0000 | Freq: Once | ORAL | Status: DC

## 2013-09-08 ENCOUNTER — Encounter: Payer: Medicare Other | Admitting: Gastroenterology

## 2013-09-10 ENCOUNTER — Encounter: Payer: Self-pay | Admitting: Gastroenterology

## 2013-09-10 ENCOUNTER — Ambulatory Visit (AMBULATORY_SURGERY_CENTER): Payer: Medicare Other | Admitting: Gastroenterology

## 2013-09-10 VITALS — BP 124/70 | HR 66 | Temp 97.2°F | Resp 12 | Ht 65.0 in | Wt 183.0 lb

## 2013-09-10 DIAGNOSIS — Z1211 Encounter for screening for malignant neoplasm of colon: Secondary | ICD-10-CM

## 2013-09-10 MED ORDER — SODIUM CHLORIDE 0.9 % IV SOLN: 500.0000 mL | INTRAVENOUS | Status: DC

## 2013-09-10 NOTE — Op Note (Signed)
[facility] [address] [place], [address]   COLONOSCOPY PROCEDURE REPORT  PATIENT: Heidi [name], Heidi [name]  MR#: [id number] BIRTHDATE: Dec 27, 1945 , 67  yrs. old GENDER: Female ENDOSCOPIST: Meryl Dare, MD, [facility] PROCEDURE DATE:  09/10/2013 PROCEDURE:   Colonoscopy, screening First Screening Colonoscopy - Avg.  risk and is 50 yrs.  old or older - No.  Prior Negative Screening - Now for repeat screening. N/A  History of Adenoma - Now for follow-up colonoscopy & has been > or = to 3 yrs.  N/A  Polyps Removed Today? No.  Recommend repeat exam, <10 yrs? No. ASA CLASS:   Class II INDICATIONS:average risk screening. MEDICATIONS: MAC sedation, administered by CRNA and propofol (Diprivan) 350mg  IV DESCRIPTION OF PROCEDURE:   After the risks benefits and alternatives of the procedure were thoroughly explained, informed consent was obtained.  A digital rectal exam revealed no abnormalities of the rectum.   The LB [id number]  endoscope was introduced through the anus and advanced to the cecum, which was identified by both the appendix and ileocecal valve. No adverse events experienced  with a tortuous and redundant colon.   The quality of the prep was good, using MoviPrep  The instrument was then slowly withdrawn as the colon was fully examined.  COLON FINDINGS: A normal appearing cecum, ileocecal valve, and appendiceal orifice were identified.  The ascending, hepatic flexure, transverse, splenic flexure, descending, sigmoid colon and rectum appeared unremarkable.  No polyps or cancers were seen. Retroflexed views revealed no abnormalities. The time to cecum=4 minutes 01 seconds.  Withdrawal time=12 minutes 47 seconds.  The scope was withdrawn and the procedure completed.  COMPLICATIONS: There were no complications.  ENDOSCOPIC IMPRESSION: 1.  Normal colon  RECOMMENDATIONS: 1.  You should continue to follow colorectal cancer screening guidelines for "routine  risk" patients with a repeat colonoscopy in 10 years.  There is no need for routine screening FOBT (stool) testing for at least 5 years.  eSigned:  Meryl Dare, MD, [facility] 09/10/2013 9:56 AM

## 2013-09-10 NOTE — Patient Instructions (Signed)
YOU HAD AN ENDOSCOPIC PROCEDURE TODAY AT THE [facility]: Refer to the procedure report that was given to you for any specific questions about what was found during the examination.  If the procedure report does not answer your questions, please call your gastroenterologist to clarify.  If you requested that your care partner not be given the details of your procedure findings, then the procedure report has been included in a sealed envelope for you to review at your convenience later.  YOU SHOULD EXPECT: Some feelings of bloating in the abdomen. Passage of more gas than usual.  Walking can help get rid of the air that was put into your GI tract during the procedure and reduce the bloating. If you had a lower endoscopy (such as a colonoscopy or flexible sigmoidoscopy) you may notice spotting of blood in your stool or on the toilet paper. If you underwent a bowel prep for your procedure, then you may not have a normal bowel movement for a few days.  DIET: Your first meal following the procedure should be a light meal and then it is ok to progress to your normal diet.  A half-sandwich or bowl of soup is an example of a good first meal.  Heavy or fried foods are harder to digest and may make you feel nauseous or bloated.  Likewise meals heavy in dairy and vegetables can cause extra gas to form and this can also increase the bloating.  Drink plenty of fluids but you should avoid alcoholic beverages for 24 hours.  ACTIVITY: Your care partner should take you home directly after the procedure.  You should plan to take it easy, moving slowly for the rest of the day.  You can resume normal activity the day after the procedure however you should NOT DRIVE or use heavy machinery for 24 hours (because of the sedation medicines used during the test).    SYMPTOMS TO REPORT IMMEDIATELY: A gastroenterologist can be reached at any hour.  During normal business hours, 8:30 AM to 5:00 PM Monday through Friday,  call [phone].  After hours and on weekends, please call the GI answering service at [phone] who will take a message and have the physician on call contact you.   Following lower endoscopy (colonoscopy or flexible sigmoidoscopy):  Excessive amounts of blood in the stool  Significant tenderness or worsening of abdominal pains  Swelling of the abdomen that is new, acute  Fever of 100F or higher  FOLLOW UP: If any biopsies were taken you will be contacted by phone or by letter within the next 1-3 weeks.  Call your gastroenterologist if you have not heard about the biopsies in 3 weeks.  Our staff will call the home number listed on your records the next business day following your procedure to check on you and address any questions or concerns that you may have at that time regarding the information given to you following your procedure. This is a courtesy call and so if there is no answer at the home number and we have not heard from you through the emergency physician on call, we will assume that you have returned to your regular daily activities without incident.  SIGNATURES/CONFIDENTIALITY: You and/or your care partner have signed paperwork which will be entered into your electronic medical record.  These signatures attest to the fact that that the information above on your After Visit Summary has been reviewed and is understood.  Full responsibility of the confidentiality of this   discharge information lies with you and/or your care-partner.  Repeat colonoscopy in 10 years-2024 

## 2013-09-10 NOTE — Progress Notes (Signed)
Patient did not experience any of the following events: a burn prior to discharge; a fall within the facility; wrong site/side/patient/procedure/implant event; or a hospital transfer or hospital admission upon discharge from the facility. (G8907) Patient did not have preoperative order for IV antibiotic SSI prophylaxis. (G8918)  

## 2013-09-11 ENCOUNTER — Telehealth: Payer: Self-pay | Admitting: *Deleted

## 2013-09-11 NOTE — Telephone Encounter (Signed)
  Follow up Call-  Call back number 09/10/2013  Post procedure Call Back phone  # [phone]  Permission to leave phone message Yes     Patient questions:  Do you have a fever, pain , or abdominal swelling? no Pain Score  0 *  Have you tolerated food without any problems? yes  Have you been able to return to your normal activities? yes  Do you have any questions about your discharge instructions: Diet   no Medications  no Follow up visit  no  Do you have questions or concerns about your Care? no  Actions: * If pain score is 4 or above: No action needed, pain <4.

## 2013-10-13 ENCOUNTER — Other Ambulatory Visit (INDEPENDENT_AMBULATORY_CARE_PROVIDER_SITE_OTHER): Payer: Medicare Other

## 2013-10-13 ENCOUNTER — Ambulatory Visit (INDEPENDENT_AMBULATORY_CARE_PROVIDER_SITE_OTHER): Payer: Medicare Other | Admitting: Pulmonary Disease

## 2013-10-13 ENCOUNTER — Encounter: Payer: Self-pay | Admitting: Pulmonary Disease

## 2013-10-13 VITALS — BP 118/68 | HR 60 | Temp 97.8°F | Ht 65.0 in | Wt 187.2 lb

## 2013-10-13 DIAGNOSIS — E7800 Pure hypercholesterolemia, unspecified: Secondary | ICD-10-CM

## 2013-10-13 DIAGNOSIS — M810 Age-related osteoporosis without current pathological fracture: Secondary | ICD-10-CM

## 2013-10-13 DIAGNOSIS — I872 Venous insufficiency (chronic) (peripheral): Secondary | ICD-10-CM

## 2013-10-13 DIAGNOSIS — M1990 Unspecified osteoarthritis, unspecified site: Secondary | ICD-10-CM

## 2013-10-13 DIAGNOSIS — K589 Irritable bowel syndrome without diarrhea: Secondary | ICD-10-CM

## 2013-10-13 DIAGNOSIS — F411 Generalized anxiety disorder: Secondary | ICD-10-CM

## 2013-10-13 DIAGNOSIS — C649 Malignant neoplasm of unspecified kidney, except renal pelvis: Secondary | ICD-10-CM

## 2013-10-13 DIAGNOSIS — I10 Essential (primary) hypertension: Secondary | ICD-10-CM

## 2013-10-13 DIAGNOSIS — K219 Gastro-esophageal reflux disease without esophagitis: Secondary | ICD-10-CM

## 2013-10-13 LAB — BASIC METABOLIC PANEL
Calcium: 9.6 mg/dL (ref 8.4–10.5)
GFR: 70.42 mL/min (ref >60.00)
Potassium: 3.9 mEq/L (ref 3.5–5.1)
Sodium: 141 mEq/L (ref 135–145)

## 2013-10-13 LAB — CBC WITH DIFFERENTIAL/PLATELET
Basophils Absolute: 0 10*3/uL (ref 0.0–0.1)
Basophils Relative: 0.4 % (ref 0.0–3.0)
HCT: 38.1 % (ref 36.0–46.0)
Hemoglobin: 12.5 g/dL (ref 12.0–15.0)
Lymphocytes Relative: 39.4 % (ref 12.0–46.0)
Lymphs Abs: 2.3 10*3/uL (ref 0.7–4.0)
Monocytes Relative: 8.1 % (ref 3.0–12.0)
Neutro Abs: 2.9 10*3/uL (ref 1.4–7.7)
RBC: 4.28 Mil/uL (ref 3.87–5.11)
RDW: 14.8 % — ABNORMAL HIGH (ref 11.5–14.6)

## 2013-10-13 LAB — HEPATIC FUNCTION PANEL
AST: 19 U/L (ref 0–37)
Albumin: 4.2 g/dL (ref 3.5–5.2)
Alkaline Phosphatase: 63 U/L (ref 39–117)
Bilirubin, Direct: 0.1 mg/dL (ref 0.0–0.3)
Total Bilirubin: 0.7 mg/dL (ref 0.3–1.2)

## 2013-10-13 LAB — LIPID PANEL
HDL: 50.8 mg/dL (ref >39.00)
LDL Cholesterol: 126 mg/dL — ABNORMAL HIGH (ref 0–99)
Total CHOL/HDL Ratio: 4
VLDL: 12 mg/dL (ref 0.0–40.0)

## 2013-10-13 MED ORDER — LOSARTAN POTASSIUM-HCTZ 100-12.5 MG PO TABS: ORAL_TABLET | ORAL | Status: DC

## 2013-10-13 MED ORDER — PRAVASTATIN SODIUM 40 MG PO TABS: ORAL_TABLET | ORAL | Status: DC

## 2013-10-13 NOTE — Patient Instructions (Signed)
Today we updated your med list in our EPIC system...    Continue your current medications the same...    We refilled your meds per request...  Today we did your follow up FASTING blood work...    We will contact you w/ the results when available...   Keep up the good work w/ your diet, juicing, & exercise program w/ yoga...  Call for any questions...  Let's plan a follow up visit in 66mo, sooner if needed for problems.Marland KitchenMarland Kitchen

## 2013-10-13 NOTE — Progress Notes (Signed)
Subjective:    Patient ID: Heidi [name], female    DOB: [birth year], 67 y.o.   MRN: [id number]  HPI 67 y/o BF here for a follow up visit... she has mult med problems as noted below...  SEE PREV EPIC NOTES FOR EARLIER DATA >>  ~  April 11, 2012:  23mo ROV & [name] has had a good interval, no new complaints or concerns; she turns 66 next week 7 we will give her the Pneumonia vaccine today...    HBP> on Losartan/Hct 100/12.5 ?taking 1/2 tab daily, but all med lists read 1 tab/d; BP= 118/68 & she denies CP, palpit, SOB, dizzy, edema, etc...    CHOL> on Prav40 now & tol well; FLP showed TChol 189, TG 56, HDL 53, LDL 125    GI> GERD, IBS> she takes Flax seed in Yogurt & states bowels much better; she also take a number of supplements per [name]...    Hx Renal cell ca> s/p right nephrectomy 2001; no known recurrence; renal function remains normal w/ BUM=14, Creat=1.1    DJD> using OTC meds and Tylenol; rec to continue exercise program & home therapy...    We reviewed prob list, meds, xrays and labs> see below>> LABS 6/13:  FLP- LDL=125 on Prav40;  BMet- wnl... Note: reminded to take Prav40 every night & get on better low chol diet, get wt down...  ~  October 11, 2012:  23mo ROV & her CC= sinus HA, frontal area, congested, draining, w/o f/s/c; states improved w/ OTC (coffee & Advil) & she declines my offer to rx w/ antibiotic, cortisone, pain med, etc... Also her sis says sleep apnea- offered sleep study but she declines (wakes refreshed & denies daytime sleepiness)...    HBP> on ASA 81, Losartan/Hct 100/12.5-1/2;  BP= 118/74 & she denies CP, palpit, SOB, dizzy, edema, etc...    Ven Insuffic> reminded to elim salt, elev legs, wear support hose...    CHOL> on Prav40-1/2 now & tol well; FLP showed TChol 169, TG 71, HDL 49, LDL 106    GI> GERD, IBS> she takes Flax seed in Yogurt & Align; states bowels much better- denies n/v, c/d, blood seen; she also take a number of supplements per [name]...    Hx Renal cell  ca> s/p right nephrectomy 2001; no known recurrence; renal function remains normal w/ BUN=15, Creat=1.1    DJD> using OTC meds and Tylenol; rec to continue exercise program & home therapy...    Vit D defic> on VitD50K every other week per Gyn;  We reviewed prob list, meds, xrays and labs> see below for updates >> we gave her the 2013 flu vaccine today... CXR 12/13 showed normal heart size, clear lungs, NAD.Marland Kitchen EKG 12/13 showed SBrady, rate54, WNL, NAD... LABS 12/13:  FLP- ok on Prav40 w/ LDL=106;  Chems- wnl;  CBC- wnl;  TSH=1.20;  UA=ok...  ~  April 09, 2013:  23mo ROV & [name] is doing well & she had a good interval w/o new complaints or concerns; recently treated by Podiatry for left achilles tendonitis w/ 2 shots and pred, rec for PT but she's doing her own exercises; note that she's gained 6# & asked to consider bike or other non-weight-bearing exercise + diet etc... We reviewed the following medical problems during today's office visit >>     HBP> on ASA 81, Losartan/Hct 100/12.5-1/2 tab;  BP= 124/82 & she denies CP, palpit, SOB, dizzy, edema, etc...    Ven Insuffic> reminded to elim salt, elev legs, wear  support hose, etc...    CHOL> on Prav40-1/2tab & tol well; FLP 12/13 showed TChol 169, TG 71, HDL 49, LDL 106    Overweight> wt is up 6# to 191#, BMI= 31-2, but she is excited about new juicer 7 she is doing this for 75meal/d...    GI> GERD, IBS> she takes Flax seed in Yogurt & Align; states bowels much better- denies n/v, c/d, blood seen; she also take a number of supplements per [name]...    Hx Renal cell ca> s/p right nephrectomy 2001; no known recurrence; renal function remains normal w/ labs 12/13 showing BUN=15, Creat=1.1    DJD> using OTC meds and Tylenol; rec to continue exercise program & home therapy; developed achilles tendonitis 5/14 w/ shots & Pred per Podiatry.    Vit D defic> on VitD50K every other week per Gyn... We reviewed prob list, meds, xrays and labs> see below for updates >>    ~  October 13, 2013:  43mo ROV & Kaneisha continues to do well- no new complaints or concerns... She takes nutritional shakes daily...    BP controlled on Hyzaar 100-12.5 daily & BP= 118/68 today w/o HAs, visualsymptoms, CP, palpit, SOB, edema, etc...    Chol is treated w/ diet & Prav40-1/2 daily; FLP not quite at goals w/ LDL=126 but she does not want to incr the dose; we reviewed diet & exercise...    Her weight is 187# w/ BMI~31; we reviewed wt reducing diet strategies...    Hx renal cell ca w/ right nephrectomy 2001, no known recurrence...     She takes a number of supplements... We reviewed prob list, meds, xrays and labs> see below for updates >> she had the 2014 Flu vaccine in Nov... meds refilled per request... LABS 12/14:  FLP- ok x LDL=126 on Prav20;  Chems- wnl;  CBC- wnl;  TSH=1.91;  VitD=56...            Problem List:   HYPERTENSION (ICD-401.9) - controlled on ASA 81mg /d, & LOSARTAN/ HCT 100-12.5 taking 1/2 tab daily...  ~  6/12: BP=128/78 and tol Rx well... she denies HA, fatigue, visual changes, CP, palipit, dizziness, syncope, dyspnea, edema, etc... ~  12/12: BP= 124/72 & doing well w/o symptoms... ~  6/13:  BP= 118/68 & she denies CP, palpit, SOB, dizzy, edema, etc... ~  12/13: on ASA 81, Losartan/Hct 100/12.5-1/2;  BP= 118/74 & she denies CP, palpit, SOB, dizzy, edema, etc...  ~  CXR 12/13 showed norm heart size, clear lungs, wnl/ NAD.Marland KitchenMarland Kitchen EKG 12/13 showed SBrady, rate54, wnl/ NAD... ~  6/14: on ASA 81, Losartan/Hct 100/12.5-1/2 tab;  BP= 124/82 & she denies CP, palpit, SOB, dizzy, edema, etc ~  12/14: BP controlled on Hyzaar 100-12.5 daily & BP= 118/68 today w/o HAs, visualsymptoms, CP, palpit, SOB, edema, etc.  VENOUS INSUFFICIENCY (ICD-459.81) - low salt diet, support hose when nec... she reports hammer toe per podiatry but she doesn't want surg...  HYPERCHOLESTEROLEMIA (ICD-272.0) - We discussed diet +exercise to get weight down... ~  FLP 3/09 showed TChol 198, TG 80,  HDL 59, LDL 123... not at goal- start Simvast20/d. ~  FLP 9/09 showed TChol 139, TG 74, HDL 47, LDL 77... doing well on Simva20, but "intol" per pt, refer to [facility]. ~  FLP 3/10 on Crestor2.5 showed TChol 151, TG 78, HDL 54, LDL 82... continue same & [facility]. ~  FLP 9/10 showed TChol 159, TG 64, HDL 54, LDL 93 ~  FLP 12/10 showed TChol 164, TG 74,  HDL 51, LDL 98 ~  FLP 3/11 showed TChol 163, TG 73, HDL 56, LDL 92 ~  FLP 12/11 on Cres2.5 showed TChol 172, TG 69, HDL 50, LDL 108... We reviewed diet & exercise... ~  FLP 6/12 on Cres2.5 showed TChol 165, TG 75, HDL 48, LDL 102... she requests change meds due to cost> try PRAV40 (she decided on 1/2) ~  FLP 12/12 on Cres2.5 showed TChol 173, TG 85, HDL 50, LDL 106... Hasn't switched to the PRAV40 yet! ~  FLP 6/13 on Prav40-1/2 showed TChol 189, TG 56, HDL 53, LDL 125... rec to take Qhs, get on diet, get wt down... ~  FLP 12/13 on Prav40-1/2 showed TChol 169, TG 71, HDL 49, LDL 106  ~  FLP 12/14 on Prav40-1/2 showed TChol 189, TG 60, HDL 51, LDL 126... She does not want to incr the Prav, discussed diet & wt reduction.  GERD (ICD-530.81) IRRITABLE BOWEL SYNDROME (ICD-564.1) - followed by [name] and notes reviewed... he rec increased fiber, Benefiber, Robinul as needed... she prefers therapy from an herbalist and taking "daily cleanse pill" which helped; now she takes 2tablesp flax seeds in yogurt daily & improved... ~  prev colonoscopy 9/01 by [name] was WNL.Marland Kitchen.  ~  f/u colonoscopy 11/09 by [name] was neg- WNL, f/u planned 47yrs... ~  F/u colonoscopy 11/14 by [name] was neg- norm colon & f/u exam suggested in 68yrs...   Hx of CARCINOMA, RENAL CELL (ICD-189.0) - s/p right nephrectomy 2001 at [facility] by [facility]... he signed off in 2006 and exam/ CXR's have been neg-  ~  f/u CXR 9/09 was clear, WNL... ~  12/10:  we discussed CXR (neg- WNL) & full labs (OK)& she will return for Fasting blood work... ~  12/11:  CXR remains neg- clear, WNL...  Renal function normal w/ BUN=14, Creat=1.0 ~  12/12:  CXR remains clear, no lesions & labs similarly normal... Renal function normal w/ BUN=16, Creat=1.1  DEGENERATIVE JOINT DISEASE (ICD-715.90) - she's improved w/ exercise program... but c/o bilat R>L knee discomfort w/ eval by Ortho (?who) w/ XRays and shot... doesn't want Rx- using OTC anti-inflamm + Tylenol... She notes that elliptical exercise helps her knees... ~  [facility]- all wnl...  Hx of HEADACHE (ICD-784.0)  ANXIETY (ICD-300.00) - she does not wish to have her Alpraz refilled...  Health Maintenance - GYN= [name] and she does PAPs and checked Vit D level on pt (pt reports taking 40981 u Vit D every other day now w/ her Vit D level = 55 during Dec11 OV)... she notes sister dx w/ breast [name] 2010 & she wants to wean off her hormone Rx, & has discussed this w/ Gyn... she gets regular Mammograms (last 1/14= neg) & BMD[facility]> BMD 12/11 was WNL & she remains on Calcium, MVI, Vit D... ~  Immunizations:  Given TDAP here 2/12... Encouraged to get the yearly Flu vaccine... Given PNEUMOVAX 6/13 (66th birthday)...   Past Surgical History  Procedure Laterality Date  . Nephrectomy  2001    right = for renal cell CA  . Total abdominal hysterectomy      Outpatient Encounter Prescriptions as of 10/13/2013  Medication Sig  . ALPHA LIPOIC ACID PO Take by mouth daily.    Marland Kitchen aspirin 81 MG tablet Take 81 mg by mouth daily.    . Biotin 10 MG TABS Take by mouth daily.    . Coenzyme Q10 (COQ10) 100 MG CAPS Take  by mouth daily.    . ergocalciferol (VITAMIN D2) 50000 UNITS capsule Every other week   . Evening Primrose Oil 1000 MG CAPS Take by mouth daily.    Marland Kitchen losartan-hydrochlorothiazide (HYZAAR) 100-12.5 MG per tablet Take 1/2 tablet by mouth daily  . Multiple Vitamin (MULTIVITAMIN) capsule Take 1 capsule by mouth daily.    . pravastatin (PRAVACHOL) 40 MG tablet Take 1/2 tablet by mouth daily     No Known Allergies   Current Medications, Allergies, Past Medical History, Past Surgical History, Family History, and Social History were reviewed in [employer] record.    Review of Systems        See HPI - all other systems neg except as noted... The patient denies anorexia, fever, weight loss, weight gain, vision loss, decreased hearing, hoarseness, chest pain, syncope, dyspnea on exertion, peripheral edema, prolonged cough, headaches, hemoptysis, abdominal pain, melena, hematochezia, severe indigestion/heartburn, hematuria, incontinence, muscle weakness, suspicious skin lesions, transient blindness, difficulty walking, depression, unusual weight change, abnormal bleeding, enlarged lymph nodes, and angioedema.     Objective:   Physical Exam     WD, Overweight, 66 y/o BF in NAD... GENERAL:  Alert & oriented; pleasant & cooperative... HEENT:  [place]/AT, EOM-wnl, PERRLA, EACs-clear, TMs-wnl, NOSE-clear, THROAT-clear & wnl. NECK:  Supple w/ fairROM; no JVD; normal carotid impulses w/o bruits; no thyromegaly or nodules palpated; no lymphadenopathy. CHEST:  Clear to P & A; without wheezes/ rales/ or rhonchi. HEART:  Regular Rhythm; without murmurs/ rubs/ or gallops. ABDOMEN:  Soft & nontender; normal bowel sounds; no organomegaly or masses detected. EXT: without deformities, mod arthritic changes w/ bilat knee crepitus on exam;  no varicose veins/ +venous insuffic/ no edema. NEURO:  CN's intact; motor testing normal; sensory testing normal; gait normal & balance OK. DERM:  No lesions noted; no rash etc... flower tatoo over right breast area...  RADIOLOGY DATA:  Reviewed in the EPIC EMR & discussed w/ the patient...  LABORATORY DATA:  Reviewed in the EPIC EMR & discussed w/ the patient...   Assessment & Plan:    HBP>  Controlled on Hyzaar, continue same + diet, exercise, get wt down...  CHOL>  FLP fair on PRAVASTATIN 40mg -1/2 w/ LDL = 126; she is not inclined  to incr med to 1 tab daily, therefore continue same + better diet...  GI>  GERD/ IBS>  States her bowels are better on flax seeds in yogurt per [name]...  GU>  s/p right nephrectomy for renal cell ca in 2001; renal funct remains WNL w/ Creat ~1.1 & CXR remains clear...  DJD>  She does elliptical exercises & states this helps her knees...  Other medical issues as noted...   Patient's Medications  New Prescriptions   No medications on file  Previous Medications   ALPHA LIPOIC ACID PO    Take by mouth daily.     ASPIRIN 81 MG TABLET    Take 81 mg by mouth daily.     BIOTIN 10 MG TABS    Take by mouth daily.     COENZYME Q10 (COQ10) 100 MG CAPS    Take by mouth daily.     ERGOCALCIFEROL (VITAMIN D2) 50000 UNITS CAPSULE    Every other week    EVENING PRIMROSE OIL 1000 MG CAPS    Take by mouth daily.     MULTIPLE VITAMIN (MULTIVITAMIN) CAPSULE    Take 1 capsule by mouth daily.    Modified Medications   Modified Medication Previous Medication  LOSARTAN-HYDROCHLOROTHIAZIDE (HYZAAR) 100-12.5 MG PER TABLET losartan-hydrochlorothiazide (HYZAAR) 100-12.5 MG per tablet      Take 1/2 tablet by mouth daily    Take 1/2 tablet by mouth daily   PRAVASTATIN (PRAVACHOL) 40 MG TABLET pravastatin (PRAVACHOL) 40 MG tablet      Take 1/2 tablet by mouth daily    Take 1/2 tablet by mouth daily  Discontinued Medications   No medications on file

## 2013-10-14 LAB — VITAMIN D 25 HYDROXY (VIT D DEFICIENCY, FRACTURES): Vit D, 25-Hydroxy: 56 ng/mL (ref 30–89)

## 2013-10-15 ENCOUNTER — Telehealth: Payer: Self-pay | Admitting: Pulmonary Disease

## 2013-10-15 NOTE — Progress Notes (Signed)
Quick Note:  Spoke with pt and notified of results per Dr. Nadel. Pt verbalized understanding and denied any questions.  ______ 

## 2013-10-15 NOTE — Telephone Encounter (Signed)
Notes Recorded by Michele Mcalpine, MD on 10/14/2013 at 1:52 PM Please notify patient>  FLP looks good on Prav40-1/2 tab daily but LDL=126 not at goal; Rec same med, better diet, get wt down... Chems, LFTs, CBC, Thyroid, VitD are all WNL.Marland KitchenMarland Kitchen  Spoke with pt and notified of results per Dr. Kriste Basque. Pt verbalized understanding and denied any questions.

## 2013-11-17 ENCOUNTER — Other Ambulatory Visit: Payer: Self-pay | Admitting: Pulmonary Disease

## 2013-11-17 DIAGNOSIS — Z1231 Encounter for screening mammogram for malignant neoplasm of breast: Secondary | ICD-10-CM

## 2013-11-20 ENCOUNTER — Ambulatory Visit (HOSPITAL_COMMUNITY)
Admission: RE | Admit: 2013-11-20 | Discharge: 2013-11-20 | Disposition: A | Discharge status: Home / Self Care | Payer: Medicare HMO | Source: Ambulatory Visit | Attending: Pulmonary Disease | Admitting: Pulmonary Disease

## 2013-11-20 DIAGNOSIS — Z1231 Encounter for screening mammogram for malignant neoplasm of breast: Secondary | ICD-10-CM | POA: Insufficient documentation

## 2013-12-01 ENCOUNTER — Encounter: Payer: Self-pay | Admitting: Nurse Practitioner

## 2013-12-01 ENCOUNTER — Ambulatory Visit (INDEPENDENT_AMBULATORY_CARE_PROVIDER_SITE_OTHER): Payer: Medicare HMO | Admitting: Nurse Practitioner

## 2013-12-01 VITALS — BP 130/76 | HR 64 | Ht 65.0 in | Wt 187.0 lb

## 2013-12-01 DIAGNOSIS — Z01419 Encounter for gynecological examination (general) (routine) without abnormal findings: Secondary | ICD-10-CM

## 2013-12-01 DIAGNOSIS — I10 Essential (primary) hypertension: Secondary | ICD-10-CM

## 2013-12-01 DIAGNOSIS — R82998 Other abnormal findings in urine: Secondary | ICD-10-CM

## 2013-12-01 DIAGNOSIS — Z0000 Encounter for general adult medical examination without abnormal findings: Secondary | ICD-10-CM

## 2013-12-01 DIAGNOSIS — E559 Vitamin D deficiency, unspecified: Secondary | ICD-10-CM

## 2013-12-01 DIAGNOSIS — Z124 Encounter for screening for malignant neoplasm of cervix: Secondary | ICD-10-CM

## 2013-12-01 DIAGNOSIS — Z85528 Personal history of other malignant neoplasm of kidney: Secondary | ICD-10-CM

## 2013-12-01 DIAGNOSIS — R8290 Unspecified abnormal findings in urine: Secondary | ICD-10-CM

## 2013-12-01 LAB — POCT URINALYSIS DIPSTICK
BILIRUBIN UA: NEGATIVE
Blood, UA: NEGATIVE
GLUCOSE UA: NEGATIVE
KETONES UA: NEGATIVE
Nitrite, UA: NEGATIVE
PROTEIN UA: NEGATIVE
Urobilinogen, UA: NEGATIVE
pH, UA: 5.5

## 2013-12-01 MED ORDER — ERGOCALCIFEROL 1.25 MG (50000 UT) PO CAPS: 50000.0000 [IU] | ORAL_CAPSULE | ORAL | Status: DC

## 2013-12-01 NOTE — Progress Notes (Signed)
Patient ID: Heidi [name], female   DOB: June 04, 1946, 68 y.o.   MRN: [id number] 68 y.o. G3P2 Divorced African American Fe here for annual exam. Same partner. She is still working some. Denies urinary symptoms.   New granddaughter is now 72 months old.  No LMP recorded. Patient has had a hysterectomy.          Sexually active: yes  The current method of family planning is status post hysterectomy.    Exercising: yes  Gym/ health club routine includes elliptical. Smoker:  no  Health Maintenance: Pap:  03/15/00, WNL (TAH) MMG:  11/20/13, Bi-Rads 1:  negative Colonoscopy:  09/10/13, normal, recall in 10 years BMD:  10/22/10 T Score: spine: 1.4; right neck -0.3; total 0.2 TDaP:  2012 Labs:  HB: PCP  Urine:  2+ leuk's   reports that she quit smoking about 30 years ago. Her smoking use included Cigarettes. She has a 2.5 pack-year smoking history. She has never used smokeless tobacco. She reports that she drinks alcohol. She reports that she does not use illicit drugs.  Past Medical History  Diagnosis Date  . Chronic rhinitis   . HTN (hypertension)   . Venous insufficiency   . Hypercholesterolemia   . GERD (gastroesophageal reflux disease)   . IBS (irritable bowel syndrome)   . DJD (degenerative joint disease)   . History of headache   . Anxiety   . Renal cell carcinoma 2001    Past Surgical History  Procedure Laterality Date  . Nephrectomy  2001    right = for renal cell CA  . Total abdominal hysterectomy      Current Outpatient Prescriptions  Medication Sig Dispense Refill  . ALPHA LIPOIC ACID PO Take by mouth daily.        Marland Kitchen aspirin 81 MG tablet Take 81 mg by mouth daily.        . Biotin 10 MG TABS Take by mouth daily.        . Coenzyme Q10 (COQ10) 100 MG CAPS Take by mouth daily.        . ergocalciferol (VITAMIN D2) 50000 UNITS capsule Take 1 capsule (50,000 Units total) by mouth every 14 (fourteen) days. Every other week  30 capsule  3  . Evening Primrose Oil 1000 MG CAPS Take  by mouth daily.        Marland Kitchen losartan-hydrochlorothiazide (HYZAAR) 100-12.5 MG per tablet Take 1/2 tablet by mouth daily  45 tablet  3  . Multiple Vitamin (MULTIVITAMIN) capsule Take 1 capsule by mouth daily.        . pravastatin (PRAVACHOL) 40 MG tablet Take 1/2 tablet by mouth daily  45 tablet  3   No current facility-administered medications for this visit.    Family History  Problem Relation Age of Onset  . Healthy Mother   . Prostate cancer Father   . Colon cancer Neg Hx   . Pancreatic cancer Neg Hx   . Stomach cancer Neg Hx   . Breast cancer Sister   . Cancer - Other Brother     neck ? lymph nodes    ROS:  Pertinent items are noted in HPI.  Otherwise, a comprehensive ROS was negative.  Exam:   BP 130/76  Pulse 64  Ht 5\' 5"  (1.651 m)  Wt 187 lb (84.823 kg)  BMI 31.12 kg/m2 Height: 5\' 5"  (165.1 cm)  Ht Readings from Last 3 Encounters:  12/01/13 5\' 5"  (1.651 m)  10/13/13 5\' 5"  (1.651 m)  09/10/13 5'  5" (1.651 m)    General appearance: alert, cooperative and appears stated age Head: Normocephalic, without obvious abnormality, atraumatic Neck: no adenopathy, supple, symmetrical, trachea midline and thyroid normal to inspection and palpation Lungs: clear to auscultation bilaterally Breasts: normal appearance, no masses or tenderness Heart: regular rate and rhythm Abdomen: soft, non-tender; no masses,  no organomegaly Extremities: extremities normal, atraumatic, no cyanosis or edema Skin: Skin color, texture, turgor normal. No rashes or lesions Lymph nodes: Cervical, supraclavicular, and axillary nodes normal. No abnormal inguinal nodes palpated Neurologic: Grossly normal   Pelvic: External genitalia:  no lesions              Urethra:  normal appearing urethra with no masses, tenderness or lesions              Bartholin's and Skene's: normal                 Vagina: normal appearing vagina with normal color and discharge, no lesions              Cervix: absent               Pap taken: no Bimanual Exam:  Uterus:  uterus absent              Adnexa: no mass, fullness, tenderness               Rectovaginal: Confirms               Anus:  normal sphincter tone, no lesions  A:  Well Woman with normal exam  S/P TAH 1981 secondary to fibroids - ERT stopped 09/2009  History of Vit D deficiency  S/P Right Nephrectomy secondary to cancer 11/01  History of TN, GERD, hypercholesterolemia  R/O UTI - asymptomatic  P:   Pap smear as per guidelines   Mammogram due 10/2014  Follow with urine results  Counseled on breast self exam, mammography screening, adequate intake of calcium and vitamin D, diet and exercise return annually or prn  An After Visit Summary was printed and given to the patient.

## 2013-12-01 NOTE — Patient Instructions (Signed)

## 2013-12-02 LAB — URINE CULTURE
COLONY COUNT: NO GROWTH
ORGANISM ID, BACTERIA: NO GROWTH

## 2013-12-02 LAB — URINALYSIS, MICROSCOPIC ONLY
BACTERIA UA: NONE SEEN
CASTS: NONE SEEN
Crystals: NONE SEEN

## 2013-12-02 LAB — VITAMIN D 25 HYDROXY (VIT D DEFICIENCY, FRACTURES): Vit D, 25-Hydroxy: 49 ng/mL (ref 30–89)

## 2013-12-04 NOTE — Progress Notes (Signed)
Encounter reviewed by Dr. Tariq Pernell Silva.  

## 2014-04-13 ENCOUNTER — Encounter: Payer: Self-pay | Admitting: Pulmonary Disease

## 2014-04-13 ENCOUNTER — Ambulatory Visit (INDEPENDENT_AMBULATORY_CARE_PROVIDER_SITE_OTHER): Payer: Medicare HMO | Admitting: Pulmonary Disease

## 2014-04-13 ENCOUNTER — Encounter (INDEPENDENT_AMBULATORY_CARE_PROVIDER_SITE_OTHER): Payer: Self-pay

## 2014-04-13 VITALS — BP 128/74 | HR 54 | Temp 97.9°F | Ht 65.0 in | Wt 177.6 lb

## 2014-04-13 DIAGNOSIS — I872 Venous insufficiency (chronic) (peripheral): Secondary | ICD-10-CM

## 2014-04-13 DIAGNOSIS — M1990 Unspecified osteoarthritis, unspecified site: Secondary | ICD-10-CM

## 2014-04-13 DIAGNOSIS — I10 Essential (primary) hypertension: Secondary | ICD-10-CM

## 2014-04-13 DIAGNOSIS — F411 Generalized anxiety disorder: Secondary | ICD-10-CM

## 2014-04-13 DIAGNOSIS — K219 Gastro-esophageal reflux disease without esophagitis: Secondary | ICD-10-CM

## 2014-04-13 DIAGNOSIS — C649 Malignant neoplasm of unspecified kidney, except renal pelvis: Secondary | ICD-10-CM

## 2014-04-13 DIAGNOSIS — K589 Irritable bowel syndrome without diarrhea: Secondary | ICD-10-CM

## 2014-04-13 DIAGNOSIS — E7800 Pure hypercholesterolemia, unspecified: Secondary | ICD-10-CM

## 2014-04-13 MED ORDER — LOSARTAN POTASSIUM-HCTZ 100-12.5 MG PO TABS: ORAL_TABLET | ORAL | Status: DC

## 2014-04-13 MED ORDER — PRAVASTATIN SODIUM 40 MG PO TABS: ORAL_TABLET | ORAL | Status: DC

## 2014-04-13 NOTE — Progress Notes (Signed)
Subjective:    Patient ID: Heidi [name], female    DOB: 11-Nov-1945, 68 y.o.   MRN: [id number]  HPI 68 y/o BF here for a follow up visit... she has mult med problems as noted below...  SEE PREV EPIC NOTES FOR EARLIER DATA >>  ~  October 11, 2012:  36mo ROV & her CC= sinus HA, frontal area, congested, draining, w/o f/s/c; states improved w/ OTC (coffee & Advil) & she declines my offer to rx w/ antibiotic, cortisone, pain med, etc... Also her sis says sleep apnea- offered sleep study but she declines (wakes refreshed & denies daytime sleepiness)...    HBP> on ASA 81, Losartan/Hct 100/12.5-1/2;  BP= 118/74 & she denies CP, palpit, SOB, dizzy, edema, etc...    Ven Insuffic> reminded to elim salt, elev legs, wear support hose...    CHOL> on Prav40-1/2 now & tol well; FLP showed TChol 169, TG 71, HDL 49, LDL 106    GI> GERD, IBS> she takes Flax seed in [place] & Align; states bowels much better- denies n/v, c/d, blood seen; she also take a number of supplements per [name]...    Hx Renal cell ca> s/p right nephrectomy 2001; no known recurrence; renal function remains normal w/ BUN=15, Creat=1.1    DJD> using OTC meds and Tylenol; rec to continue exercise program & home therapy...    Vit D defic> on VitD50K every other week per Gyn;  We reviewed prob list, meds, xrays and labs> see below for updates >> we gave her the 2013 flu vaccine today... CXR 12/13 showed normal heart size, clear lungs, NAD.Marland Kitchen EKG 12/13 showed SBrady, rate54, WNL, NAD... LABS 12/13:  FLP- ok on Prav40 w/ LDL=106;  Chems- wnl;  CBC- wnl;  TSH=1.20;  UA=ok...  ~  April 09, 2013:  58mo ROV & Heidi [name] is doing well & she had a good interval w/o new complaints or concerns; recently treated by Podiatry for left achilles tendonitis w/ 2 shots and pred, rec for PT but she's doing her own exercises; note that she's gained 6# & asked to consider bike or other non-weight-bearing exercise + diet etc... We reviewed the following medical problems during  today's office visit >>     HBP> on ASA 81, Losartan/Hct 100/12.5-1/2 tab;  BP= 124/82 & she denies CP, palpit, SOB, dizzy, edema, etc...    Ven Insuffic> reminded to elim salt, elev legs, wear support hose, etc...    CHOL> on Prav40-1/2tab & tol well; FLP 12/13 showed TChol 169, TG 71, HDL 49, LDL 106    Overweight> wt is up 6# to 191#, BMI= 31-2, but she is excited about new juicer 7 she is doing this for 31meal/d...    GI> GERD, IBS> she takes Flax seed in [place]; states bowels much better- denies n/v, c/d, blood seen; she also take a number of supplements per [name]...    Hx Renal cell ca> s/p right nephrectomy 2001; no known recurrence; renal function remains normal w/ labs 12/13 showing BUN=15, Creat=1.1    DJD> using OTC meds and Tylenol; rec to continue exercise program & home therapy; developed achilles tendonitis 5/14 w/ shots & Pred per Podiatry.    Vit D defic> on VitD50K every other week per Gyn... We reviewed prob list, meds, xrays and labs> see below for updates >>   ~  October 13, 2013:  76mo ROV & Heidi [name] continues to do well- no new complaints or concerns... She takes nutritional shakes daily...    BP  controlled on Hyzaar 100-12.5 daily & BP= 118/68 today w/o HAs, visualsymptoms, CP, palpit, SOB, edema, etc...    Chol is treated w/ diet & Prav40-1/2 daily; FLP not quite at goals w/ LDL=126 but she does not want to incr the dose; we reviewed diet & exercise...    Her weight is 187# w/ BMI~31; we reviewed wt reducing diet strategies...    Hx renal cell ca w/ right nephrectomy 2001, no known recurrence...     She takes a number of supplements... We reviewed prob list, meds, xrays and labs> see below for updates >> she had the 2014 Flu vaccine in Nov... meds refilled per request... LABS 12/14:  FLP- ok x LDL=126 on Prav20;  Chems- wnl;  CBC- wnl;  TSH=1.91;  VitD=56...   ~  April 13, 2014:  23mo ROV & Heidi [name] reports that she is doing great on a new diet> Dr.IanKSmith's "super  shredded diet" + exercise program (walking, tapes, & yoga); she actually lost 20# but recently gained back 10 to 178# today & she wants to get into the 160's...     HBP> on ASA 81, Losartan/Hct 100/12.5-1/2 tab;  BP= 128/74 & she denies CP, palpit, SOB, dizzy, edema, etc...    Ven Insuffic> there is no edema & she is reminded to elim salt, elev legs, wear support hose, etc...    CHOL> on Prav40-1/2tab & tol well; FLP 12/14 showed TChol 189, TG 60, HDL 51, LDL 126... She knows to elim fat & keep wt down.    Overweight> wt is down 10# to 178#, BMI= 30, but she is excited about her new diet program called the "super shredded diet"...    GI> GERD, IBS> she takes Flax seed in [place]; has lactose intol but notes that peppermint tea helps- denies n/v, c/d, blood seen; last colon was 11/14 by [name] & neg; she also take a number of supplements per [name]...    Hx Renal cell ca> s/p right nephrectomy 2001; no known recurrence; renal function remains normal w/ labs 12/14 showing BUN=15, Creat=1.0    DJD> using OTC meds and Tylenol; rec to continue exercise program & home therapy; developed achilles tendonitis 5/14 w/ shots & Pred per Podiatry.    Vit D defic> on VitD OTC supplement w/ VitD level 2/15 per GYN= 49 We reviewed prob list, meds, xrays and labs> see below for updates >>            Problem List:   HYPERTENSION (ICD-401.9) - controlled on ASA 81mg /d, & LOSARTAN/ HCT 100-12.5 taking 1/2 tab daily...  ~  6/12: BP=128/78 and tol Rx well... she denies HA, fatigue, visual changes, CP, palipit, dizziness, syncope, dyspnea, edema, etc... ~  12/12: BP= 124/72 & doing well w/o symptoms... ~  6/13:  BP= 118/68 & she denies CP, palpit, SOB, dizzy, edema, etc... ~  12/13: on ASA 81, Losartan/Hct 100/12.5-1/2;  BP= 118/74 & she denies CP, palpit, SOB, dizzy, edema, etc...  ~  CXR 12/13 showed norm heart size, clear lungs, wnl/ NAD.Marland KitchenMarland Kitchen EKG 12/13 showed SBrady, rate54, wnl/ NAD... ~  6/14: on ASA 81,  Losartan/Hct 100/12.5-1/2 tab;  BP= 124/82 & she denies CP, palpit, SOB, dizzy, edema, etc ~  12/14: BP controlled on Hyzaar 100-12.5 daily & BP= 118/68 today w/o HAs, visualsymptoms, CP, palpit, SOB, edema, etc. ~  6/15: BP= 128/74 on Hyzaar 100-12.5 taking 1/2 tab daily...  VENOUS INSUFFICIENCY (ICD-459.81) - low salt diet, support hose when nec... she reports hammer toe  per podiatry but she doesn't want surg...  HYPERCHOLESTEROLEMIA (ICD-272.0) - We discussed diet +exercise to get weight down... ~  [place] 3/09 showed TChol 198, TG 80, HDL 59, LDL 123... not at goal- start Simvast20/d. ~  FLP 9/09 showed TChol 139, TG 74, HDL 47, LDL 77... doing well on Simva20, but "intol" per pt, refer to [facility]. ~  FLP 3/10 on Crestor2.5 showed TChol 151, TG 78, HDL 54, LDL 82... continue same & [facility]. ~  FLP 9/10 showed TChol 159, TG 64, HDL 54, LDL 93 ~  FLP 12/10 showed TChol 164, TG 74, HDL 51, LDL 98 ~  FLP 3/11 showed TChol 163, TG 73, HDL 56, LDL 92 ~  FLP 12/11 on Cres2.5 showed TChol 172, TG 69, HDL 50, LDL 108... We reviewed diet & exercise... ~  FLP 6/12 on Cres2.5 showed TChol 165, TG 75, HDL 48, LDL 102... she requests change meds due to cost> try PRAV40 (she decided on 1/2) ~  [place] 12/12 on Cres2.5 showed TChol 173, TG 85, HDL 50, LDL 106... Hasn't switched to the PRAV40 yet! ~  [place] 6/13 on Prav40-1/2 showed TChol 189, TG 56, HDL 53, LDL 125... rec to take Qhs, get on diet, get wt down... ~  [place] 12/13 on Prav40-1/2 showed TChol 169, TG 71, HDL 49, LDL 106  ~  FLP 12/14 on Prav40-1/2 showed TChol 189, TG 60, HDL 51, LDL 126... She does not want to incr the Prav, discussed diet & wt reduction.  GERD (ICD-530.81) IRRITABLE BOWEL SYNDROME (ICD-564.1) - followed by [name] and notes reviewed... he rec increased fiber, Benefiber, Robinul as needed... she prefers therapy from an herbalist and taking "daily cleanse pill" which helped; now she takes 2tablesp flax seeds in yogurt daily & improved... ~   prev colonoscopy 9/01 by [name] was WNL.Marland Kitchen.  ~  f/u colonoscopy 11/09 by [name] was neg- WNL, f/u planned 6yrs... ~  F/u colonoscopy 11/14 by [name] was neg- norm colon & f/u exam suggested in 62yrs...   Hx of CARCINOMA, RENAL CELL (ICD-189.0) - s/p right nephrectomy 2001 at [facility] by [facility]... he signed off in 2006 and exam/ CXR's have been neg-  ~  f/u CXR 9/09 was clear, WNL... ~  12/10:  we discussed CXR (neg- WNL) & full labs (OK)& she will return for Fasting blood work... ~  12/11:  CXR remains neg- clear, WNL... Renal function normal w/ BUN=14, Creat=1.0 ~  12/12:  CXR remains clear, no lesions & labs similarly normal... Renal function normal w/ BUN=16, Creat=1.1 ~  12/13:  CXR showed norm heart size, clear lungs, NAD;  Renal function remains norm w/ BUN= 15, Cr= 1.0  DEGENERATIVE JOINT DISEASE (ICD-715.90) - she's improved w/ exercise program... but c/o bilat R>L knee discomfort w/ eval by Ortho (?who) w/ XRays and shot... doesn't want Rx- using OTC anti-inflamm + Tylenol... She notes that elliptical exercise helps her knees... ~  [facility] all wnl... ~  Vit D level ~50 on supplements OTC per Gyn...  Hx of HEADACHE (ICD-784.0)  ANXIETY (ICD-300.00) - she does not wish to have her Alpraz refilled...  Health Maintenance - GYN= [address] and she does PAPs and checked Vit D level on pt (pt reports taking 50000 u Vit D every other day now w/ her Vit D level = 55 during Dec11 OV)... she notes sister dx w/ breast cancer 2010 & she wants to wean off her hormone Rx, & has discussed  this w/ Gyn... she gets regular Mammograms (last 1/14= neg) & [facility]> BMD 12/11 was WNL & she remains on Calcium, MVI, Vit D... ~  Immunizations:  Given TDAP here 2/12... Encouraged to get the yearly Flu vaccine... Given PNEUMOVAX 6/13 (66th birthday)...   Past Surgical History  Procedure Laterality Date  . Nephrectomy  2001    right = for renal cell  [place]  . Total abdominal hysterectomy      Outpatient Encounter Prescriptions as of 04/13/2014  Medication Sig  . ALPHA LIPOIC ACID PO Take by mouth daily.    Marland Kitchen aspirin 81 MG tablet Take 81 mg by mouth daily.    . Biotin 10 MG TABS Take by mouth daily.    . Coenzyme Q10 (COQ10) 100 MG CAPS Take by mouth daily.    . ergocalciferol (VITAMIN D2) 50000 UNITS capsule Take 1 capsule (50,000 Units total) by mouth every 14 (fourteen) days. Every other week  . Evening Primrose Oil 1000 MG CAPS Take by mouth daily.    Marland Kitchen losartan-hydrochlorothiazide (HYZAAR) 100-12.5 MG per tablet Take 1/2 tablet by mouth daily  . Multiple Vitamin (MULTIVITAMIN) capsule Take 1 capsule by mouth daily.    . pravastatin (PRAVACHOL) 40 MG tablet Take 1/2 tablet by mouth daily    No Known Allergies   Current Medications, Allergies, Past Medical History, Past Surgical History, Family History, and Social History were reviewed in [employer] record.    Review of Systems        See HPI - all other systems neg except as noted... The patient denies anorexia, fever, weight loss, weight gain, vision loss, decreased hearing, hoarseness, chest pain, syncope, dyspnea on exertion, peripheral edema, prolonged cough, headaches, hemoptysis, abdominal pain, melena, hematochezia, severe indigestion/heartburn, hematuria, incontinence, muscle weakness, suspicious skin lesions, transient blindness, difficulty walking, depression, unusual weight change, abnormal bleeding, enlarged lymph nodes, and angioedema.     Objective:   Physical Exam     WD, Overweight, 68 y/o BF in NAD... GENERAL:  Alert & oriented; pleasant & cooperative... HEENT:  [place]/AT, EOM-wnl, PERRLA, EACs-clear, TMs-wnl, NOSE-clear, THROAT-clear & wnl. NECK:  Supple w/ fairROM; no JVD; normal carotid impulses w/o bruits; no thyromegaly or nodules palpated; no lymphadenopathy. CHEST:  Clear to P & A; without wheezes/ rales/ or rhonchi. HEART:  Regular  Rhythm; without murmurs/ rubs/ or gallops. ABDOMEN:  Soft & nontender; normal bowel sounds; no organomegaly or masses detected. EXT: without deformities, mod arthritic changes w/ bilat knee crepitus on exam;  no varicose veins/ +venous insuffic/ no edema. NEURO:  CN's intact; motor testing normal; sensory testing normal; gait normal & balance OK. DERM:  No lesions noted; no rash etc... flower tatoo over right breast area...  RADIOLOGY DATA:  Reviewed in the EPIC EMR & discussed w/ the patient...  LABORATORY DATA:  Reviewed in the EPIC EMR & discussed w/ the patient...   Assessment & Plan:    HBP>  Controlled on Hyzaar, continue same + diet, exercise, get wt down...  CHOL>  FLP fair on PRAVASTATIN 40mg -1/2 w/ LDL = 126; she is not inclined to incr med to 1 tab daily, therefore continue same + better diet...  GI>  GERD/ IBS>  States her bowels are better on flax seeds in yogurt per [name]...  GU>  s/p right nephrectomy for renal cell ca in 2001; renal funct remains WNL w/ Creat ~1.1 & CXR remains clear...  DJD>  She does elliptical exercises & states this helps her  knees...  Other medical issues as noted...   Patient's Medications  New Prescriptions   No medications on file  Previous Medications   ALPHA LIPOIC ACID PO    Take by mouth daily.     ASPIRIN 81 MG TABLET    Take 81 mg by mouth daily.     BIOTIN 10 MG TABS    Take by mouth daily.     COENZYME Q10 (COQ10) 100 MG CAPS    Take by mouth daily.     ERGOCALCIFEROL (VITAMIN D2) 50000 UNITS CAPSULE    Take 1 capsule (50,000 Units total) by mouth every 14 (fourteen) days. Every other week   EVENING PRIMROSE OIL 1000 MG CAPS    Take by mouth daily.     MULTIPLE VITAMIN (MULTIVITAMIN) CAPSULE    Take 1 capsule by mouth daily.    Modified Medications   Modified Medication Previous Medication   LOSARTAN-HYDROCHLOROTHIAZIDE (HYZAAR) 100-12.5 MG PER TABLET losartan-hydrochlorothiazide (HYZAAR) 100-12.5 MG per tablet      Take 1/2  tablet by mouth daily    Take 1/2 tablet by mouth daily   PRAVASTATIN (PRAVACHOL) 40 MG TABLET pravastatin (PRAVACHOL) 40 MG tablet      Take 1/2 tablet by mouth daily    Take 1/2 tablet by mouth daily  Discontinued Medications   No medications on file

## 2014-04-13 NOTE — Patient Instructions (Signed)
Today we updated your med list in our EPIC system...    Continue your current medications the same...  We wrote a prescription for the Shingles vaccine- you may get this shot at [facility] or [facility] at your convenience...  Call for any questions...  Let's plan a follow up visit in 48mo w/ FASTING blood work at that time.Marland KitchenMarland Kitchen

## 2014-08-03 ENCOUNTER — Telehealth: Payer: Self-pay | Admitting: Pulmonary Disease

## 2014-08-03 NOTE — Telephone Encounter (Signed)
Called and LMTCB x1 at ext left

## 2014-08-04 NOTE — Telephone Encounter (Signed)
Vaughan Basta, RN from [facility] called back and she stated that she was following up with the pt---she stated that the   Losartan/hct 100/12.5   1/2 daily  And she filled this June, august and October and did not fill for July and sept.   Pravastatin  40  1/2 daily and she filled this June and Sept but did not fill in July and august.    Vaughan Basta wanted to make sure SN was aware.  Will forward to SN as FYI.    No Known Allergies  Current Outpatient Prescriptions on File Prior to Visit  Medication Sig Dispense Refill  . ALPHA LIPOIC ACID PO Take by mouth daily.        Marland Kitchen aspirin 81 MG tablet Take 81 mg by mouth daily.        . Biotin 10 MG TABS Take by mouth daily.        . Coenzyme Q10 (COQ10) 100 MG CAPS Take by mouth daily.        . ergocalciferol (VITAMIN D2) 50000 UNITS capsule Take 1 capsule (50,000 Units total) by mouth every 14 (fourteen) days. Every other week  30 capsule  3  . Evening Primrose Oil 1000 MG CAPS Take by mouth daily.        Marland Kitchen losartan-hydrochlorothiazide (HYZAAR) 100-12.5 MG per tablet Take 1/2 tablet by mouth daily  15 tablet  6  . Multiple Vitamin (MULTIVITAMIN) capsule Take 1 capsule by mouth daily.        . pravastatin (PRAVACHOL) 40 MG tablet Take 1/2 tablet by mouth daily  15 tablet  6   No current facility-administered medications on file prior to visit.

## 2014-08-04 NOTE — Telephone Encounter (Signed)
Called and LM for Vaughan Basta to call back at ext [phone]

## 2014-08-06 NOTE — Telephone Encounter (Signed)
i have called and lmomtcb x 2 for the pt.    Per SN---  [place] nurse called [place] to let us know that pt has not been taking her meds daily.   SN just wants to make sure that the pt has been taking her medications regularly.  Would it be easier if we changed her to 1 tablet daily of her medications.

## 2014-08-06 NOTE — Telephone Encounter (Signed)
lmomtcb x1 

## 2014-08-06 NOTE — Telephone Encounter (Signed)
Pt returned call & can be reached at [phone].  Satira Anis

## 2014-08-06 NOTE — Telephone Encounter (Signed)
i called and spoke with pt and she stated that she takes her medications daily.  She did state that she only takes 1/2 tablet and she has spoken with the [place] nurse as well and has told her this.  Pt stated that she wanted SN to be aware that she does take her medications every day.  Nothing further is needed.

## 2014-08-06 NOTE — Telephone Encounter (Signed)
Leigh please advise what we are letting the patient know?? Pt is returning your call.

## 2014-08-06 NOTE — Telephone Encounter (Signed)
Pt called back & can be reached at [phone].  Heidi [name]

## 2014-08-06 NOTE — Telephone Encounter (Signed)
Pt returned Leigh's call & can be reached at [phone]. Satira Anis

## 2014-08-11 ENCOUNTER — Other Ambulatory Visit: Payer: Self-pay | Admitting: Pulmonary Disease

## 2014-08-11 MED ORDER — LOSARTAN POTASSIUM-HCTZ 100-12.5 MG PO TABS: ORAL_TABLET | ORAL | Status: DC

## 2014-08-24 ENCOUNTER — Encounter: Payer: Self-pay | Admitting: Pulmonary Disease

## 2014-09-10 ENCOUNTER — Telehealth: Payer: Self-pay | Admitting: Pulmonary Disease

## 2014-09-10 MED ORDER — CIPROFLOXACIN HCL 500 MG PO TABS: 500.0000 mg | ORAL_TABLET | Freq: Two times a day (BID) | ORAL | Status: DC

## 2014-09-10 NOTE — Telephone Encounter (Signed)
Pt c/o urinary frequency and left flank pain. Pt reports some dizziness, not sure if d/t head cold or related to UTI. Pt denies any discoloration of urine or foul smell.  No Known Allergies   Please advise Dr Lenna Gilford. Thanks. Current Outpatient Prescriptions on File Prior to Visit  Medication Sig Dispense Refill  . ALPHA LIPOIC ACID PO Take by mouth daily.      Marland Kitchen aspirin 81 MG tablet Take 81 mg by mouth daily.      . Biotin 10 MG TABS Take by mouth daily.      . Coenzyme Q10 (COQ10) 100 MG CAPS Take by mouth daily.      . ergocalciferol (VITAMIN D2) 50000 UNITS capsule Take 1 capsule (50,000 Units total) by mouth every 14 (fourteen) days. Every other week 30 capsule 3  . Evening Primrose Oil 1000 MG CAPS Take by mouth daily.      Marland Kitchen losartan-hydrochlorothiazide (HYZAAR) 100-12.5 MG per tablet Take 1/2 tablet by mouth daily 45 tablet 1  . Multiple Vitamin (MULTIVITAMIN) capsule Take 1 capsule by mouth daily.      . pravastatin (PRAVACHOL) 40 MG tablet Take 1/2 tablet by mouth daily 15 tablet 6   No current facility-administered medications on file prior to visit.

## 2014-09-10 NOTE — Telephone Encounter (Signed)
Per SN---  Try cipro 500 mg  #14  1 po bid thanks

## 2014-09-10 NOTE — Telephone Encounter (Signed)
Called and spoke to pt. Informed pt of the recs per SN. Rx sent to preferred pharmacy. Pt verbalized understanding and denied any further questions or concerns at this time.  

## 2014-10-14 ENCOUNTER — Encounter (INDEPENDENT_AMBULATORY_CARE_PROVIDER_SITE_OTHER): Payer: Self-pay

## 2014-10-14 ENCOUNTER — Ambulatory Visit (INDEPENDENT_AMBULATORY_CARE_PROVIDER_SITE_OTHER)
Admission: RE | Admit: 2014-10-14 | Discharge: 2014-10-14 | Disposition: A | Discharge status: Home / Self Care | Payer: Medicare HMO | Source: Ambulatory Visit | Attending: Pulmonary Disease | Admitting: Pulmonary Disease

## 2014-10-14 ENCOUNTER — Ambulatory Visit (INDEPENDENT_AMBULATORY_CARE_PROVIDER_SITE_OTHER): Payer: Medicare HMO | Admitting: Pulmonary Disease

## 2014-10-14 ENCOUNTER — Encounter: Payer: Self-pay | Admitting: Pulmonary Disease

## 2014-10-14 ENCOUNTER — Other Ambulatory Visit (INDEPENDENT_AMBULATORY_CARE_PROVIDER_SITE_OTHER): Payer: Medicare HMO

## 2014-10-14 VITALS — BP 118/76 | HR 72 | Temp 96.8°F | Ht 65.5 in | Wt 184.2 lb

## 2014-10-14 DIAGNOSIS — K219 Gastro-esophageal reflux disease without esophagitis: Secondary | ICD-10-CM

## 2014-10-14 DIAGNOSIS — M150 Primary generalized (osteo)arthritis: Secondary | ICD-10-CM

## 2014-10-14 DIAGNOSIS — I872 Venous insufficiency (chronic) (peripheral): Secondary | ICD-10-CM

## 2014-10-14 DIAGNOSIS — E7800 Pure hypercholesterolemia, unspecified: Secondary | ICD-10-CM

## 2014-10-14 DIAGNOSIS — M159 Polyosteoarthritis, unspecified: Secondary | ICD-10-CM

## 2014-10-14 DIAGNOSIS — K589 Irritable bowel syndrome without diarrhea: Secondary | ICD-10-CM

## 2014-10-14 DIAGNOSIS — I10 Essential (primary) hypertension: Secondary | ICD-10-CM

## 2014-10-14 DIAGNOSIS — Z23 Encounter for immunization: Secondary | ICD-10-CM

## 2014-10-14 DIAGNOSIS — F411 Generalized anxiety disorder: Secondary | ICD-10-CM

## 2014-10-14 DIAGNOSIS — C641 Malignant neoplasm of right kidney, except renal pelvis: Secondary | ICD-10-CM

## 2014-10-14 LAB — CBC WITH DIFFERENTIAL/PLATELET
BASOS ABS: 0.1 10*3/uL (ref 0.0–0.1)
Basophils Relative: 0.7 % (ref 0.0–3.0)
Eosinophils Absolute: 0.1 10*3/uL (ref 0.0–0.7)
Eosinophils Relative: 1.9 % (ref 0.0–5.0)
HEMATOCRIT: 41.6 % (ref 36.0–46.0)
Hemoglobin: 13.4 g/dL (ref 12.0–15.0)
LYMPHS ABS: 2.5 10*3/uL (ref 0.7–4.0)
LYMPHS PCT: 34.8 % (ref 12.0–46.0)
MCHC: 32.1 g/dL (ref 30.0–36.0)
MCV: 91.6 fl (ref 78.0–100.0)
MONOS PCT: 8.6 % (ref 3.0–12.0)
Monocytes Absolute: 0.6 10*3/uL (ref 0.1–1.0)
NEUTROS PCT: 54 % (ref 43.0–77.0)
Neutro Abs: 3.9 10*3/uL (ref 1.4–7.7)
PLATELETS: 329 10*3/uL (ref 150.0–400.0)
RBC: 4.55 Mil/uL (ref 3.87–5.11)
RDW: 14.8 % (ref 11.5–15.5)
WBC: 7.2 10*3/uL (ref 4.0–10.5)

## 2014-10-14 LAB — VITAMIN D 25 HYDROXY (VIT D DEFICIENCY, FRACTURES): VITD: 40.05 ng/mL (ref 30.00–100.00)

## 2014-10-14 LAB — HEPATIC FUNCTION PANEL
ALBUMIN: 4.1 g/dL (ref 3.5–5.2)
ALT: 13 U/L (ref 0–35)
AST: 16 U/L (ref 0–37)
Alkaline Phosphatase: 74 U/L (ref 39–117)
Bilirubin, Direct: 0.1 mg/dL (ref 0.0–0.3)
TOTAL PROTEIN: 7.7 g/dL (ref 6.0–8.3)
Total Bilirubin: 0.5 mg/dL (ref 0.2–1.2)

## 2014-10-14 LAB — LIPID PANEL
Cholesterol: 176 mg/dL (ref 0–200)
HDL: 43.1 mg/dL (ref >39.00)
LDL Cholesterol: 118 mg/dL — ABNORMAL HIGH (ref 0–99)
NONHDL: 132.9
Total CHOL/HDL Ratio: 4
Triglycerides: 75 mg/dL (ref 0.0–149.0)
VLDL: 15 mg/dL (ref 0.0–40.0)

## 2014-10-14 LAB — BASIC METABOLIC PANEL
BUN: 15 mg/dL (ref 6–23)
CHLORIDE: 107 meq/L (ref 96–112)
CO2: 27 mEq/L (ref 19–32)
Calcium: 9.5 mg/dL (ref 8.4–10.5)
Creatinine, Ser: 1 mg/dL (ref 0.4–1.2)
GFR: 68.64 mL/min (ref >60.00)
GLUCOSE: 91 mg/dL (ref 70–99)
POTASSIUM: 4.6 meq/L (ref 3.5–5.1)
Sodium: 142 mEq/L (ref 135–145)

## 2014-10-14 LAB — TSH: TSH: 1.8 u[IU]/mL (ref 0.35–4.50)

## 2014-10-14 MED ORDER — PRAVASTATIN SODIUM 40 MG PO TABS: ORAL_TABLET | ORAL | Status: DC

## 2014-10-14 MED ORDER — LOSARTAN POTASSIUM-HCTZ 100-12.5 MG PO TABS: ORAL_TABLET | ORAL | Status: DC

## 2014-10-14 NOTE — Progress Notes (Signed)
Subjective:    Patient ID: Heidi [name], female    DOB: 09-09-1946, 68 y.o.   MRN: [id number]  HPI 68 y/o BF here for a follow up visit... she has mult med problems as noted below...  SEE PREV EPIC NOTES FOR EARLIER DATA >>  ~  October 11, 2012:  19mo ROV & her CC= sinus HA, frontal area, congested, draining, w/o f/s/c; states improved w/ OTC (coffee & Advil) & she declines my offer to rx w/ antibiotic, cortisone, pain med, etc... Also her sis says sleep apnea- offered sleep study but she declines (wakes refreshed & denies daytime sleepiness)...    HBP> on ASA 81, Losartan/Hct 100/12.5-1/2;  BP= 118/74 & she denies CP, palpit, SOB, dizzy, edema, etc...    Ven Insuffic> reminded to elim salt, elev legs, wear support hose...    CHOL> on Prav40-1/2 now & tol well; FLP showed TChol 169, TG 71, HDL 49, LDL 106    GI> GERD, IBS> she takes Flax seed in [place]; states bowels much better- denies n/v, c/d, blood seen; she also take a number of supplements per [name]...    Hx Renal cell ca> s/p right nephrectomy 2001; no known recurrence; renal function remains normal w/ BUN=15, Creat=1.1    DJD> using OTC meds and Tylenol; rec to continue exercise program & home therapy...    Vit D defic> on VitD50K every other week per Gyn;  We reviewed prob list, meds, xrays and labs> see below for updates >> we gave her the 2013 flu vaccine today... CXR 12/13 showed normal heart size, clear lungs, NAD.Marland Kitchen EKG 12/13 showed SBrady, rate54, WNL, NAD... LABS 12/13:  FLP- ok on Prav40 w/ LDL=106;  Chems- wnl;  CBC- wnl;  TSH=1.20;  UA=ok...  ~  April 09, 2013:  51mo ROV & Heidi [name] is doing well & she had a good interval w/o new complaints or concerns; recently treated by Podiatry for left achilles tendonitis w/ 2 shots and pred, rec for PT but she's doing her own exercises; note that she's gained 6# & asked to consider bike or other non-weight-bearing exercise + diet etc... We reviewed the following medical problems during  today's office visit >>     HBP> on ASA 81, Losartan/Hct 100/12.5-1/2 tab;  BP= 124/82 & she denies CP, palpit, SOB, dizzy, edema, etc...    Ven Insuffic> reminded to elim salt, elev legs, wear support hose, etc...    CHOL> on Prav40-1/2tab & tol well; FLP 12/13 showed TChol 169, TG 71, HDL 49, LDL 106    Overweight> wt is up 6# to 191#, BMI= 31-2, but she is excited about new juicer 7 she is doing this for 40meal/d...    GI> GERD, IBS> she takes Flax seed in [place]; states bowels much better- denies n/v, c/d, blood seen; she also take a number of supplements per [name]...    Hx Renal cell ca> s/p right nephrectomy 2001; no known recurrence; renal function remains normal w/ labs 12/13 showing BUN=15, Creat=1.1    DJD> using OTC meds and Tylenol; rec to continue exercise program & home therapy; developed achilles tendonitis 5/14 w/ shots & Pred per Podiatry.    Vit D defic> on VitD50K every other week per Gyn... We reviewed prob list, meds, xrays and labs> see below for updates >>   ~  October 13, 2013:  9mo ROV & Heidi [name] continues to do well- no new complaints or concerns... She takes nutritional shakes daily...    BP  controlled on Hyzaar 100-12.5 daily & BP= 118/68 today w/o HAs, visualsymptoms, CP, palpit, SOB, edema, etc...    Chol is treated w/ diet & Prav40-1/2 daily; FLP not quite at goals w/ LDL=126 but she does not want to incr the dose; we reviewed diet & exercise...    Her weight is 187# w/ BMI~31; we reviewed wt reducing diet strategies...    Hx renal cell ca w/ right nephrectomy 2001, no known recurrence...     She takes a number of supplements... We reviewed prob list, meds, xrays and labs> see below for updates >> she had the 2014 Flu vaccine in Nov... meds refilled per request... LABS 12/14:  FLP- ok x LDL=126 on Prav20;  Chems- wnl;  CBC- wnl;  TSH=1.91;  VitD=56...   ~  April 13, 2014:  31mo ROV & Heidi [name] reports that she is doing great on a new diet> Dr.IanKSmith's "super  shredded diet" + exercise program (walking, tapes, & yoga); she actually lost 20# but recently gained back 10 to 178# today & she wants to get into the 160's...     HBP> on ASA 81, Losartan/Hct 100/12.5-1/2 tab;  BP= 128/74 & she denies CP, palpit, SOB, dizzy, edema, etc...    Ven Insuffic> there is no edema & she is reminded to elim salt, elev legs, wear support hose, etc...    CHOL> on Prav40-1/2tab & tol well; FLP 12/14 showed TChol 189, TG 60, HDL 51, LDL 126... She knows to elim fat & keep wt down.    Overweight> wt is down 10# to 178#, BMI= 30, but she is excited about her new diet program called the "super shredded diet"...    GI> GERD, IBS> she takes Flax seed in [place]; has lactose intol but notes that peppermint tea helps- denies n/v, c/d, blood seen; last colon was 11/14 by [name] & neg; she also take a number of supplements per [name]...    Hx Renal cell ca> s/p right nephrectomy 2001; no known recurrence; renal function remains normal w/ labs 12/14 showing BUN=15, Creat=1.0    DJD> using OTC meds and Tylenol; rec to continue exercise program & home therapy; developed achilles tendonitis 5/14 w/ shots & Pred per Podiatry.    Vit D defic> on VitD OTC supplement w/ VitD level 2/15 per GYN= 49 We reviewed prob list, meds, xrays and labs> see below for updates >>   ~  October 14, 2014:  54mo ROV & Heidi [name] reports doing satis just occas bladder infections and she will have this checked by Gyn soon (s/p Hyst yrs ago)... We reviewed the following medical problems during today's office visit >>     HBP> on ASA 81, Losartan/Hct 100/12.5-1/2 tab;  BP= 118/76 & she denies CP, palpit, SOB, dizzy, edema, etc...    Ven Insuffic> there is no edema & she is reminded to elim salt, elev legs, wear support hose, etc...    CHOL> on Prav40-1/2tab & tol well; FLP 12/15 showed TChol 176, TG 75, HDL 43, LDL 118... She knows to elim fat & keep wt down.    Overweight> wt is down 7# to 177#, BMI= 30, on her  diet program called the "super shredded diet"...    GI> GERD, IBS> she takes Flax seed in [place]; has lactose intol but notes that peppermint tea helps- denies n/v, c/d, blood seen; last colon was 11/14 by [name] & neg; she also take a number of supplements per [name]...    Hx  Renal cell ca> s/p right nephrectomy 2001; no known recurrence; renal function remains normal w/ labs 12/15 showing BUN=15, Creat=1.0 (stable).    DJD> using OTC meds and Tylenol; rec to continue exercise program & home therapy; developed achilles tendonitis 5/14 w/ shots & Pred per Podiatry.    Vit D defic> on VitD OTC supplement w/ VitD level 12/15 = 40 We reviewed prob list, meds, xrays and labs> see below for updates >>  OK 2015 Flu vaccine today...  CXR 12/15 showed norm heart size, clear lungs, NAD.Marland KitchenMarland Kitchen   EKG 12/15 showed sinus brady, rate52, otherw wnl, NAD...  LABS 12/15:  FLP- ok x LDL=118;  Chems- wnl;  CBC- wnl;  TSH=1.80;  VitD=40...          Problem List:   HYPERTENSION (ICD-401.9) - controlled on ASA 81mg /d, & LOSARTAN/ HCT 100-12.5 taking 1/2 tab daily...  ~  6/12: BP=128/78 and tol Rx well... she denies HA, fatigue, visual changes, CP, palipit, dizziness, syncope, dyspnea, edema, etc... ~  12/12: BP= 124/72 & doing well w/o symptoms... ~  6/13:  BP= 118/68 & she denies CP, palpit, SOB, dizzy, edema, etc... ~  12/13: on ASA 81, Losartan/Hct 100/12.5-1/2;  BP= 118/74 & she denies CP, palpit, SOB, dizzy, edema, etc...  ~  CXR 12/13 showed norm heart size, clear lungs, wnl/ NAD.Marland KitchenMarland Kitchen EKG 12/13 showed SBrady, rate54, wnl/ NAD... ~  6/14: on ASA 81, Losartan/Hct 100/12.5-1/2 tab;  BP= 124/82 & she denies CP, palpit, SOB, dizzy, edema, etc ~  12/14: BP controlled on Hyzaar 100-12.5 daily & BP= 118/68 today w/o HAs, visualsymptoms, CP, palpit, SOB, edema, etc. ~  6/15: BP= 128/74 on Hyzaar 100-12.5 taking 1/2 tab daily... ~  12/15: on ASA 81, Losartan/Hct 100/12.5-1/2 tab;  BP= 118/76 & she denies CP,  palpit, SOB, dizzy, edema, etc  VENOUS INSUFFICIENCY (ICD-459.81) - low salt diet, support hose when nec... she reports hammer toe per podiatry but she doesn't want surg...  HYPERCHOLESTEROLEMIA (ICD-272.0) - We discussed diet +exercise to get weight down... ~  [place] 3/09 showed TChol 198, TG 80, HDL 59, LDL 123... not at goal- start Simvast20/d. ~  FLP 9/09 showed TChol 139, TG 74, HDL 47, LDL 77... doing well on Simva20, but "intol" per pt, refer to [facility]. ~  FLP 3/10 on Crestor2.5 showed TChol 151, TG 78, HDL 54, LDL 82... continue same & [facility]. ~  FLP 9/10 showed TChol 159, TG 64, HDL 54, LDL 93 ~  FLP 12/10 showed TChol 164, TG 74, HDL 51, LDL 98 ~  FLP 3/11 showed TChol 163, TG 73, HDL 56, LDL 92 ~  FLP 12/11 on Cres2.5 showed TChol 172, TG 69, HDL 50, LDL 108... We reviewed diet & exercise... ~  FLP 6/12 on Cres2.5 showed TChol 165, TG 75, HDL 48, LDL 102... she requests change meds due to cost> try PRAV40 (she decided on 1/2) ~  [place] 12/12 on Cres2.5 showed TChol 173, TG 85, HDL 50, LDL 106... Hasn't switched to the PRAV40 yet! ~  [place] 6/13 on Prav40-1/2 showed TChol 189, TG 56, HDL 53, LDL 125... rec to take Qhs, get on diet, get wt down... ~  [place] 12/13 on Prav40-1/2 showed TChol 169, TG 71, HDL 49, LDL 106  ~  FLP 12/14 on Prav40-1/2 showed TChol 189, TG 60, HDL 51, LDL 126... She does not want to incr the Prav, discussed diet & wt reduction. ~  FLP 12/15 on Prav40-1/2 showed TChol 176, TG 75, HDL 43,  LDL 118  GERD (ICD-530.81) IRRITABLE BOWEL SYNDROME (ICD-564.1) - followed by [name] and notes reviewed... he rec increased fiber, Benefiber, Robinul as needed... she prefers therapy from an herbalist and taking "daily cleanse pill" which helped; now she takes 2tablesp flax seeds in yogurt daily & improved... ~  prev colonoscopy 9/01 by [name] was WNL.Marland Kitchen.  ~  f/u colonoscopy 11/09 by [name] was neg- WNL, f/u planned 59yrs... ~  F/u colonoscopy 11/14 by [name] was neg- norm colon & f/u  exam suggested in 75yrs...   Hx of CARCINOMA, RENAL CELL (ICD-189.0) - s/p right nephrectomy 2001 at [facility] by [facility]... he signed off in 2006 and exam/ CXR's have been neg-  ~  f/u CXR 9/09 was clear, WNL... ~  12/10:  we discussed CXR (neg- WNL) & full labs (OK)& she will return for Fasting blood work... ~  12/11:  CXR remains neg- clear, WNL... Renal function normal w/ BUN=14, Creat=1.0 ~  12/12:  CXR remains clear, no lesions & labs similarly normal... Renal function normal w/ BUN=16, Creat=1.1 ~  12/13:  CXR showed norm heart size, clear lungs, NAD;  Renal function remains norm w/ BUN= 15, Cr= 1.1 ~  12/14:  Labs remain stable with BUN= 15, Cr= 1.0 ~  12/15:  CXR showed norm heart size, clear lungs, NAD;  Renal function remains normal w/ BUN= 15, Cr= 1.0  DEGENERATIVE JOINT DISEASE (ICD-715.90) - she's improved w/ exercise program... but c/o bilat R>L knee discomfort w/ eval by Ortho (?who) w/ XRays and shot... doesn't want Rx- using OTC anti-inflamm + Tylenol... She notes that elliptical exercise helps her knees... ~  [facility] all wnl... ~  Vit D level ~50 on supplements OTC per Gyn... ~  Labs here 12/15 showed Vit D = 40  Hx of HEADACHE (ICD-784.0)  ANXIETY (ICD-300.00) - she does not wish to have her Alpraz refilled...  Health Maintenance - GYN= [place] [address] and she does PAPs and checked Vit D level on pt (pt reports taking 50000 u Vit D every other day now w/ her Vit D level = 55 during Dec11 OV)... she notes sister dx w/ breast cancer 2010 & she wants to wean off her hormone Rx, & has discussed this w/ Gyn... she gets regular Mammograms (last 1/14= neg) & [facility]> BMD 12/11 was WNL & she remains on Calcium, MVI, Vit D... ~  Immunizations:  Given TDAP here 2/12... Encouraged to get the yearly Flu vaccine... Given PNEUMOVAX 6/13 (66th birthday)...   Past Surgical History  Procedure Laterality Date  . Nephrectomy  2001     right = for renal cell CA  . Total abdominal hysterectomy      Outpatient Encounter Prescriptions as of 10/14/2014  Medication Sig  . ALPHA LIPOIC ACID PO Take by mouth daily.    Marland Kitchen aspirin 81 MG tablet Take 81 mg by mouth daily.    . Biotin 10 MG TABS Take by mouth daily.    . Coenzyme Q10 (COQ10) 100 MG CAPS Take by mouth daily.    . Evening Primrose Oil 1000 MG CAPS Take by mouth daily.    Marland Kitchen losartan-hydrochlorothiazide (HYZAAR) 100-12.5 MG per tablet Take 1/2 tablet by mouth daily  . Multiple Vitamin (MULTIVITAMIN) capsule Take 1 capsule by mouth daily.    . pravastatin (PRAVACHOL) 40 MG tablet Take 1/2 tablet by mouth daily  . [DISCONTINUED] ciprofloxacin (CIPRO) 500 MG tablet Take 1 tablet (500 mg total) by  mouth 2 (two) times daily. (Patient not taking: Reported on 10/14/2014)  . [DISCONTINUED] ergocalciferol (VITAMIN D2) 50000 UNITS capsule Take 1 capsule (50,000 Units total) by mouth every 14 (fourteen) days. Every other week (Patient not taking: Reported on 10/14/2014)    No Known Allergies   Current Medications, Allergies, Past Medical History, Past Surgical History, Family History, and Social History were reviewed in [employer] record.    Review of Systems        See HPI - all other systems neg except as noted... The patient denies anorexia, fever, weight loss, weight gain, vision loss, decreased hearing, hoarseness, chest pain, syncope, dyspnea on exertion, peripheral edema, prolonged cough, headaches, hemoptysis, abdominal pain, melena, hematochezia, severe indigestion/heartburn, hematuria, incontinence, muscle weakness, suspicious skin lesions, transient blindness, difficulty walking, depression, unusual weight change, abnormal bleeding, enlarged lymph nodes, and angioedema.     Objective:   Physical Exam     WD, Overweight, 68 y/o BF in NAD... GENERAL:  Alert & oriented; pleasant & cooperative... HEENT:  [place]/AT, EOM-wnl, PERRLA, EACs-clear,  TMs-wnl, NOSE-clear, THROAT-clear & wnl. NECK:  Supple w/ fairROM; no JVD; normal carotid impulses w/o bruits; no thyromegaly or nodules palpated; no lymphadenopathy. CHEST:  Clear to P & A; without wheezes/ rales/ or rhonchi. HEART:  Regular Rhythm; without murmurs/ rubs/ or gallops. ABDOMEN:  Soft & nontender; normal bowel sounds; no organomegaly or masses detected. EXT: without deformities, mod arthritic changes w/ bilat knee crepitus on exam;  no varicose veins/ +venous insuffic/ no edema. NEURO:  CN's intact; motor testing normal; sensory testing normal; gait normal & balance OK. DERM:  No lesions noted; no rash etc... flower tatoo over right breast area...  RADIOLOGY DATA:  Reviewed in the EPIC EMR & discussed w/ the patient...  LABORATORY DATA:  Reviewed in the EPIC EMR & discussed w/ the patient...   Assessment & Plan:    HBP>  Controlled on Hyzaar, continue same + diet, exercise, get wt down...  CHOL>  FLP fair on PRAVASTATIN 40mg -1/2 w/ LDL = 118; she is not inclined to incr med to 1 tab daily, therefore continue same + better diet...  GI>  GERD/ IBS>  States her bowels are better on flax seeds in yogurt per [name]...  GU>  s/p right nephrectomy for renal cell ca in 2001; renal funct remains WNL w/ Creat ~1.0 & CXR remains clear...  DJD>  She does elliptical exercises & states this helps her knees...  Other medical issues as noted...   Patient's Medications  New Prescriptions   No medications on file  Previous Medications   ALPHA LIPOIC ACID PO    Take by mouth daily.     ASPIRIN 81 MG TABLET    Take 81 mg by mouth daily.     BIOTIN 10 MG TABS    Take by mouth daily.     COENZYME Q10 (COQ10) 100 MG CAPS    Take by mouth daily.     EVENING PRIMROSE OIL 1000 MG CAPS    Take by mouth daily.     MULTIPLE VITAMIN (MULTIVITAMIN) CAPSULE    Take 1 capsule by mouth daily.    Modified Medications   Modified Medication Previous Medication   LOSARTAN-HYDROCHLOROTHIAZIDE  (HYZAAR) 100-12.5 MG PER TABLET losartan-hydrochlorothiazide (HYZAAR) 100-12.5 MG per tablet      Take 1/2 tablet by mouth daily    Take 1/2 tablet by mouth daily   PRAVASTATIN (PRAVACHOL) 40 MG TABLET pravastatin (PRAVACHOL) 40 MG tablet  Take 1/2 tablet by mouth daily    Take 1/2 tablet by mouth daily  Discontinued Medications   CIPROFLOXACIN (CIPRO) 500 MG TABLET    Take 1 tablet (500 mg total) by mouth 2 (two) times daily.   ERGOCALCIFEROL (VITAMIN D2) 50000 UNITS CAPSULE    Take 1 capsule (50,000 Units total) by mouth every 14 (fourteen) days. Every other week

## 2014-10-14 NOTE — Patient Instructions (Signed)
Today we updated your med list in our EPIC system...    Continue your current medications the same...  Today we did your follow up CXR, EKG & FASTING blood work...    We will contact you w/ the results when available...   We gave you the 2015 flu vaccine...  Keep up the good work w/ DIET & EXERCISE...  Call for any questions...  Let's plan a follow up visit in 6-40mo, sooner if needed for problems.Marland KitchenMarland Kitchen

## 2014-10-27 ENCOUNTER — Other Ambulatory Visit: Payer: Self-pay | Admitting: Nurse Practitioner

## 2014-10-27 DIAGNOSIS — Z1231 Encounter for screening mammogram for malignant neoplasm of breast: Secondary | ICD-10-CM

## 2014-11-25 ENCOUNTER — Ambulatory Visit (HOSPITAL_COMMUNITY)
Admission: RE | Admit: 2014-11-25 | Discharge: 2014-11-25 | Disposition: A | Discharge status: Home / Self Care | Payer: Medicare HMO | Source: Ambulatory Visit | Attending: Nurse Practitioner | Admitting: Nurse Practitioner

## 2014-11-25 DIAGNOSIS — Z1231 Encounter for screening mammogram for malignant neoplasm of breast: Secondary | ICD-10-CM

## 2014-12-02 ENCOUNTER — Encounter: Payer: Self-pay | Admitting: Nurse Practitioner

## 2014-12-02 ENCOUNTER — Ambulatory Visit (INDEPENDENT_AMBULATORY_CARE_PROVIDER_SITE_OTHER): Payer: Medicare HMO | Admitting: Nurse Practitioner

## 2014-12-02 VITALS — BP 120/60 | HR 68 | Resp 20 | Ht 65.0 in | Wt 186.0 lb

## 2014-12-02 DIAGNOSIS — Z0000 Encounter for general adult medical examination without abnormal findings: Secondary | ICD-10-CM

## 2014-12-02 DIAGNOSIS — Z01419 Encounter for gynecological examination (general) (routine) without abnormal findings: Secondary | ICD-10-CM

## 2014-12-02 DIAGNOSIS — Z124 Encounter for screening for malignant neoplasm of cervix: Secondary | ICD-10-CM

## 2014-12-02 DIAGNOSIS — R350 Frequency of micturition: Secondary | ICD-10-CM

## 2014-12-02 LAB — POCT URINALYSIS DIPSTICK
BILIRUBIN UA: NEGATIVE
Blood, UA: NEGATIVE
Glucose, UA: NEGATIVE
Ketones, UA: NEGATIVE
Nitrite, UA: NEGATIVE
Protein, UA: NEGATIVE
Urobilinogen, UA: NEGATIVE
pH, UA: 6

## 2014-12-02 NOTE — Patient Instructions (Addendum)

## 2014-12-02 NOTE — Progress Notes (Signed)
69 y.o. G3P2 Divorced  African American Fe here for annual exam.  No vaso symptoms. Takes care of grand kids who live in [place].  Having increased urinary frequency without dysuria.  Plans to retire at end of year.  Same partner.  Patient's last menstrual period was 10/23/1994.          Sexually active: Yes.    The current method of family planning is status post hysterectomy.    Exercising: Yes.    Walking Smoker:  no  Health Maintenance: Pap: 2001 normal MMG:  11/25/14 BIRADS1:neg Colonoscopy: 08/2013 Normal - Every 10 years  BMD:  2011  TDaP: 11/23/2010 Shingles: not yet Labs: PCP UA: WBC=Trace Ph=6.0   reports that she quit smoking about 31 years ago. Her smoking use included Cigarettes. She has a 2.5 pack-year smoking history. She has never used smokeless tobacco. She reports that she drinks alcohol. She reports that she does not use illicit drugs.  Past Medical History  Diagnosis Date  . Chronic rhinitis   . HTN (hypertension)   . Venous insufficiency   . Hypercholesterolemia   . GERD (gastroesophageal reflux disease)   . IBS (irritable bowel syndrome)   . DJD (degenerative joint disease)   . History of headache   . Anxiety   . Renal cell carcinoma 2001    Past Surgical History  Procedure Laterality Date  . Nephrectomy  2001    right = for renal cell CA  . Total abdominal hysterectomy      Current Outpatient Prescriptions  Medication Sig Dispense Refill  . ALPHA LIPOIC ACID PO Take by mouth daily.      Marland Kitchen aspirin 81 MG tablet Take 81 mg by mouth daily.      . Biotin 10 MG TABS Take by mouth daily.      . Coenzyme Q10 (COQ10) 100 MG CAPS Take by mouth daily.      . Evening Primrose Oil 1000 MG CAPS Take by mouth daily.      Marland Kitchen losartan-hydrochlorothiazide (HYZAAR) 100-12.5 MG per tablet Take 1/2 tablet by mouth daily 45 tablet 6  . Multiple Vitamin (MULTIVITAMIN) capsule Take 1 capsule by mouth daily.      . pravastatin (PRAVACHOL) 40 MG tablet Take 1/2 tablet by  mouth daily 45 tablet 3   No current facility-administered medications for this visit.    Family History  Problem Relation Age of Onset  . Healthy Mother   . Prostate cancer Father   . Colon cancer Neg Hx   . Pancreatic cancer Neg Hx   . Stomach cancer Neg Hx   . Breast cancer Sister   . Cancer - Other Brother     neck ? lymph nodes    ROS:  Pertinent items are noted in HPI.  Otherwise, a comprehensive ROS was negative.  Exam:   BP 120/60 mmHg  Pulse 68  Resp 20  Ht 5\' 5"  (1.651 m)  Wt 186 lb (84.369 kg)  BMI 30.95 kg/m2  LMP 10/23/1994 Height: 5\' 5"  (165.1 cm) Ht Readings from Last 3 Encounters:  12/02/14 5\' 5"  (1.651 m)  10/14/14 5' 5.5" (1.664 m)  04/13/14 5\' 5"  (1.651 m)    General appearance: alert, cooperative and appears stated age Head: Normocephalic, without obvious abnormality, atraumatic Neck: no adenopathy, supple, symmetrical, trachea midline and thyroid normal to inspection and palpation Lungs: clear to auscultation bilaterally Breasts: normal appearance, no masses or tenderness Heart: regular rate and rhythm Abdomen: soft, non-tender; no masses,  no  organomegaly Extremities: extremities normal, atraumatic, no cyanosis or edema Skin: Skin color, texture, turgor normal. No rashes or lesions Lymph nodes: Cervical, supraclavicular, and axillary nodes normal. No abnormal inguinal nodes palpated Neurologic: Grossly normal   Pelvic: External genitalia:  no lesions              Urethra:  normal appearing urethra with no masses, tenderness or lesions              Bartholin's and Skene's: normal                 Vagina: normal appearing vagina with normal color and discharge, no lesions              Cervix: absent              Pap taken: No. Bimanual Exam:  Uterus:  uterus absent              Adnexa: no mass, fullness, tenderness               Rectovaginal: Confirms               Anus:  normal sphincter tone, no lesions  Chaperone present: Yes  A:  Well  Woman with normal exam  S/P TAH secondary to fibroids 1981 on ERT until 10/2008  S/P right Nephrectomy secondary to cancer 08/2000  R/O UTI with recent urinary frequency  P:   Reviewed health and wellness pertinent to exam  Pap smear not taken today  Mammogram is due 11/2015  Counseled on breast self exam, mammography screening, adequate intake of calcium and vitamin D, diet and exercise return annually or prn  An After Visit Summary was printed and given to the patient.

## 2014-12-04 LAB — URINE CULTURE
COLONY COUNT: NO GROWTH
Organism ID, Bacteria: NO GROWTH

## 2014-12-04 NOTE — Progress Notes (Signed)
Reviewed personally.  M. Suzanne Erique Kaser, MD.  

## 2015-04-19 ENCOUNTER — Encounter: Payer: Self-pay | Admitting: Pulmonary Disease

## 2015-04-19 ENCOUNTER — Other Ambulatory Visit: Payer: Self-pay

## 2015-04-19 ENCOUNTER — Ambulatory Visit (INDEPENDENT_AMBULATORY_CARE_PROVIDER_SITE_OTHER): Payer: Medicare HMO | Admitting: Pulmonary Disease

## 2015-04-19 VITALS — BP 122/78 | HR 63 | Temp 98.4°F | Wt 192.0 lb

## 2015-04-19 DIAGNOSIS — K219 Gastro-esophageal reflux disease without esophagitis: Secondary | ICD-10-CM

## 2015-04-19 DIAGNOSIS — I872 Venous insufficiency (chronic) (peripheral): Secondary | ICD-10-CM | POA: Diagnosis not present

## 2015-04-19 DIAGNOSIS — M7730 Calcaneal spur, unspecified foot: Secondary | ICD-10-CM

## 2015-04-19 DIAGNOSIS — E7800 Pure hypercholesterolemia, unspecified: Secondary | ICD-10-CM

## 2015-04-19 DIAGNOSIS — M159 Polyosteoarthritis, unspecified: Secondary | ICD-10-CM

## 2015-04-19 DIAGNOSIS — I10 Essential (primary) hypertension: Secondary | ICD-10-CM | POA: Diagnosis not present

## 2015-04-19 DIAGNOSIS — F411 Generalized anxiety disorder: Secondary | ICD-10-CM

## 2015-04-19 DIAGNOSIS — M150 Primary generalized (osteo)arthritis: Secondary | ICD-10-CM

## 2015-04-19 DIAGNOSIS — C641 Malignant neoplasm of right kidney, except renal pelvis: Secondary | ICD-10-CM

## 2015-04-19 DIAGNOSIS — K589 Irritable bowel syndrome without diarrhea: Secondary | ICD-10-CM

## 2015-04-19 MED ORDER — LOSARTAN POTASSIUM-HCTZ 100-12.5 MG PO TABS: ORAL_TABLET | ORAL | Status: DC

## 2015-04-19 MED ORDER — PRAVASTATIN SODIUM 40 MG PO TABS: ORAL_TABLET | ORAL | Status: DC

## 2015-04-19 NOTE — Progress Notes (Signed)
Subjective:    Patient ID: Heidi [name], female    DOB: 21-Dec-1945, 69 y.o.   MRN: [id number]  HPI 69 y/o BF here for a follow up visit... she has mult med problems as noted below...  SEE PREV EPIC NOTES FOR EARLIER DATA >>   CXR 12/13 showed normal heart size, clear lungs, NAD.Marland Kitchen  EKG 12/13 showed SBrady, rate54, WNL, NAD...  LABS 12/13:  FLP- ok on Prav40 w/ LDL=106;  Chems- wnl;  CBC- wnl;  TSH=1.20;  UA=ok...  ~  April 09, 2013:  77mo ROV & Heidi [name] is doing well & she had a good interval w/o new complaints or concerns; recently treated by Podiatry for left achilles tendonitis w/ 2 shots and pred, rec for PT but she's doing her own exercises; note that she's gained 6# & asked to consider bike or other non-weight-bearing exercise + diet etc... We reviewed the following medical problems during today's office visit >>     HBP> on ASA 81, Losartan/Hct 100/12.5-1/2 tab;  BP= 124/82 & she denies CP, palpit, SOB, dizzy, edema, etc...    Ven Insuffic> reminded to elim salt, elev legs, wear support hose, etc...    CHOL> on Prav40-1/2tab & tol well; FLP 12/13 showed TChol 169, TG 71, HDL 49, LDL 106    Overweight> wt is up 6# to 191#, BMI= 31-2, but she is excited about new juicer 7 she is doing this for 71meal/d...    GI> GERD, IBS> she takes Flax seed in [place]; states bowels much better- denies n/v, c/d, blood seen; she also take a number of supplements per [name]...    Hx Renal cell ca> s/p right nephrectomy 2001; no known recurrence; renal function remains normal w/ labs 12/13 showing BUN=15, Creat=1.1    DJD> using OTC meds and Tylenol; rec to continue exercise program & home therapy; developed achilles tendonitis 5/14 w/ shots & Pred per Podiatry.    Vit D defic> on VitD50K every other week per Gyn... We reviewed prob list, meds, xrays and labs> see below for updates >>   ~  October 13, 2013:  50mo ROV & Heidi [name] continues to do well- no new complaints or concerns... She takes nutritional  shakes daily...    BP controlled on Hyzaar 100-12.5 daily & BP= 118/68 today w/o HAs, visualsymptoms, CP, palpit, SOB, edema, etc...    Chol is treated w/ diet & Prav40-1/2 daily; FLP not quite at goals w/ LDL=126 but she does not want to incr the dose; we reviewed diet & exercise...    Her weight is 187# w/ BMI~31; we reviewed wt reducing diet strategies...    Hx renal cell ca w/ right nephrectomy 2001, no known recurrence...     She takes a number of supplements... We reviewed prob list, meds, xrays and labs> see below for updates >> she had the 2014 Flu vaccine in Nov... meds refilled per request... LABS 12/14:  FLP- ok x LDL=126 on Prav20;  Chems- wnl;  CBC- wnl;  TSH=1.91;  VitD=56...   ~  April 13, 2014:  81mo ROV & Heidi [name] reports that she is doing great on a new diet> Dr.IanKSmith's "super shredded diet" + exercise program (walking, tapes, & yoga); she actually lost 20# but recently gained back 10 to 178# today & she wants to get into the 160's...     HBP> on ASA 81, Losartan/Hct 100/12.5-1/2 tab;  BP= 128/74 & she denies CP, palpit, SOB, dizzy, edema, etc...    Ven Insuffic> there  is no edema & she is reminded to elim salt, elev legs, wear support hose, etc...    CHOL> on Prav40-1/2tab & tol well; FLP 12/14 showed TChol 189, TG 60, HDL 51, LDL 126... She knows to elim fat & keep wt down.    Overweight> wt is down 10# to 178#, BMI= 30, but she is excited about her new diet program called the "super shredded diet"...    GI> GERD, IBS> she takes Flax seed in [place]; has lactose intol but notes that peppermint tea helps- denies n/v, c/d, blood seen; last colon was 11/14 by [name] & neg; she also take a number of supplements per [name]...    Hx Renal cell ca> s/p right nephrectomy 2001; no known recurrence; renal function remains normal w/ labs 12/14 showing BUN=15, Creat=1.0    DJD> using OTC meds and Tylenol; rec to continue exercise program & home therapy; developed achilles tendonitis 5/14  w/ shots & Pred per Podiatry.    Vit D defic> on VitD OTC supplement w/ VitD level 2/15 per GYN= 49 We reviewed prob list, meds, xrays and labs> see below for updates >>   ~  October 14, 2014:  58mo ROV & Heidi [name] reports doing satis just occas bladder infections and she will have this checked by Gyn soon (s/p Hyst yrs ago)... We reviewed the following medical problems during today's office visit >>     HBP> on ASA 81, Losartan/Hct 100/12.5-1/2 tab;  BP= 118/76 & she denies CP, palpit, SOB, dizzy, edema, etc...    Ven Insuffic> there is no edema & she is reminded to elim salt, elev legs, wear support hose, etc...    CHOL> on Prav40-1/2tab & tol well; FLP 12/15 showed TChol 176, TG 75, HDL 43, LDL 118... She knows to elim fat & keep wt down.    Overweight> wt is down 7# to 177#, BMI= 30, on her diet program called the "super shredded diet"...    GI> GERD, IBS> she takes Flax seed in [place]; has lactose intol but notes that peppermint tea helps- denies n/v, c/d, blood seen; last colon was 11/14 by [name] & neg; she also take a number of supplements per [name]...    Hx Renal cell ca> s/p right nephrectomy 2001; no known recurrence; renal function remains normal w/ labs 12/15 showing BUN=15, Creat=1.0 (stable).    DJD> using OTC meds and Tylenol; rec to continue exercise program & home therapy; developed achilles tendonitis 5/14 w/ shots & Pred per Podiatry.    Vit D defic> on VitD OTC supplement w/ VitD level 12/15 = 40 We reviewed prob list, meds, xrays and labs> see below for updates >>  OK 2015 Flu vaccine today...  CXR 12/15 showed norm heart size, clear lungs, NAD.Marland KitchenMarland Kitchen   EKG 12/15 showed sinus brady, rate52, otherw wnl, NAD...  LABS 12/15:  FLP- ok x LDL=118;  Chems- wnl;  CBC- wnl;  TSH=1.80;  VitD=40...  ~  April 19, 2015:  58mo ROV & Heidi [name] is c/o bone spurs in her heels per Podiatry- she has seen DrZiegler, DrTom, & a lady podiatrist who wants to do surg; is is very concerned & not getting  better w/ their therapy=> I suggest Orthopedic foot specialist consult- DrHewitt etal... She is also c/o ankle edema and her weight is up 8# to 192# today, she increased her Hyzaar100-12.5 from 1/2 tab to 1tab daily, & we discussed the need for NO SALT, elevation, support hose.Marland KitchenMarland Kitchen  HBP> on ASA 81, Losartan/Hct 100/12.5 daily;  BP= 122/78 & she denies CP, palpit, SOB, dizzy, edema, etc...    Ven Insuffic, edema> she is reminded to elim salt, elev legs, wear support hose, etc...    CHOL> on Prav40-1/2tab & tol well; FLP 12/15 showed TChol 176, TG 75, HDL 43, LDL 118... She knows to elim fat & get wt down.    Overweight> wt is up 8# to 192#, BMI= 32, ?on her diet program called the "super shredded diet"; we reviewed eating less, burning more.    GI> GERD, IBS> she takes Flax seed in [place]; has lactose intol but notes that peppermint tea helps- denies n/v, c/d, blood seen; last colon was 11/14 by [name] & neg; she also take a number of supplements per [name]...    Hx Renal cell ca> s/p right nephrectomy 2001; no known recurrence; renal function remains normal w/ labs 12/15 showing BUN=15, Creat=1.0 (stable).    DJD> using OTC meds and Tylenol; developed achilles tendonitis 5/14 w/ shots & Pred per Podiatry; now heel spurs & we will refer to Ortho....    Vit D defic> on VitD OTC supplement w/ VitD level 12/15 = 40 We reviewed prob list, meds, xrays and labs> see below for updates >>  PLAN>>  We discussed referral to Ortho foot specialist for their opinion regarding her heel spurs; asked to continue the Hyzaar one daily & elim salt, elev legs, wear support hose, etc; we discussed her stress at work but she doesn't want anxiolytic rx (she plans to retire at end of yr); we reviewed diet + exercise...           Problem List:   HYPERTENSION (ICD-401.9) - controlled on ASA 81mg /d, & LOSARTAN/ HCT 100-12.5 taking 1/2 tab daily...  ~  6/12: BP=128/78 and tol Rx well... she denies HA, fatigue, visual  changes, CP, palipit, dizziness, syncope, dyspnea, edema, etc... ~  12/12: BP= 124/72 & doing well w/o symptoms... ~  6/13:  BP= 118/68 & she denies CP, palpit, SOB, dizzy, edema, etc... ~  12/13: on ASA 81, Losartan/Hct 100/12.5-1/2;  BP= 118/74 & she denies CP, palpit, SOB, dizzy, edema, etc...  ~  CXR 12/13 showed norm heart size, clear lungs, wnl/ NAD.Marland KitchenMarland Kitchen EKG 12/13 showed SBrady, rate54, wnl/ NAD... ~  6/14: on ASA 81, Losartan/Hct 100/12.5-1/2 tab;  BP= 124/82 & she denies CP, palpit, SOB, dizzy, edema, etc ~  12/14: BP controlled on Hyzaar 100-12.5 daily & BP= 118/68 today w/o HAs, visualsymptoms, CP, palpit, SOB, edema, etc. ~  6/15: BP= 128/74 on Hyzaar 100-12.5 taking 1/2 tab daily... ~  12/15: on ASA 81, Losartan/Hct 100/12.5-1/2 tab;  BP= 118/76 & she denies CP, palpit, SOB, dizzy, edema, etc ~  6/16: on ASA 81, Losartan/Hct 100/12.5 daily;  BP= 122/78 & she remains asymptomatic...  VENOUS INSUFFICIENCY (ICD-459.81) - low salt diet, support hose when nec... she reports hammer toe per podiatry but she doesn't want surg... ~  6/16: she is c/o incr edema in her ankles, she incr her hyzaar from 1/2 to 1 tab daily, rec to elim salt, elevate, wear support hose...  HYPERCHOLESTEROLEMIA (ICD-272.0) - We discussed diet +exercise to get weight down... ~  [place] 3/09 showed TChol 198, TG 80, HDL 59, LDL 123... not at goal- start Simvast20/d. ~  FLP 9/09 showed TChol 139, TG 74, HDL 47, LDL 77... doing well on Simva20, but "intol" per pt, refer to [facility]. ~  [place] 3/10 on Crestor2.5 showed TChol  151, TG 78, HDL 54, LDL 82... continue same & [facility]. ~  FLP 9/10 showed TChol 159, TG 64, HDL 54, LDL 93 ~  FLP 12/10 showed TChol 164, TG 74, HDL 51, LDL 98 ~  FLP 3/11 showed TChol 163, TG 73, HDL 56, LDL 92 ~  FLP 12/11 on Cres2.5 showed TChol 172, TG 69, HDL 50, LDL 108... We reviewed diet & exercise... ~  FLP 6/12 on Cres2.5 showed TChol 165, TG 75, HDL 48, LDL 102... she requests change meds due to  cost> try PRAV40 (she decided on 1/2) ~  [place] 12/12 on Cres2.5 showed TChol 173, TG 85, HDL 50, LDL 106... Hasn't switched to the PRAV40 yet! ~  [place] 6/13 on Prav40-1/2 showed TChol 189, TG 56, HDL 53, LDL 125... rec to take Qhs, get on diet, get wt down... ~  [place] 12/13 on Prav40-1/2 showed TChol 169, TG 71, HDL 49, LDL 106  ~  FLP 12/14 on Prav40-1/2 showed TChol 189, TG 60, HDL 51, LDL 126... She does not want to incr the Prav, discussed diet & wt reduction. ~  FLP 12/15 on Prav40-1/2 showed TChol 176, TG 75, HDL 43, LDL 118  GERD (ICD-530.81) IRRITABLE BOWEL SYNDROME (ICD-564.1) - followed by [name] and notes reviewed... he rec increased fiber, Benefiber, Robinul as needed... she prefers therapy from an herbalist and taking "daily cleanse pill" which helped; now she takes 2tablesp flax seeds in yogurt daily & improved... ~  prev colonoscopy 9/01 by [name] was WNL.Marland Kitchen.  ~  f/u colonoscopy 11/09 by [name] was neg- WNL, f/u planned 69yrs... ~  F/u colonoscopy 11/14 by [name] was neg- norm colon & f/u exam suggested in 46yrs...   Hx of CARCINOMA, RENAL CELL (ICD-189.0) - s/p right nephrectomy 2001 at [facility] by [facility]... he signed off in 2006 and exam/ CXR's have been neg-  ~  f/u CXR 9/09 was clear, WNL... ~  12/10:  we discussed CXR (neg- WNL) & full labs (OK)& she will return for Fasting blood work... ~  12/11:  CXR remains neg- clear, WNL... Renal function normal w/ BUN=14, Creat=1.0 ~  12/12:  CXR remains clear, no lesions & labs similarly normal... Renal function normal w/ BUN=16, Creat=1.1 ~  12/13:  CXR showed norm heart size, clear lungs, NAD;  Renal function remains norm w/ BUN= 15, Cr= 1.1 ~  12/14:  Labs remain stable with BUN= 15, Cr= 1.0 ~  12/15:  CXR showed norm heart size, clear lungs, NAD;  Renal function remains normal w/ BUN= 15, Cr= 1.0  DEGENERATIVE JOINT DISEASE (ICD-715.90) - she's improved w/ exercise program... but c/o bilat R>L knee discomfort w/ eval by  Ortho (?who) w/ XRays and shot... doesn't want Rx- using OTC anti-inflamm + Tylenol... She notes that elliptical exercise helps her knees... ~  [facility]- all wnl... ~  Vit D level ~50 on supplements OTC per Gyn... ~  Labs here 12/15 showed Vit D = 40 ~  Pt is c/o foot pain, Podiatry eval w/ heel spus & they rec surg- we will refer to Ortho foot specialist for their eval...  Hx of HEADACHE (ICD-784.0)  ANXIETY (ICD-300.00) - she does not wish to have her Alpraz refilled...  Health Maintenance - GYN= [address] and she does PAPs and checked Vit D level on pt (pt reports taking 50000 u Vit D every other day now w/ her Vit D level = 55 during Dec11 OV)... she notes sister  dx w/ breast cancer 2010 & she wants to wean off her hormone Rx, & has discussed this w/ Gyn... she gets regular Mammograms (last 1/14= neg) & [facility]> BMD 12/11 was WNL & she remains on Calcium, MVI, Vit D... ~  Immunizations:  Given TDAP here 2/12... Encouraged to get the yearly Flu vaccine... Given PNEUMOVAX 6/13 (66th birthday)...   Past Surgical History  Procedure Laterality Date  . Nephrectomy  2001    right = for renal cell CA  . Total abdominal hysterectomy      Outpatient Encounter Prescriptions as of 04/19/2015  Medication Sig  . ALPHA LIPOIC ACID PO Take by mouth daily.    Marland Kitchen aspirin 81 MG tablet Take 81 mg by mouth daily.    . Biotin 10 MG TABS Take by mouth daily.    . Coenzyme Q10 (COQ10) 100 MG CAPS Take by mouth daily.    . Evening Primrose Oil 1000 MG CAPS Take by mouth daily.    Marland Kitchen losartan-hydrochlorothiazide (HYZAAR) 100-12.5 MG per tablet Take 1/2 tablet by mouth daily (Patient taking differently: 1 tablet. Take 1/2 tablet by mouth daily)  . Multiple Vitamin (MULTIVITAMIN) capsule Take 1 capsule by mouth daily.    . pravastatin (PRAVACHOL) 40 MG tablet Take 1/2 tablet by mouth daily   No facility-administered encounter medications on file as of 04/19/2015.     No Known Allergies   Current Medications, Allergies, Past Medical History, Past Surgical History, Family History, and Social History were reviewed in [employer] record.    Review of Systems        See HPI - all other systems neg except as noted... She is c/o foot pain and some ankle edema. The patient denies anorexia, fever, weight loss, weight gain, vision loss, decreased hearing, hoarseness, chest pain, syncope, dyspnea on exertion, prolonged cough, headaches, hemoptysis, abdominal pain, melena, hematochezia, severe indigestion/heartburn, hematuria, incontinence, muscle weakness, suspicious skin lesions, transient blindness, difficulty walking, depression, unusual weight change, abnormal bleeding, enlarged lymph nodes, and angioedema.     Objective:   Physical Exam     WD, Overweight, 69 y/o BF in NAD... GENERAL:  Alert & oriented; pleasant & cooperative... HEENT:  [place]/AT, EOM-wnl, PERRLA, EACs-clear, TMs-wnl, NOSE-clear, THROAT-clear & wnl. NECK:  Supple w/ fairROM; no JVD; normal carotid impulses w/o bruits; no thyromegaly or nodules palpated; no lymphadenopathy. CHEST:  Clear to P & A; without wheezes/ rales/ or rhonchi. HEART:  Regular Rhythm; without murmurs/ rubs/ or gallops. ABDOMEN:  Soft & nontender; normal bowel sounds; no organomegaly or masses detected. EXT: without deformities, mod arthritic changes w/ bilat knee crepitus on exam;  no varicose veins/ +venous insuffic/ tr edema. NEURO:  CN's intact; motor testing normal; sensory testing normal; gait normal & balance OK. DERM:  No lesions noted; no rash etc... flower tatoo over right breast area...  RADIOLOGY DATA:  Reviewed in the EPIC EMR & discussed w/ the patient...  LABORATORY DATA:  Reviewed in the EPIC EMR & discussed w/ the patient...   Assessment & Plan:    HBP>  Controlled on Hyzaar, continue same + diet, exercise, get wt down...  VI, edema>  Needs better low salt diet, continue  Hyzaar daily...  CHOL>  FLP fair on PRAVASTATIN 40mg -1/2 w/ LDL = 118; she is not inclined to incr med to 1 tab daily, therefore continue same + better diet...  GI>  GERD/ IBS>  States her bowels are better on flax seeds in yogurt per [name].Marland KitchenMarland Kitchen  GU>  s/p right nephrectomy for renal cell ca in 2001; renal funct remains WNL w/ Creat ~1.0 & CXR remains clear...  DJD>  She does elliptical exercises & states this helps her knees...  Other medical issues as noted...   Patient's Medications  New Prescriptions   No medications on file  Previous Medications   ALPHA LIPOIC ACID PO    Take by mouth daily.     ASPIRIN 81 MG TABLET    Take 81 mg by mouth daily.     BIOTIN 10 MG TABS    Take by mouth daily.     COENZYME Q10 (COQ10) 100 MG CAPS    Take by mouth daily.     EVENING PRIMROSE OIL 1000 MG CAPS    Take by mouth daily.     MULTIPLE VITAMIN (MULTIVITAMIN) CAPSULE    Take 1 capsule by mouth daily.    Modified Medications   Modified Medication Previous Medication   LOSARTAN-HYDROCHLOROTHIAZIDE (HYZAAR) 100-12.5 MG PER TABLET losartan-hydrochlorothiazide (HYZAAR) 100-12.5 MG per tablet      Take one tablet by mouth daily    Take 1/2 tablet by mouth daily   PRAVASTATIN (PRAVACHOL) 40 MG TABLET pravastatin (PRAVACHOL) 40 MG tablet      Take 1/2 tablet by mouth daily    Take 1/2 tablet by mouth daily  Discontinued Medications   No medications on file

## 2015-04-19 NOTE — Patient Instructions (Signed)
Today we updated your med list in our EPIC system...    Continue your current medications the same...  We will arrange for a second opinion consult regarding your heel spurs w/ an Orthopedic physician who specializes in foot problems...  Congrats on your planned retirement this year!!!  Call for any questions...  Let's plan a follow up visit in 43mo, sooner if needed for problems.Marland KitchenMarland Kitchen

## 2015-10-19 ENCOUNTER — Ambulatory Visit: Payer: Medicare HMO | Admitting: Pulmonary Disease

## 2015-10-27 ENCOUNTER — Ambulatory Visit: Payer: Medicare HMO | Admitting: Pulmonary Disease

## 2015-11-04 ENCOUNTER — Other Ambulatory Visit: Payer: Self-pay

## 2015-11-04 DIAGNOSIS — Z1231 Encounter for screening mammogram for malignant neoplasm of breast: Secondary | ICD-10-CM

## 2015-11-23 ENCOUNTER — Other Ambulatory Visit (INDEPENDENT_AMBULATORY_CARE_PROVIDER_SITE_OTHER): Payer: Medicare HMO

## 2015-11-23 ENCOUNTER — Encounter: Payer: Self-pay | Admitting: Pulmonary Disease

## 2015-11-23 ENCOUNTER — Ambulatory Visit (INDEPENDENT_AMBULATORY_CARE_PROVIDER_SITE_OTHER): Payer: Medicare HMO | Admitting: Pulmonary Disease

## 2015-11-23 VITALS — BP 112/70 | HR 60 | Temp 97.6°F | Ht 65.0 in | Wt 192.0 lb

## 2015-11-23 DIAGNOSIS — F411 Generalized anxiety disorder: Secondary | ICD-10-CM

## 2015-11-23 DIAGNOSIS — Z23 Encounter for immunization: Secondary | ICD-10-CM

## 2015-11-23 DIAGNOSIS — E7800 Pure hypercholesterolemia, unspecified: Secondary | ICD-10-CM

## 2015-11-23 DIAGNOSIS — I10 Essential (primary) hypertension: Secondary | ICD-10-CM | POA: Diagnosis not present

## 2015-11-23 DIAGNOSIS — I872 Venous insufficiency (chronic) (peripheral): Secondary | ICD-10-CM

## 2015-11-23 DIAGNOSIS — M159 Polyosteoarthritis, unspecified: Secondary | ICD-10-CM

## 2015-11-23 DIAGNOSIS — K219 Gastro-esophageal reflux disease without esophagitis: Secondary | ICD-10-CM

## 2015-11-23 DIAGNOSIS — M150 Primary generalized (osteo)arthritis: Secondary | ICD-10-CM

## 2015-11-23 DIAGNOSIS — K589 Irritable bowel syndrome without diarrhea: Secondary | ICD-10-CM

## 2015-11-23 DIAGNOSIS — R69 Illness, unspecified: Secondary | ICD-10-CM | POA: Diagnosis not present

## 2015-11-23 DIAGNOSIS — C641 Malignant neoplasm of right kidney, except renal pelvis: Secondary | ICD-10-CM

## 2015-11-23 LAB — HEPATIC FUNCTION PANEL
ALBUMIN: 4.2 g/dL (ref 3.5–5.2)
ALT: 12 U/L (ref 0–35)
AST: 15 U/L (ref 0–37)
Alkaline Phosphatase: 63 U/L (ref 39–117)
Bilirubin, Direct: 0.1 mg/dL (ref 0.0–0.3)
Total Bilirubin: 0.6 mg/dL (ref 0.2–1.2)
Total Protein: 7.5 g/dL (ref 6.0–8.3)

## 2015-11-23 LAB — BASIC METABOLIC PANEL
BUN: 13 mg/dL (ref 6–23)
CALCIUM: 9.8 mg/dL (ref 8.4–10.5)
CO2: 30 meq/L (ref 19–32)
CREATININE: 0.97 mg/dL (ref 0.40–1.20)
Chloride: 108 mEq/L (ref 96–112)
GFR: 73.32 mL/min (ref >60.00)
GLUCOSE: 87 mg/dL (ref 70–99)
Potassium: 4.1 mEq/L (ref 3.5–5.1)
Sodium: 144 mEq/L (ref 135–145)

## 2015-11-23 LAB — CBC WITH DIFFERENTIAL/PLATELET
BASOS ABS: 0 10*3/uL (ref 0.0–0.1)
Basophils Relative: 0.5 % (ref 0.0–3.0)
Eosinophils Absolute: 0.1 10*3/uL (ref 0.0–0.7)
Eosinophils Relative: 1.6 % (ref 0.0–5.0)
HEMATOCRIT: 39.5 % (ref 36.0–46.0)
Hemoglobin: 12.8 g/dL (ref 12.0–15.0)
LYMPHS ABS: 2.4 10*3/uL (ref 0.7–4.0)
LYMPHS PCT: 37.4 % (ref 12.0–46.0)
MCHC: 32.5 g/dL (ref 30.0–36.0)
MCV: 88.2 fl (ref 78.0–100.0)
MONOS PCT: 8.4 % (ref 3.0–12.0)
Monocytes Absolute: 0.5 10*3/uL (ref 0.1–1.0)
NEUTROS PCT: 52.1 % (ref 43.0–77.0)
Neutro Abs: 3.3 10*3/uL (ref 1.4–7.7)
Platelets: 334 10*3/uL (ref 150.0–400.0)
RBC: 4.48 Mil/uL (ref 3.87–5.11)
RDW: 15.8 % — ABNORMAL HIGH (ref 11.5–15.5)
WBC: 6.3 10*3/uL (ref 4.0–10.5)

## 2015-11-23 LAB — LIPID PANEL
CHOLESTEROL: 143 mg/dL (ref 0–200)
HDL: 45.3 mg/dL (ref >39.00)
LDL Cholesterol: 83 mg/dL (ref 0–99)
NonHDL: 97.87
Total CHOL/HDL Ratio: 3
Triglycerides: 76 mg/dL (ref 0.0–149.0)
VLDL: 15.2 mg/dL (ref 0.0–40.0)

## 2015-11-23 LAB — VITAMIN D 25 HYDROXY (VIT D DEFICIENCY, FRACTURES): VITD: 31.8 ng/mL (ref 30.00–100.00)

## 2015-11-23 LAB — TSH: TSH: 1.74 u[IU]/mL (ref 0.35–4.50)

## 2015-11-23 NOTE — Patient Instructions (Signed)
Today we updated your med list in our EPIC system...    Continue your current medications the same...  Today we checked your follow up FASTING blood work...    We will contact you w/ the results when available...   For your constipation>     Try the OTC SENAKOT-S and/or MIRALAX...  We gave you the 2016 Flu vaccine today...  Call for any questions...  Let's plan a follow up visit in 68mo, sooner if needed for problems...  CONGRATS on your retirement!!!

## 2015-11-23 NOTE — Progress Notes (Signed)
Subjective:    Patient ID: Heidi [name], female    DOB: 01-10-1946, 70 y.o.   MRN: [id number]  HPI 70 y/o BF here for a follow up visit... she has mult med problems as noted below...   SEE PREV EPIC NOTES FOR EARLIER DATA >>   CXR 12/13 showed normal heart size, clear lungs, NAD.Marland Kitchen  EKG 12/13 showed SBrady, rate54, WNL, NAD...  LABS 12/13:  FLP- ok on Prav40 w/ LDL=106;  Chems- wnl;  CBC- wnl;  TSH=1.20;  UA=ok...  LABS 12/14:  FLP- ok x LDL=126 on Prav20;  Chems- wnl;  CBC- wnl;  TSH=1.91;  VitD=56...   ~  October 14, 2014:  26mo ROV & Benetta reports doing satis just occas bladder infections and she will have this checked by Gyn soon (s/p Hyst yrs ago)... We reviewed the following medical problems during today's office visit >>     HBP> on ASA 81, Losartan/Hct 100/12.5-1/2 tab;  BP= 118/76 & she denies CP, palpit, SOB, dizzy, edema, etc...    Ven Insuffic> there is no edema & she is reminded to elim salt, elev legs, wear support hose, etc...    CHOL> on Prav40-1/2tab & tol well; FLP 12/15 showed TChol 176, TG 75, HDL 43, LDL 118... She knows to elim fat & keep wt down.    Overweight> wt is down 7# to 177#, BMI= 30, on her diet program called the "super shredded diet"...    GI> GERD, IBS> she takes Flax seed in [place]; has lactose intol but notes that peppermint tea helps- denies n/v, c/d, blood seen; last colon was 11/14 by [name] & neg; she also take a number of supplements per [name]...    Hx Renal cell ca> s/p right nephrectomy 2001; no known recurrence; renal function remains normal w/ labs 12/15 showing BUN=15, Creat=1.0 (stable).    DJD> using OTC meds and Tylenol; rec to continue exercise program & home therapy; developed achilles tendonitis 5/14 w/ shots & Pred per Podiatry.    Vit D defic> on VitD OTC supplement w/ VitD level 12/15 = 40 We reviewed prob list, meds, xrays and labs> see below for updates >>  OK 2015 Flu vaccine today...  CXR 12/15 showed norm heart size,  clear lungs, NAD.Marland KitchenMarland Kitchen   EKG 12/15 showed sinus brady, rate52, otherw wnl, NAD...  LABS 12/15:  FLP- ok x LDL=118;  Chems- wnl;  CBC- wnl;  TSH=1.80;  VitD=40...  ~  April 19, 2015:  17mo ROV & [name] is c/o bone spurs in her heels per Podiatry- she has seen DrZiegler, DrTom, & a lady podiatrist who wants to do surg; is is very concerned & not getting better w/ their therapy=> I suggest Orthopedic foot specialist consult- DrHewitt etal... She is also c/o ankle edema and her weight is up 8# to 192# today, she increased her Hyzaar100-12.5 from 1/2 tab to 1tab daily, & we discussed the need for NO SALT, elevation, support hose...     HBP> on ASA 81, Losartan/Hct 100/12.5 daily;  BP= 122/78 & she denies CP, palpit, SOB, dizzy, edema, etc...    Ven Insuffic, edema> she is reminded to elim salt, elev legs, wear support hose, etc...    CHOL> on Prav40-1/2tab & tol well; FLP 12/15 showed TChol 176, TG 75, HDL 43, LDL 118... She knows to elim fat & get wt down.    Overweight> wt is up 8# to 192#, BMI= 32, ?on her diet program called the "super shredded diet"; we reviewed  eating less, burning more.    GI> GERD, IBS> she takes Flax seed in [place]; has lactose intol but notes that peppermint tea helps- denies n/v, c/d, blood seen; last colon was 11/14 by [name] & neg; she also take a number of supplements per [name]...    Hx Renal cell ca> s/p right nephrectomy 2001; no known recurrence; renal function remains normal w/ labs 12/15 showing BUN=15, Creat=1.0 (stable).    DJD> using OTC meds and Tylenol; developed achilles tendonitis 5/14 w/ shots & Pred per Podiatry; now heel spurs & we will refer to Ortho....    Vit D defic> on VitD OTC supplement w/ VitD level 12/15 = 40 We reviewed prob list, meds, xrays and labs> see below for updates >>  PLAN>>  We discussed referral to Ortho foot specialist for their opinion regarding her heel spurs; asked to continue the Hyzaar one daily & elim salt, elev legs, wear  support hose, etc; we discussed her stress at work but she doesn't want anxiolytic rx (she plans to retire at end of yr); we reviewed diet + exercise...   ~  November 23, 2015:  70mo ROV & Heidi [name] tells me that she has retired "it feels like I am on vacation" (prev employ by [employer], [employer], [employer]);  She is doing satis w/o CP, palpit, dizzy/syncope, edema, etc; she denies cough, sput, SOB, f/c/s; she exercises daily at home w/o difficulty & joining silver sneakers;  She has noted some recent constipation- no abd pain, n/v, blood seen & her last colon was 11/14 by [name] (neg- no lesions seen)...     HBP> on ASA 81, Losartan/Hct 100/12.5 daily;  BP= 112/70 & she denies CP, palpit, SOB, dizzy, edema, etc...    Ven Insuffic, edema> she is reminded to elim salt, elev legs, wear support hose, etc...    CHOL> on Prav40-1/2tab & tol well; FLP 1/17 showed TChol 143, TG 76, HDL 45, LDL 83... She knows to elim fat & get wt down.    Overweight> wt is stable at 192#, BMI= 32, ?on her diet program called the "super shredded diet"; we reviewed eating less, burning more.    GI> GERD, IBS> she takes Flax seed in [place]; has lactose intol but notes that peppermint tea helps- denies n/v, c/d, blood seen; last colon was 11/14 by [name] & neg; she also take a number of supplements per [name]...    Hx Renal cell ca> s/p right nephrectomy 2001; no known recurrence; renal function remains normal w/ labs 1/17 showing BUN=13, Creat=0/97 (stable).    DJD> using OTC meds and Tylenol; developed achilles tendonitis 5/14 w/ shots & Pred per Podiatry; now heel spurs & we will refer to Ortho....    Vit D defic> on VitD OTC supplement w/ VitD level 12/17 = 32; reminded to take the OTC VitD supplement every day ~2000u... EXAM shows Afeb, VSS, O2sat=98% on RA;  Wt=192#;  HEENT- neg mallampati1;  Chest- clear w/o w/r/r;  Heart- RR w/o m/r/g;  Abd- soft, nondistended, norm BS w/o masses etc;  Ext- neg, w/o c/c/e...   LABS 11/23/15>  FLP- at  goals on Prav20;  Chems- wnl;  CBC- wnl;  TSH=1.74;  VitD=32...  IMP/PLAN>>  [name] remains stable, doing satis;  Reminded about diet/ exercise/ wt reduction; continue same meds and supplements...          Problem List:   HYPERTENSION (ICD-401.9) - controlled on ASA 81mg /d, & LOSARTAN/ HCT 100-12.5 taking 1/2 tab daily...  ~  6/12: BP=128/78 and tol Rx well... she denies HA, fatigue, visual changes, CP, palipit, dizziness, syncope, dyspnea, edema, etc... ~  12/12: BP= 124/72 & doing well w/o symptoms... ~  6/13:  BP= 118/68 & she denies CP, palpit, SOB, dizzy, edema, etc... ~  12/13: on ASA 81, Losartan/Hct 100/12.5-1/2;  BP= 118/74 & she denies CP, palpit, SOB, dizzy, edema, etc...  ~  CXR 12/13 showed norm heart size, clear lungs, wnl/ NAD.Marland KitchenMarland Kitchen EKG 12/13 showed SBrady, rate54, wnl/ NAD... ~  6/14: on ASA 81, Losartan/Hct 100/12.5-1/2 tab;  BP= 124/82 & she denies CP, palpit, SOB, dizzy, edema, etc ~  12/14: BP controlled on Hyzaar 100-12.5 daily & BP= 118/68 today w/o HAs, visualsymptoms, CP, palpit, SOB, edema, etc. ~  6/15: BP= 128/74 on Hyzaar 100-12.5 taking 1/2 tab daily... ~  12/15: on ASA 81, Losartan/Hct 100/12.5-1/2 tab;  BP= 118/76 & she denies CP, palpit, SOB, dizzy, edema, etc ~  6/16: on ASA 81, Losartan/Hct 100/12.5 daily;  BP= 122/78 & she remains asymptomatic...  VENOUS INSUFFICIENCY (ICD-459.81) - low salt diet, support hose when nec... she reports hammer toe per podiatry but she doesn't want surg... ~  6/16: she is c/o incr edema in her ankles, she incr her hyzaar from 1/2 to 1 tab daily, rec to elim salt, elevate, wear support hose...  HYPERCHOLESTEROLEMIA (ICD-272.0) - We discussed diet +exercise to get weight down... ~  [place] 3/09 showed TChol 198, TG 80, HDL 59, LDL 123... not at goal- start Simvast20/d. ~  FLP 9/09 showed TChol 139, TG 74, HDL 47, LDL 77... doing well on Simva20, but "intol" per pt, refer to [facility]. ~  FLP 3/10 on Crestor2.5 showed TChol 151, TG 78, HDL 54,  LDL 82... continue same & [facility]. ~  FLP 9/10 showed TChol 159, TG 64, HDL 54, LDL 93 ~  FLP 12/10 showed TChol 164, TG 74, HDL 51, LDL 98 ~  FLP 3/11 showed TChol 163, TG 73, HDL 56, LDL 92 ~  FLP 12/11 on Cres2.5 showed TChol 172, TG 69, HDL 50, LDL 108... We reviewed diet & exercise... ~  FLP 6/12 on Cres2.5 showed TChol 165, TG 75, HDL 48, LDL 102... she requests change meds due to cost> try PRAV40 (she decided on 1/2) ~  [place] 12/12 on Cres2.5 showed TChol 173, TG 85, HDL 50, LDL 106... Hasn't switched to the PRAV40 yet! ~  [place] 6/13 on Prav40-1/2 showed TChol 189, TG 56, HDL 53, LDL 125... rec to take Qhs, get on diet, get wt down... ~  [place] 12/13 on Prav40-1/2 showed TChol 169, TG 71, HDL 49, LDL 106  ~  FLP 12/14 on Prav40-1/2 showed TChol 189, TG 60, HDL 51, LDL 126... She does not want to incr the Prav, discussed diet & wt reduction. ~  FLP 12/15 on Prav40-1/2 showed TChol 176, TG 75, HDL 43, LDL 118  GERD (ICD-530.81) IRRITABLE BOWEL SYNDROME (ICD-564.1) - followed by [name] and notes reviewed... he rec increased fiber, Benefiber, Robinul as needed... she prefers therapy from an herbalist and taking "daily cleanse pill" which helped; now she takes 2tablesp flax seeds in yogurt daily & improved... ~  prev colonoscopy 9/01 by [name] was WNL.Marland Kitchen.  ~  f/u colonoscopy 11/09 by [name] was neg- WNL, f/u planned 12yrs... ~  F/u colonoscopy 11/14 by [name] was neg- norm colon & f/u exam suggested in 54yrs...   Hx of CARCINOMA, RENAL CELL (ICD-189.0) - s/p right nephrectomy 2001 at [facility] by [facility]... he signed  off in 2006 and exam/ CXR's have been neg-  ~  f/u CXR 9/09 was clear, WNL... ~  12/10:  we discussed CXR (neg- WNL) & full labs (OK)& she will return for Fasting blood work... ~  12/11:  CXR remains neg- clear, WNL... Renal function normal w/ BUN=14, Creat=1.0 ~  12/12:  CXR remains clear, no lesions & labs similarly normal... Renal function normal w/ BUN=16,  Creat=1.1 ~  12/13:  CXR showed norm heart size, clear lungs, NAD;  Renal function remains norm w/ BUN= 15, Cr= 1.1 ~  12/14:  Labs remain stable with BUN= 15, Cr= 1.0 ~  12/15:  CXR showed norm heart size, clear lungs, NAD;  Renal function remains normal w/ BUN= 15, Cr= 1.0  DEGENERATIVE JOINT DISEASE (ICD-715.90) - she's improved w/ exercise program... but c/o bilat R>L knee discomfort w/ eval by Ortho (?who) w/ XRays and shot... doesn't want Rx- using OTC anti-inflamm + Tylenol... She notes that elliptical exercise helps her knees... ~  [facility]- all wnl... ~  Vit D level ~50 on supplements OTC per Gyn... ~  Labs here 12/15 showed Vit D = 40 ~  Pt is c/o foot pain, Podiatry eval w/ heel spus & they rec surg- we will refer to Ortho foot specialist for their eval...  Hx of HEADACHE (ICD-784.0)  ANXIETY (ICD-300.00) - she does not wish to have her Alpraz refilled...  Health Maintenance - GYN= [address] and she does PAPs and checked Vit D level on pt (pt reports taking 50000 u Vit D every other day now w/ her Vit D level = 55 during Dec11 OV)... she notes sister dx w/ breast cancer 2010 & she wants to wean off her hormone Rx, & has discussed this w/ Gyn... she gets regular Mammograms (last 1/14= neg) & [facility]> BMD 12/11 was WNL & she remains on Calcium, MVI, Vit D... ~  Immunizations:  Given TDAP here 2/12... Encouraged to get the yearly Flu vaccine... Given PNEUMOVAX 6/13 (66th birthday)...   Past Surgical History  Procedure Laterality Date  . Nephrectomy  2001    right = for renal cell CA  . Total abdominal hysterectomy      Outpatient Encounter Prescriptions as of 11/23/2015  Medication Sig  . ALPHA LIPOIC ACID PO Take by mouth daily.    Marland Kitchen aspirin 81 MG tablet Take 81 mg by mouth daily.    . Biotin 10 MG TABS Take by mouth daily.    . Coenzyme Q10 (COQ10) 100 MG CAPS Take by mouth daily.    . Evening Primrose Oil 1000 MG CAPS Take by  mouth daily.    Marland Kitchen losartan-hydrochlorothiazide (HYZAAR) 100-12.5 MG per tablet Take 1/2 tablet by mouth daily  . Multiple Vitamin (MULTIVITAMIN) capsule Take 1 capsule by mouth daily.    . pravastatin (PRAVACHOL) 40 MG tablet Take 1/2 tablet by mouth daily   No facility-administered encounter medications on file as of 11/23/2015.    No Known Allergies   Current Medications, Allergies, Past Medical History, Past Surgical History, Family History, and Social History were reviewed in [employer] record.    Review of Systems        See HPI - all other systems neg except as noted... She is c/o foot pain and some ankle edema. The patient denies anorexia, fever, weight loss, weight gain, vision loss, decreased hearing, hoarseness, chest pain, syncope, dyspnea on exertion, prolonged cough, headaches, hemoptysis,  abdominal pain, melena, hematochezia, severe indigestion/heartburn, hematuria, incontinence, muscle weakness, suspicious skin lesions, transient blindness, difficulty walking, depression, unusual weight change, abnormal bleeding, enlarged lymph nodes, and angioedema.     Objective:   Physical Exam     WD, Overweight, 70 y/o BF in NAD... GENERAL:  Alert & oriented; pleasant & cooperative... HEENT:  [place]/AT, EOM-wnl, PERRLA, EACs-clear, TMs-wnl, NOSE-clear, THROAT-clear & wnl. NECK:  Supple w/ fairROM; no JVD; normal carotid impulses w/o bruits; no thyromegaly or nodules palpated; no lymphadenopathy. CHEST:  Clear to P & A; without wheezes/ rales/ or rhonchi. HEART:  Regular Rhythm; without murmurs/ rubs/ or gallops. ABDOMEN:  Soft & nontender; normal bowel sounds; no organomegaly or masses detected. EXT: without deformities, mod arthritic changes w/ bilat knee crepitus on exam;  no varicose veins/ +venous insuffic/ tr edema. NEURO:  CN's intact; motor testing normal; sensory testing normal; gait normal & balance OK. DERM:  No lesions noted; no rash etc... flower  tatoo over right breast area...  RADIOLOGY DATA:  Reviewed in the EPIC EMR & discussed w/ the patient...  LABORATORY DATA:  Reviewed in the EPIC EMR & discussed w/ the patient...   Assessment & Plan:    HBP>  Controlled on Hyzaar, continue same + diet, exercise, get wt down...  VI, edema>  Needs better low salt diet, continue Hyzaar daily...  CHOL>  FLP good on PRAVASTATIN 40mg -1/2 w/ LDL = 83; continue same med & diet efforts...  GI>  GERD/ IBS>  States her bowels are better on flax seeds in yogurt per [name]...  GU>  s/p right nephrectomy for renal cell ca in 2001; renal funct remains WNL w/ Creat ~1.0 & CXR remains clear...  DJD>  She does elliptical exercises & states this helps her knees...  Other medical issues as noted...   Patient's Medications  New Prescriptions   No medications on file  Previous Medications   ALPHA LIPOIC ACID PO    Take by mouth daily.     ASPIRIN 81 MG TABLET    Take 81 mg by mouth daily.     BIOTIN 10 MG TABS    Take by mouth daily.     COENZYME Q10 (COQ10) 100 MG CAPS    Take by mouth daily.     EVENING PRIMROSE OIL 1000 MG CAPS    Take by mouth daily.     LOSARTAN-HYDROCHLOROTHIAZIDE (HYZAAR) 100-12.5 MG PER TABLET    Take 1/2 tablet by mouth daily   MULTIPLE VITAMIN (MULTIVITAMIN) CAPSULE    Take 1 capsule by mouth daily.     PRAVASTATIN (PRAVACHOL) 40 MG TABLET    Take 1/2 tablet by mouth daily  Modified Medications   No medications on file  Discontinued Medications   No medications on file

## 2015-11-29 ENCOUNTER — Ambulatory Visit: Payer: Medicare HMO

## 2015-11-30 ENCOUNTER — Telehealth: Payer: Self-pay | Admitting: Pulmonary Disease

## 2015-11-30 NOTE — Telephone Encounter (Signed)
Per SN: Result Note     Please notify patient>     FLP looks good on Diet + Pravastatin40 taking 1/2 tab daily...    Chems, CBC, Thyroid> ALL WNL.Marland KitchenMarland Kitchen    VitD is low end of norm & rec to add OTC Vit D supplement ~2000u daily...    Called spoke with patient and discussed her results / recs as stated by SN above.  Pt voiced her understanding and denied any questions or concerns at this time.  Pt will begin taking the OTC Vitamin D at 2000u daily.  This has been added to her med list.  Nothing further needed; will sign off.

## 2015-11-30 NOTE — Progress Notes (Signed)
Quick Note:  Per 2.7.17 phone note, pt is aware of lab results / recs as stated by SN. Vit D 2000u daily added to med list. ______

## 2015-12-02 ENCOUNTER — Ambulatory Visit
Admission: RE | Admit: 2015-12-02 | Discharge: 2015-12-02 | Disposition: A | Discharge status: Home / Self Care | Payer: Medicare HMO | Source: Ambulatory Visit

## 2015-12-02 DIAGNOSIS — Z1231 Encounter for screening mammogram for malignant neoplasm of breast: Secondary | ICD-10-CM

## 2015-12-07 ENCOUNTER — Ambulatory Visit: Payer: Medicare HMO | Admitting: Nurse Practitioner

## 2015-12-13 ENCOUNTER — Encounter: Payer: Self-pay | Admitting: Nurse Practitioner

## 2015-12-13 ENCOUNTER — Ambulatory Visit (INDEPENDENT_AMBULATORY_CARE_PROVIDER_SITE_OTHER): Payer: Medicare HMO | Admitting: Nurse Practitioner

## 2015-12-13 VITALS — BP 130/84 | HR 60 | Ht 65.5 in | Wt 193.0 lb

## 2015-12-13 DIAGNOSIS — Z0000 Encounter for general adult medical examination without abnormal findings: Secondary | ICD-10-CM

## 2015-12-13 DIAGNOSIS — Z01419 Encounter for gynecological examination (general) (routine) without abnormal findings: Secondary | ICD-10-CM

## 2015-12-13 LAB — HEPATITIS C ANTIBODY: HCV AB: NEGATIVE

## 2015-12-13 NOTE — Patient Instructions (Addendum)

## 2015-12-13 NOTE — Progress Notes (Signed)
Patient ID: Heidi [name], female   DOB: 1946-05-26, 70 y.o.   MRN: [id number]  69 y.o. [id number] Divorced  African American Fe here for annual exam.  She is feeling well.  Doing well with new exercise program with silver sneakers.  She is still with same partner for 8 yrs.  Not often SA due to his ED. She still goes to [place] to care for grandchildren.  Patient's last menstrual period was 10/23/1994 (approximate).          Sexually active: Yes.    The current method of family planning is status post hysterectomy.    Exercising: Yes.    weight training and cardio Smoker:  no  Health Maintenance: Pap:  03/15/00, WNL (TAH) MMG: 12/02/15, 3D, Bi-Rads 1:  Negative Colonoscopy:  09/10/13, normal, recall in 10 years BMD:  10/22/10, T Score, 1.4 Spine L1-L2 / -0.3 Right Femur Neck / -0.1 Left Femur Neck TDaP:  11/23/2010 Shingles: Never Pneumonia: has had at least one Hep C and HIV: discuss today, HIV not indicated due to age Labs: PCP in EPIC 11/23/15   reports that she quit smoking about 32 years ago. Her smoking use included Cigarettes. She has a 2.5 pack-year smoking history. She has never used smokeless tobacco. She reports that she drinks alcohol. She reports that she does not use illicit drugs.  Past Medical History  Diagnosis Date  . Chronic rhinitis   . HTN (hypertension)   . Venous insufficiency   . Hypercholesterolemia   . GERD (gastroesophageal reflux disease)   . IBS (irritable bowel syndrome)   . DJD (degenerative joint disease)   . History of headache   . Anxiety   . Renal cell carcinoma 2001    Past Surgical History  Procedure Laterality Date  . Nephrectomy  2001    right = for renal cell CA  . Total abdominal hysterectomy    . Tubal ligation      Current Outpatient Prescriptions  Medication Sig Dispense Refill  . ALPHA LIPOIC ACID PO Take by mouth daily.      Marland Kitchen aspirin 81 MG tablet Take 81 mg by mouth daily.      . Biotin 10 MG TABS Take by mouth daily.      .  cholecalciferol (VITAMIN D) 1000 units tablet Take 1,000 Units by mouth daily. Vitamin D.    . Coenzyme Q10 (COQ10) 100 MG CAPS Take by mouth daily.      . Evening Primrose Oil 1000 MG CAPS Take by mouth daily.      Marland Kitchen losartan-hydrochlorothiazide (HYZAAR) 100-12.5 MG per tablet Take 1/2 tablet by mouth daily 45 tablet 6  . Multiple Vitamin (MULTIVITAMIN) capsule Take 1 capsule by mouth daily.      . pravastatin (PRAVACHOL) 40 MG tablet Take 1/2 tablet by mouth daily 45 tablet 3   No current facility-administered medications for this visit.    Family History  Problem Relation Age of Onset  . Healthy Mother   . Prostate cancer Father   . Colon cancer Neg Hx   . Pancreatic cancer Neg Hx   . Stomach cancer Neg Hx   . Breast cancer Sister   . Cancer - Other Brother     neck ? lymph nodes    ROS:  Pertinent items are noted in HPI.  Otherwise, a comprehensive ROS was negative.  Exam:   BP 130/84 mmHg  Pulse 60  Ht 5' 5.5" (1.664 m)  Wt 193 lb (87.544 kg)  BMI  31.62 kg/m2  LMP 10/23/1994 (Approximate) Height: 5' 5.5" (166.4 cm) Ht Readings from Last 3 Encounters:  12/13/15 5' 5.5" (1.664 m)  11/23/15 5\' 5"  (1.651 m)  12/02/14 5\' 5"  (1.651 m)    General appearance: alert, cooperative and appears stated age Head: Normocephalic, without obvious abnormality, atraumatic Neck: no adenopathy, supple, symmetrical, trachea midline and thyroid normal to inspection and palpation Lungs: clear to auscultation bilaterally Breasts: normal appearance, no masses or tenderness Heart: regular rate and rhythm Abdomen: soft, non-tender; no masses,  no organomegaly Extremities: extremities normal, atraumatic, no cyanosis or edema Skin: Skin color, texture, turgor normal. No rashes or lesions Lymph nodes: Cervical, supraclavicular, and axillary nodes normal. No abnormal inguinal nodes palpated Neurologic: Grossly normal   Pelvic: External genitalia:  no lesions              Urethra:  normal  appearing urethra with no masses, tenderness or lesions              Bartholin's and Skene's: normal                 Vagina: normal appearing vagina with normal color and discharge, no lesions              Cervix: absent              Pap taken: No. Bimanual Exam:  Uterus:  uterus absent              Adnexa: no mass, fullness, tenderness               Rectovaginal: Confirms               Anus:  normal sphincter tone, no lesions  Chaperone present: no  A:  Well Woman with normal exam  S/P TAH secondary to fibroids 1981 on ERT until 10/2008 S/P right Nephrectomy secondary to cancer 08/2000   P:   Reviewed health and wellness pertinent to exam  Pap smear as above  Mammogram is due 11/2016  Follow with lab  Counseled on breast self exam, mammography screening, adequate intake of calcium and vitamin D, diet and exercise, Kegel's exercises return annually or prn  An After Visit Summary was printed and given to the patient.

## 2015-12-20 NOTE — Progress Notes (Signed)
Encounter reviewed by Dr. Prajna Vanderpool Amundson C. Silva.  

## 2016-02-04 ENCOUNTER — Encounter: Payer: Self-pay | Admitting: Pulmonary Disease

## 2016-02-07 NOTE — Telephone Encounter (Signed)
Per SN: HBP controlled on Hyzaar once daily Yes, it's okay to participate in a diet and exercise program  Email sent to patient with the above information Nothing further needed; will sign off

## 2016-02-07 NOTE — Telephone Encounter (Signed)
I am starting an exercise program at my [employer] location. They are asking that I do a set of pre class skills like push ups, and lunges. Because I have high blood pressure, they are asking for an ok from my doctor. They say an email approval is acceptable. Thanks.   (above email from patient) SN please advise if you're ok with this.  Thanks!

## 2016-02-07 NOTE — Telephone Encounter (Signed)
Patient called and is requesting an approval letter in order for her to join a gym because she has high blood pressure. She can be reached at [phone]. Thanks

## 2016-04-18 ENCOUNTER — Ambulatory Visit (INDEPENDENT_AMBULATORY_CARE_PROVIDER_SITE_OTHER): Payer: Medicare HMO | Admitting: Gastroenterology

## 2016-04-18 ENCOUNTER — Encounter: Payer: Self-pay | Admitting: Gastroenterology

## 2016-04-18 VITALS — BP 118/70 | HR 82 | Ht 65.5 in | Wt 189.0 lb

## 2016-04-18 DIAGNOSIS — K589 Irritable bowel syndrome without diarrhea: Secondary | ICD-10-CM | POA: Diagnosis not present

## 2016-04-18 DIAGNOSIS — K5901 Slow transit constipation: Secondary | ICD-10-CM

## 2016-04-18 MED ORDER — LINACLOTIDE 145 MCG PO CAPS: 145.0000 ug | ORAL_CAPSULE | Freq: Every day | ORAL | Status: DC

## 2016-04-18 NOTE — Patient Instructions (Signed)
We have sent the following medications to your pharmacy for you to pick up at your convenience:Linzess.  Please call us if your symptoms persist.   Thank you for choosing me and [facility].  Pricilla Riffle. Dagoberto Ligas., MD., Marval Regal

## 2016-04-18 NOTE — Progress Notes (Signed)
    History of Present Illness: This is a 70 year old female with a long history of IBS-C. Colonoscopy 08/2013 was normal. She relates having a bowel movement about twice per week. She often has lower abdominal discomfort and bloating. She states she has tried high fiber diets probiotics, ginger tea and other OTC treatments without consistent results. She states she tried her sisters Linzess for 1 day and it was effective for her. Denies weight loss, diarrhea, change in stool caliber, melena, hematochezia, nausea, vomiting, dysphagia, reflux symptoms, chest pain.  Current Medications, Allergies, Past Medical History, Past Surgical History, Family History and Social History were reviewed in [employer] record.  Physical Exam: General: Well developed, well nourished, no acute distress Head: Normocephalic and atraumatic Eyes:  sclerae anicteric, EOMI Ears: Normal auditory acuity Mouth: No deformity or lesions Lungs: Clear throughout to auscultation Heart: Regular rate and rhythm; no murmurs, rubs or bruits Abdomen: Soft, non tender and non distended. No masses, hepatosplenomegaly or hernias noted. Normal Bowel sounds Musculoskeletal: Symmetrical with no gross deformities  Pulses:  Normal pulses noted Extremities: No clubbing, cyanosis, edema or deformities noted Neurological: Alert oriented x 4, grossly nonfocal Psychological:  Alert and cooperative. Normal mood and affect  Assessment and Recommendations:  1. IBS-C. Linzess 145 mcg daily. Patient is advised to call if her symptoms are not adequately controlled or she has significant diarrhea. REV in 3 months.   2. GERD. Symptoms controlled with antireflux measures.

## 2016-04-22 ENCOUNTER — Ambulatory Visit (INDEPENDENT_AMBULATORY_CARE_PROVIDER_SITE_OTHER): Payer: Medicare HMO | Admitting: Family Medicine

## 2016-04-22 VITALS — HR 60 | Temp 97.7°F | Resp 17 | Ht 65.5 in | Wt 191.4 lb

## 2016-04-22 DIAGNOSIS — J329 Chronic sinusitis, unspecified: Secondary | ICD-10-CM | POA: Diagnosis not present

## 2016-04-22 DIAGNOSIS — J310 Chronic rhinitis: Secondary | ICD-10-CM

## 2016-04-22 DIAGNOSIS — L255 Unspecified contact dermatitis due to plants, except food: Secondary | ICD-10-CM

## 2016-04-22 MED ORDER — PREDNISONE 20 MG PO TABS: ORAL_TABLET | ORAL | Status: DC

## 2016-04-22 MED ORDER — TRIAMCINOLONE ACETONIDE 0.1 % EX CREA: 1.0000 "application " | TOPICAL_CREAM | Freq: Four times a day (QID) | CUTANEOUS | Status: DC

## 2016-04-22 NOTE — Progress Notes (Signed)
Subjective:  By signing my name below, I, Heidi [name], attest that this documentation has been prepared under the direction and in the presence of Delman Cheadle, MD. Electronically Signed: Moises [name], [place]. 04/22/2016 , 10:35 AM .  Patient was seen in Room 12 .   Patient ID: Heidi [name], female    DOB: 1946/09/14, 70 y.o.   MRN: [id number] Chief Complaint  Patient presents with  . Poison Ivy    upper chest area   HPI Heidi [name] is a 70 y.o. female who presents to [facility] complaining of rash all over her body noticed yesterday. Patient states she was cleaning and clearing her fence 2 days ago. She noticed small patches of itchy rash appear yesterday. She went to the [place] [facility] and was advised to try topical applicants. She's tried aloe vera, and baking soda. She denies taking any pills for this rash. She denies chest tightness, shortness of breath, or trouble swallowing.   Past Medical History  Diagnosis Date  . Chronic rhinitis   . HTN (hypertension)   . Venous insufficiency   . Hypercholesterolemia   . GERD (gastroesophageal reflux disease)   . IBS (irritable bowel syndrome)   . DJD (degenerative joint disease)   . History of headache   . Anxiety   . Renal cell carcinoma 2001   Prior to Admission medications   Medication Sig Start Date End Date Taking? Authorizing Provider  ALPHA LIPOIC ACID PO Take by mouth daily.     Yes Historical Provider, MD  aspirin 81 MG tablet Take 81 mg by mouth daily.     Yes Historical Provider, MD  Biotin 10 MG TABS Take by mouth daily.     Yes Historical Provider, MD  cholecalciferol (VITAMIN D) 1000 units tablet Take 1,000 Units by mouth daily. Vitamin D.   Yes Historical Provider, MD  Coenzyme Q10 (COQ10) 100 MG CAPS Take by mouth daily.     Yes Historical Provider, MD  Evening Primrose Oil 1000 MG CAPS Take by mouth daily.     Yes Historical Provider, MD  linaclotide [facility]) 145 MCG CAPS capsule Take 1 capsule (145 mcg total) by  mouth daily before breakfast. 04/18/16  Yes Ladene Artist, MD  losartan-hydrochlorothiazide [facility]) 100-12.5 MG per tablet Take 1/2 tablet by mouth daily 04/19/15  Yes Noralee Space, MD  Multiple Vitamin (MULTIVITAMIN) capsule Take 1 capsule by mouth daily.     Yes Historical Provider, MD  pravastatin (PRAVACHOL) 40 MG tablet Take 1/2 tablet by mouth daily 04/19/15  Yes Noralee Space, MD   No Known Allergies  Review of Systems  Constitutional: Negative for fever, chills and fatigue.  HENT: Positive for facial swelling. Negative for trouble swallowing.   Respiratory: Negative for cough, shortness of breath and wheezing.   Gastrointestinal: Negative for nausea, vomiting and diarrhea.  Skin: Positive for rash. Negative for wound.      Objective:   Physical Exam  Constitutional: She is oriented to person, place, and time. She appears well-developed and well-nourished. No distress.  HENT:  Head: Normocephalic and atraumatic.  Nose: Mucosal edema and rhinorrhea present.  Purulent rhinorrhea in nares bilaterally with edema  Eyes: EOM are normal. Pupils are equal, round, and reactive to light.  Neck: Neck supple.  Cardiovascular: Normal rate, regular rhythm and normal heart sounds.   Pulmonary/Chest: Effort normal and breath sounds normal. No respiratory distress.  Musculoskeletal: Normal range of motion.  Lymphadenopathy:    She has no cervical adenopathy.  Neurological: She is alert and oriented to person, place, and time.  Skin: Skin is warm and dry.  Some erythematous plaques with tiny pinpoint papules surrounding both orbits more on the lateral aspects down to the labia folds, some patches over shoulders and down forearms  Psychiatric: She has a normal mood and affect. Her behavior is normal.  Nursing note and vitals reviewed.  Pulse 60  Temp(Src) 97.7 F (36.5 C) (Oral)  Resp 17  Ht 5' 5.5" (1.664 m)  Wt 191 lb 6.4 oz (86.818 kg)  BMI 31.35 kg/m2  SpO2 99%  LMP 10/23/1994  (Approximate)    Assessment & Plan:  Okay to call in amoxicillin if sinusitis symptoms develop    1. Rhus dermatitis   2. Chronic rhinitis   3. Chronic sinusitis, unspecified location      Meds ordered this encounter  Medications  . predniSONE (DELTASONE) 20 MG tablet    Sig: Take 3 tabs qd x 3d, then 2 tabs qd x 3d then 1 tab qd x 3d.    Dispense:  18 tablet    Refill:  0  . triamcinolone cream (KENALOG) 0.1 %    Sig: Apply 1 application topically 4 (four) times daily.    Dispense:  453 g    Refill:  0    I personally performed the services described in this documentation, which was scribed in my presence. The recorded information has been reviewed and considered, and addended by me as needed.   Delman Cheadle, M.D.  [facility] [address] [place], [place] [address] [phone] phone [phone] fax  04/22/2016 5:58 PM

## 2016-04-22 NOTE — Patient Instructions (Addendum)
     IF you received an x-ray today, you will receive an invoice from [facility]. Please contact [facility] at [phone] with questions or concerns regarding your invoice.   IF you received labwork today, you will receive an invoice from [employer]. Please contact [facility] at [phone] with questions or concerns regarding your invoice.   Our billing staff will not be able to assist you with questions regarding bills from these companies.  You will be contacted with the lab results as soon as they are available. The fastest way to get your results is to activate your My Chart account. Instructions are located on the last page of this paperwork. If you have not heard from [place] regarding the results in 2 weeks, please contact this office.    Poison [employer] ivy is a inflammation of the skin (contact dermatitis) caused by touching the allergens on the leaves of the ivy plant following previous exposure to the plant. The rash usually appears 48 hours after exposure. The rash is usually bumps (papules) or blisters (vesicles) in a linear pattern. Depending on your own sensitivity, the rash may simply cause redness and itching, or it may also progress to blisters which may break open. These must be well cared for to prevent secondary bacterial (germ) infection, followed by scarring. Keep any open areas dry, clean, dressed, and covered with an antibacterial ointment if needed. The eyes may also get puffy. The puffiness is worst in the morning and gets better as the day progresses. This dermatitis usually heals without scarring, within 2 to 3 weeks without treatment. HOME CARE INSTRUCTIONS  Thoroughly wash with soap and water as soon as you have been exposed to poison ivy. You have about one half hour to remove the plant resin before it will cause the rash. This washing will destroy the oil or antigen on the skin that is causing, or will cause, the rash. Be  sure to wash under your fingernails as any plant resin there will continue to spread the rash. Do not rub skin vigorously when washing affected area. Poison ivy cannot spread if no oil from the plant remains on your body. A rash that has progressed to weeping sores will not spread the rash unless you have not washed thoroughly. It is also important to wash any clothes you have been wearing as these may carry active allergens. The rash will return if you wear the unwashed clothing, even several days later. Avoidance of the plant in the future is the best measure. Poison ivy plant can be recognized by the number of leaves. Generally, poison ivy has three leaves with flowering branches on a single stem. Diphenhydramine may be purchased over the counter and used as needed for itching. Do not drive with this medication if it makes you drowsy.Ask your caregiver about medication for children. SEEK MEDICAL CARE IF:  Open sores develop.  Redness spreads beyond area of rash.  You notice purulent (pus-like) discharge.  You have increased pain.  Other signs of infection develop (such as fever).   This information is not intended to replace advice given to you by your health care provider. Make sure you discuss any questions you have with your health care provider.   Document Released: 10/06/2000 Document Revised: 01/01/2012 Document Reviewed: 03/17/2015 Elsevier Interactive Patient Education [employer].

## 2016-05-03 ENCOUNTER — Other Ambulatory Visit: Payer: Self-pay | Admitting: Pulmonary Disease

## 2016-05-22 ENCOUNTER — Ambulatory Visit: Payer: Medicare HMO | Admitting: Pulmonary Disease

## 2016-06-08 ENCOUNTER — Ambulatory Visit (INDEPENDENT_AMBULATORY_CARE_PROVIDER_SITE_OTHER): Payer: Medicare HMO | Admitting: Pulmonary Disease

## 2016-06-08 ENCOUNTER — Encounter: Payer: Self-pay | Admitting: Pulmonary Disease

## 2016-06-08 VITALS — BP 118/76 | HR 52 | Temp 97.7°F | Ht 65.0 in | Wt 188.5 lb

## 2016-06-08 DIAGNOSIS — I10 Essential (primary) hypertension: Secondary | ICD-10-CM | POA: Diagnosis not present

## 2016-06-08 DIAGNOSIS — K5900 Constipation, unspecified: Secondary | ICD-10-CM

## 2016-06-08 DIAGNOSIS — F411 Generalized anxiety disorder: Secondary | ICD-10-CM

## 2016-06-08 DIAGNOSIS — R69 Illness, unspecified: Secondary | ICD-10-CM | POA: Diagnosis not present

## 2016-06-08 DIAGNOSIS — C641 Malignant neoplasm of right kidney, except renal pelvis: Secondary | ICD-10-CM | POA: Diagnosis not present

## 2016-06-08 DIAGNOSIS — E7800 Pure hypercholesterolemia, unspecified: Secondary | ICD-10-CM

## 2016-06-08 DIAGNOSIS — K6389 Other specified diseases of intestine: Secondary | ICD-10-CM | POA: Insufficient documentation

## 2016-06-08 HISTORY — DX: Other specified diseases of intestine: K63.89

## 2016-06-08 NOTE — Patient Instructions (Signed)
Today we updated your med list in our EPIC system...    Continue your current medications the same...  Keep up the good work w/ diet 7 exercise!  Call for any questions...  Let's plan a follow up visit in 66mo, sooner if needed for problems.Marland KitchenMarland Kitchen

## 2016-06-08 NOTE — Progress Notes (Signed)
Subjective:    Patient ID: Heidi [name], female    DOB: 09-16-1946, 70 y.o.   MRN: [id number]  HPI 70 y/o BF here for a follow up visit... she has mult med problems as noted below...   SEE PREV EPIC NOTES FOR EARLIER DATA >>   CXR 12/13 showed normal heart size, clear lungs, NAD.Marland Kitchen  EKG 12/13 showed SBrady, rate54, WNL, NAD...  LABS 12/13:  FLP- ok on Prav40 w/ LDL=106;  Chems- wnl;  CBC- wnl;  TSH=1.20;  UA=ok...  LABS 12/14:  FLP- ok x LDL=126 on Prav20;  Chems- wnl;  CBC- wnl;  TSH=1.91;  VitD=56...   ~  October 14, 2014:  43mo ROV & Heidi [name] reports doing satis just occas bladder infections and she will have this checked by Gyn soon (s/p Hyst yrs ago)... We reviewed the following medical problems during today's office visit >>     HBP> on ASA 81, Losartan/Hct 100/12.5-1/2 tab;  BP= 118/76 & she denies CP, palpit, SOB, dizzy, edema, etc...    Ven Insuffic> there is no edema & she is reminded to elim salt, elev legs, wear support hose, etc...    CHOL> on Prav40-1/2tab & tol well; FLP 12/15 showed TChol 176, TG 75, HDL 43, LDL 118... She knows to elim fat & keep wt down.    Overweight> wt is down 7# to 177#, BMI= 30, on her diet program called the "super shredded diet"...    GI> GERD, IBS> she takes Flax seed in [place]; has lactose intol but notes that peppermint tea helps- denies n/v, c/d, blood seen; last colon was 11/14 by [name] & neg; she also take a number of supplements per [name]...    Hx Renal cell ca> s/p right nephrectomy 2001; no known recurrence; renal function remains normal w/ labs 12/15 showing BUN=15, Creat=1.0 (stable).    DJD> using OTC meds and Tylenol; rec to continue exercise program & home therapy; developed achilles tendonitis 5/14 w/ shots & Pred per Podiatry.    Vit D defic> on VitD OTC supplement w/ VitD level 12/15 = 40 We reviewed prob list, meds, xrays and labs> see below for updates >>  OK 2015 Flu vaccine today...  CXR 12/15 showed norm heart size,  clear lungs, NAD.Marland KitchenMarland Kitchen   EKG 12/15 showed sinus brady, rate52, otherw wnl, NAD...  LABS 12/15:  FLP- ok x LDL=118;  Chems- wnl;  CBC- wnl;  TSH=1.80;  VitD=40...  ~  April 19, 2015:  19mo ROV & Heidi [name] is c/o bone spurs in her heels per Podiatry- she has seen DrZiegler, DrTom, & a lady podiatrist who wants to do surg; is is very concerned & not getting better w/ their therapy=> I suggest Orthopedic foot specialist consult- DrHewitt etal... She is also c/o ankle edema and her weight is up 8# to 192# today, she increased her Hyzaar100-12.5 from 1/2 tab to 1tab daily, & we discussed the need for NO SALT, elevation, support hose...     HBP> on ASA 81, Losartan/Hct 100/12.5 daily;  BP= 122/78 & she denies CP, palpit, SOB, dizzy, edema, etc...    Ven Insuffic, edema> she is reminded to elim salt, elev legs, wear support hose, etc...    CHOL> on Prav40-1/2tab & tol well; FLP 12/15 showed TChol 176, TG 75, HDL 43, LDL 118... She knows to elim fat & get wt down.    Overweight> wt is up 8# to 192#, BMI= 32, ?on her diet program called the "super shredded diet"; we reviewed  eating less, burning more.    GI> GERD, IBS> she takes Flax seed in [place]; has lactose intol but notes that peppermint tea helps- denies n/v, c/d, blood seen; last colon was 11/14 by [name] & neg; she also take a number of supplements per [name]...    Hx Renal cell ca> s/p right nephrectomy 2001; no known recurrence; renal function remains normal w/ labs 12/15 showing BUN=15, Creat=1.0 (stable).    DJD> using OTC meds and Tylenol; developed achilles tendonitis 5/14 w/ shots & Pred per Podiatry; now heel spurs & we will refer to Ortho....    Vit D defic> on VitD OTC supplement w/ VitD level 12/15 = 40 We reviewed prob list, meds, xrays and labs> see below for updates >>  PLAN>>  We discussed referral to Ortho foot specialist for their opinion regarding her heel spurs; asked to continue the Hyzaar one daily & elim salt, elev legs, wear  support hose, etc; we discussed her stress at work but she doesn't want anxiolytic rx (she plans to retire at end of yr); we reviewed diet + exercise...   ~  November 23, 2015:  15mo ROV & Heidi [name] tells me that she has retired "it feels like I am on vacation" (prev employ by [employer], [employer], [employer]);  She is doing satis w/o CP, palpit, dizzy/syncope, edema, etc; she denies cough, sput, SOB, f/c/s; she exercises daily at home w/o difficulty & joining silver sneakers;  She has noted some recent constipation- no abd pain, n/v, blood seen & her last colon was 11/14 by [name] (neg- no lesions seen)...     HBP> on ASA 81, Losartan/Hct 100/12.5 daily;  BP= 112/70 & she denies CP, palpit, SOB, dizzy, edema, etc...    Ven Insuffic, edema> she is reminded to elim salt, elev legs, wear support hose, etc...    CHOL> on Prav40-1/2tab & tol well; FLP 1/17 showed TChol 143, TG 76, HDL 45, LDL 83... She knows to elim fat & get wt down.    Overweight> wt is stable at 192#, BMI= 32, ?on her diet program called the "super shredded diet"; we reviewed eating less, burning more.    GI> GERD, IBS> she takes Flax seed in [place]; has lactose intol but notes that peppermint tea helps- denies n/v, c/d, blood seen; last colon was 11/14 by [name] & neg; she also take a number of supplements per [name]...    Hx Renal cell ca> s/p right nephrectomy 2001; no known recurrence; renal function remains normal w/ labs 1/17 showing BUN=13, Creat=0/97 (stable).    DJD> using OTC meds and Tylenol; developed achilles tendonitis 5/14 w/ shots & Pred per Podiatry; now heel spurs & we will refer to Ortho....    Vit D defic> on VitD OTC supplement w/ VitD level 12/17 = 32; reminded to take the OTC VitD supplement every day ~2000u... EXAM shows Afeb, VSS, O2sat=98% on RA;  Wt=192#;  HEENT- neg mallampati1;  Chest- clear w/o w/r/r;  Heart- RR w/o m/r/g;  Abd- soft, nondistended, norm BS w/o masses etc;  Ext- neg, w/o c/c/e...   LABS 11/23/15>  FLP- at  goals on Prav20;  Chems- wnl;  CBC- wnl;  TSH=1.74;  VitD=32...  IMP/PLAN>>  Ele[place] remains stable, doing satis;  Reminded about diet/ exercise/ wt reduction; continue same meds and supplements...  ~  June 08, 2016:  34mo ROV & Heidi [name] returns for a routine check up>  She saw [name] 04/18/16 for f/u of her IBS-C, last  colon 2014 was wnl, notes BMs ~2x/wk w/ some bloating 7 discomfort, no help from high fiber diets, probiotics, etc; they started Linzess145/d & she is improved...  She also saw her GYN team- [facility] 11/2015> note reviewed & pt doing satis... She has been going to Silver sneakers + [employer] for exercise 7 has lost a few lbs...    BP controlled on Hyzaar100-12.5> BP=118/76 & she denies CP, palpit, dizzy, SOB, edema...    She remains on Prav40 for her lipids> last FLP 1/17 was at goals and weight is coming down slowly...     IBS-c is improved w/ Linzess145 as noted above...    Hx renal cell ca> s/p right nephrectomy 2001, Cr has been stable at ~1.00 EXAM shows Afeb, VSS, O2sat=98% on RA;  Wt=189#;  HEENT- neg mallampati1;  Chest- clear w/o w/r/r;  Heart- RR w/o m/r/g;  Abd- soft, nondistended, norm BS w/o masses etc;  Ext- neg, w/o c/c/e...  IMP/PLAN>>  Heidi [name] remains stable on her current med regimen- continue same; we plan ROV w/ Fasting labs and f/u CXR in 40mo...          Problem List:   HYPERTENSION (ICD-401.9) - controlled on ASA 81mg /d, & LOSARTAN/ HCT 100-12.5 taking 1/2 tab daily...  ~  6/12: BP=128/78 and tol Rx well... she denies HA, fatigue, visual changes, CP, palipit, dizziness, syncope, dyspnea, edema, etc... ~  12/12: BP= 124/72 & doing well w/o symptoms... ~  6/13:  BP= 118/68 & she denies CP, palpit, SOB, dizzy, edema, etc... ~  12/13: on ASA 81, Losartan/Hct 100/12.5-1/2;  BP= 118/74 & she denies CP, palpit, SOB, dizzy, edema, etc...  ~  CXR 12/13 showed norm heart size, clear lungs, wnl/ NAD.Marland KitchenMarland Kitchen EKG 12/13 showed SBrady, rate54, wnl/ NAD... ~  6/14: on ASA 81,  Losartan/Hct 100/12.5-1/2 tab;  BP= 124/82 & she denies CP, palpit, SOB, dizzy, edema, etc ~  12/14: BP controlled on Hyzaar 100-12.5 daily & BP= 118/68 today w/o HAs, visualsymptoms, CP, palpit, SOB, edema, etc. ~  6/15: BP= 128/74 on Hyzaar 100-12.5 taking 1/2 tab daily... ~  12/15: on ASA 81, Losartan/Hct 100/12.5-1/2 tab;  BP= 118/76 & she denies CP, palpit, SOB, dizzy, edema, etc ~  6/16: on ASA 81, Losartan/Hct 100/12.5 daily;  BP= 122/78 & she remains asymptomatic...  VENOUS INSUFFICIENCY (ICD-459.81) - low salt diet, support hose when nec... she reports hammer toe per podiatry but she doesn't want surg... ~  6/16: she is c/o incr edema in her ankles, she incr her hyzaar from 1/2 to 1 tab daily, rec to elim salt, elevate, wear support hose...  HYPERCHOLESTEROLEMIA (ICD-272.0) - We discussed diet +exercise to get weight down... ~  [place] 3/09 showed TChol 198, TG 80, HDL 59, LDL 123... not at goal- start Simvast20/d. ~  FLP 9/09 showed TChol 139, TG 74, HDL 47, LDL 77... doing well on Simva20, but "intol" per pt, refer to [facility]. ~  FLP 3/10 on Crestor2.5 showed TChol 151, TG 78, HDL 54, LDL 82... continue same & [facility]. ~  FLP 9/10 showed TChol 159, TG 64, HDL 54, LDL 93 ~  FLP 12/10 showed TChol 164, TG 74, HDL 51, LDL 98 ~  FLP 3/11 showed TChol 163, TG 73, HDL 56, LDL 92 ~  FLP 12/11 on Cres2.5 showed TChol 172, TG 69, HDL 50, LDL 108... We reviewed diet & exercise... ~  FLP 6/12 on Cres2.5 showed TChol 165, TG 75, HDL 48, LDL 102... she requests change meds due  to cost> try PRAV40 (she decided on 1/2) ~  [place] 12/12 on Cres2.5 showed TChol 173, TG 85, HDL 50, LDL 106... Hasn't switched to the PRAV40 yet! ~  [place] 6/13 on Prav40-1/2 showed TChol 189, TG 56, HDL 53, LDL 125... rec to take Qhs, get on diet, get wt down... ~  [place] 12/13 on Prav40-1/2 showed TChol 169, TG 71, HDL 49, LDL 106  ~  FLP 12/14 on Prav40-1/2 showed TChol 189, TG 60, HDL 51, LDL 126... She does not want to incr the  Prav, discussed diet & wt reduction. ~  FLP 12/15 on Prav40-1/2 showed TChol 176, TG 75, HDL 43, LDL 118  GERD (ICD-530.81) IRRITABLE BOWEL SYNDROME (ICD-564.1) - followed by [name] and notes reviewed... he rec increased fiber, Benefiber, Robinul as needed... she prefers therapy from an herbalist and taking "daily cleanse pill" which helped; now she takes 2tablesp flax seeds in yogurt daily & improved... ~  prev colonoscopy 9/01 by [name] was WNL.Marland Kitchen.  ~  f/u colonoscopy 11/09 by [name] was neg- WNL, f/u planned 35yrs... ~  F/u colonoscopy 11/14 by [name] was neg- norm colon & f/u exam suggested in 36yrs...   Hx of CARCINOMA, RENAL CELL (ICD-189.0) - s/p right nephrectomy 2001 [facility] by [facility]... he signed off in 2006 and exam/ CXR's have been neg-  ~  f/u CXR 9/09 was clear, WNL... ~  12/10:  we discussed CXR (neg- WNL) & full labs (OK)& she will return for Fasting blood work... ~  12/11:  CXR remains neg- clear, WNL... Renal function normal w/ BUN=14, Creat=1.0 ~  12/12:  CXR remains clear, no lesions & labs similarly normal... Renal function normal w/ BUN=16, Creat=1.1 ~  12/13:  CXR showed norm heart size, clear lungs, NAD;  Renal function remains norm w/ BUN= 15, Cr= 1.1 ~  12/14:  Labs remain stable with BUN= 15, Cr= 1.0 ~  12/15:  CXR showed norm heart size, clear lungs, NAD;  Renal function remains normal w/ BUN= 15, Cr= 1.0  DEGENERATIVE JOINT DISEASE (ICD-715.90) - she's improved w/ exercise program... but c/o bilat R>L knee discomfort w/ eval by Ortho (?who) w/ XRays and shot... doesn't want Rx- using OTC anti-inflamm + Tylenol... She notes that elliptical exercise helps her knees... ~  [facility] all wnl... ~  Vit D level ~50 on supplements OTC per Gyn... ~  Labs here 12/15 showed Vit D = 40 ~  Pt is c/o foot pain, Podiatry eval w/ heel spus & they rec surg- we will refer to Ortho foot specialist for their eval...  Hx of HEADACHE  (ICD-784.0)  ANXIETY (ICD-300.00) - she does not wish to have her Alpraz refilled...  Health Maintenance - GYN= [facility] and she does PAPs and checked Vit D level on pt (pt reports taking 50000 u Vit D every other day now w/ her Vit D level = 55 during Dec11 OV)... she notes sister dx w/ breast cancer 2010 & she wants to wean off her hormone Rx, & has discussed this w/ Gyn... she gets regular Mammograms (last 1/14= neg) & BMD[facility]> BMD 12/11 was WNL & she remains on Calcium, MVI, Vit D... ~  Immunizations:  Given TDAP here 2/12... Encouraged to get the yearly Flu vaccine... Given PNEUMOVAX 6/13 (66th birthday)...   Past Surgical History:  Procedure Laterality Date  . NEPHRECTOMY  2001   right = for renal cell CA  . TOTAL ABDOMINAL HYSTERECTOMY    .  TUBAL LIGATION      Outpatient Encounter Prescriptions as of 06/08/2016  Medication Sig Dispense Refill  . ALPHA LIPOIC ACID PO Take by mouth daily.      Marland Kitchen aspirin 81 MG tablet Take 81 mg by mouth daily.      . Biotin 10 MG TABS Take by mouth daily.      . cholecalciferol (VITAMIN D) 1000 units tablet Take 1,000 Units by mouth daily. Vitamin D.    . Coenzyme Q10 (COQ10) 100 MG CAPS Take by mouth daily.      . Evening Primrose Oil 1000 MG CAPS Take by mouth daily.      Marland Kitchen linaclotide (LINZESS) 145 MCG CAPS capsule Take 1 capsule (145 mcg total) by mouth daily before breakfast. 30 capsule 11  . losartan-hydrochlorothiazide (HYZAAR) 100-12.5 MG tablet TAKE ONE-HALF TABLET BY MOUTH ONCE DAILY 45 tablet 0  . Multiple Vitamin (MULTIVITAMIN) capsule Take 1 capsule by mouth daily.      . pravastatin (PRAVACHOL) 40 MG tablet TAKE ONE-HALF TABLET BY MOUTH ONCE DAILY 45 tablet 0  . [DISCONTINUED] predniSONE (DELTASONE) 20 MG tablet Take 3 tabs qd x 3d, then 2 tabs qd x 3d then 1 tab qd x 3d. (Patient not taking: Reported on 06/08/2016) 18 tablet 0  . [DISCONTINUED] triamcinolone cream (KENALOG) 0.1 % Apply 1 application topically 4 (four)  times daily. (Patient not taking: Reported on 06/08/2016) 453 g 0   No facility-administered encounter medications on file as of 06/08/2016.     No Known Allergies   Current Medications, Allergies, Past Medical History, Past Surgical History, Family History, and Social History were reviewed in [employer] record.    Review of Systems        See HPI - all other systems neg except as noted... She is c/o foot pain and some ankle edema. The patient denies anorexia, fever, weight loss, weight gain, vision loss, decreased hearing, hoarseness, chest pain, syncope, dyspnea on exertion, prolonged cough, headaches, hemoptysis, abdominal pain, melena, hematochezia, severe indigestion/heartburn, hematuria, incontinence, muscle weakness, suspicious skin lesions, transient blindness, difficulty walking, depression, unusual weight change, abnormal bleeding, enlarged lymph nodes, and angioedema.     Objective:   Physical Exam     WD, Overweight, 70 y/o BF in NAD... GENERAL:  Alert & oriented; pleasant & cooperative... HEENT:  Heidi [name]/AT, EOM-wnl, PERRLA, EACs-clear, TMs-wnl, NOSE-clear, THROAT-clear & wnl. NECK:  Supple w/ fairROM; no JVD; normal carotid impulses w/o bruits; no thyromegaly or nodules palpated; no lymphadenopathy. CHEST:  Clear to P & A; without wheezes/ rales/ or rhonchi. HEART:  Regular Rhythm; without murmurs/ rubs/ or gallops. ABDOMEN:  Soft & nontender; normal bowel sounds; no organomegaly or masses detected. EXT: without deformities, mod arthritic changes w/ bilat knee crepitus on exam;  no varicose veins/ +venous insuffic/ tr edema. NEURO:  CN's intact; motor testing normal; sensory testing normal; gait normal & balance OK. DERM:  No lesions noted; no rash etc... flower tatoo over right breast area...  RADIOLOGY DATA:  Reviewed in the EPIC EMR & discussed w/ the patient...  LABORATORY DATA:  Reviewed in the EPIC EMR & discussed w/ the  patient...   Assessment & Plan:    HBP>  Controlled on Hyzaar, continue same + diet, exercise, get wt down...  VI, edema>  Needs better low salt diet, continue Hyzaar daily...  CHOL>  FLP good on PRAVASTATIN 40mg -1/2 w/ LDL = 83; continue same med & diet efforts...  GI>  GERD/ IBS>  States her  bowels are better on flax seeds in yogurt per [name]...  GU>  s/p right nephrectomy for renal cell ca in 2001; renal funct remains WNL w/ Creat ~1.0 & CXR remains clear...  DJD>  She does elliptical exercises & states this helps her knees...  Other medical issues as noted...   Patient's Medications  New Prescriptions   No medications on file  Previous Medications   ALPHA LIPOIC ACID PO    Take by mouth daily.     ASPIRIN 81 MG TABLET    Take 81 mg by mouth daily.     BIOTIN 10 MG TABS    Take by mouth daily.     CHOLECALCIFEROL (VITAMIN D) 1000 UNITS TABLET    Take 1,000 Units by mouth daily. Vitamin D.   COENZYME Q10 (COQ10) 100 MG CAPS    Take by mouth daily.     EVENING PRIMROSE OIL 1000 MG CAPS    Take by mouth daily.     LINACLOTIDE (LINZESS) 145 MCG CAPS CAPSULE    Take 1 capsule (145 mcg total) by mouth daily before breakfast.   LOSARTAN-HYDROCHLOROTHIAZIDE (HYZAAR) 100-12.5 MG TABLET    TAKE ONE-HALF TABLET BY MOUTH ONCE DAILY   MULTIPLE VITAMIN (MULTIVITAMIN) CAPSULE    Take 1 capsule by mouth daily.     PRAVASTATIN (PRAVACHOL) 40 MG TABLET    TAKE ONE-HALF TABLET BY MOUTH ONCE DAILY  Modified Medications   No medications on file  Discontinued Medications   PREDNISONE (DELTASONE) 20 MG TABLET    Take 3 tabs qd x 3d, then 2 tabs qd x 3d then 1 tab qd x 3d.   TRIAMCINOLONE CREAM (KENALOG) 0.1 %    Apply 1 application topically 4 (four) times daily.

## 2016-07-24 ENCOUNTER — Other Ambulatory Visit: Payer: Self-pay | Admitting: Pulmonary Disease

## 2016-07-26 ENCOUNTER — Other Ambulatory Visit: Payer: Self-pay | Admitting: Pulmonary Disease

## 2016-09-21 ENCOUNTER — Telehealth: Payer: Self-pay | Admitting: Pulmonary Disease

## 2016-09-21 NOTE — Telephone Encounter (Signed)
Called and spoke to pt. Pt is requesting to have pneumonia vaccine and flu vaccine. Last pneumonia vaccine was Pneumovax on 6.20.2013.  Dr. Lenna Gilford please advise if ok for both vaccines and if flu needs to be high dose or not. Thanks.

## 2016-09-21 NOTE — Telephone Encounter (Signed)
Called and spoke with pt and she is aware of SN recs below---  Get the flu shot now--she stated that she will get this at [facility] Will do the Prevnar 13 in Feb 2018 at her appt Will need the PNA 23 vaccine in 2019.    Pt is aware of these recs. Nothing further is needed.

## 2016-10-27 ENCOUNTER — Other Ambulatory Visit: Payer: Self-pay | Admitting: Pulmonary Disease

## 2016-10-27 DIAGNOSIS — R69 Illness, unspecified: Secondary | ICD-10-CM | POA: Diagnosis not present

## 2016-11-17 ENCOUNTER — Other Ambulatory Visit: Payer: Self-pay | Admitting: Pulmonary Disease

## 2016-11-17 DIAGNOSIS — Z1231 Encounter for screening mammogram for malignant neoplasm of breast: Secondary | ICD-10-CM

## 2016-11-18 ENCOUNTER — Other Ambulatory Visit: Payer: Self-pay | Admitting: Pulmonary Disease

## 2016-11-22 ENCOUNTER — Other Ambulatory Visit: Payer: Self-pay | Admitting: Pulmonary Disease

## 2016-11-22 MED ORDER — LOSARTAN POTASSIUM-HCTZ 100-12.5 MG PO TABS: 0.5000 | ORAL_TABLET | Freq: Every day | ORAL | 6 refills | Status: DC

## 2016-11-30 DIAGNOSIS — H1089 Other conjunctivitis: Secondary | ICD-10-CM | POA: Diagnosis not present

## 2016-12-11 ENCOUNTER — Ambulatory Visit (INDEPENDENT_AMBULATORY_CARE_PROVIDER_SITE_OTHER): Payer: Medicare HMO | Admitting: Pulmonary Disease

## 2016-12-11 ENCOUNTER — Other Ambulatory Visit (INDEPENDENT_AMBULATORY_CARE_PROVIDER_SITE_OTHER): Payer: Medicare HMO

## 2016-12-11 ENCOUNTER — Ambulatory Visit (INDEPENDENT_AMBULATORY_CARE_PROVIDER_SITE_OTHER)
Admission: RE | Admit: 2016-12-11 | Discharge: 2016-12-11 | Disposition: A | Discharge status: Home / Self Care | Payer: Medicare HMO | Source: Ambulatory Visit | Attending: Pulmonary Disease | Admitting: Pulmonary Disease

## 2016-12-11 VITALS — BP 110/80 | HR 67 | Temp 97.2°F | Ht 65.0 in | Wt 193.2 lb

## 2016-12-11 DIAGNOSIS — E7800 Pure hypercholesterolemia, unspecified: Secondary | ICD-10-CM

## 2016-12-11 DIAGNOSIS — I10 Essential (primary) hypertension: Secondary | ICD-10-CM

## 2016-12-11 DIAGNOSIS — E559 Vitamin D deficiency, unspecified: Secondary | ICD-10-CM

## 2016-12-11 DIAGNOSIS — I872 Venous insufficiency (chronic) (peripheral): Secondary | ICD-10-CM

## 2016-12-11 DIAGNOSIS — R69 Illness, unspecified: Secondary | ICD-10-CM | POA: Diagnosis not present

## 2016-12-11 DIAGNOSIS — K219 Gastro-esophageal reflux disease without esophagitis: Secondary | ICD-10-CM

## 2016-12-11 DIAGNOSIS — M159 Polyosteoarthritis, unspecified: Secondary | ICD-10-CM

## 2016-12-11 DIAGNOSIS — K5900 Constipation, unspecified: Secondary | ICD-10-CM

## 2016-12-11 DIAGNOSIS — Z23 Encounter for immunization: Secondary | ICD-10-CM

## 2016-12-11 DIAGNOSIS — F411 Generalized anxiety disorder: Secondary | ICD-10-CM

## 2016-12-11 DIAGNOSIS — C641 Malignant neoplasm of right kidney, except renal pelvis: Secondary | ICD-10-CM

## 2016-12-11 DIAGNOSIS — M150 Primary generalized (osteo)arthritis: Secondary | ICD-10-CM

## 2016-12-11 LAB — CBC WITH DIFFERENTIAL/PLATELET
BASOS ABS: 0.1 10*3/uL (ref 0.0–0.1)
Basophils Relative: 0.9 % (ref 0.0–3.0)
EOS ABS: 0.2 10*3/uL (ref 0.0–0.7)
Eosinophils Relative: 3.5 % (ref 0.0–5.0)
HCT: 41.8 % (ref 36.0–46.0)
Hemoglobin: 13.9 g/dL (ref 12.0–15.0)
LYMPHS ABS: 2.4 10*3/uL (ref 0.7–4.0)
LYMPHS PCT: 36.4 % (ref 12.0–46.0)
MCHC: 33.2 g/dL (ref 30.0–36.0)
MCV: 89.7 fl (ref 78.0–100.0)
Monocytes Absolute: 0.5 10*3/uL (ref 0.1–1.0)
Monocytes Relative: 8.1 % (ref 3.0–12.0)
NEUTROS PCT: 51.1 % (ref 43.0–77.0)
Neutro Abs: 3.3 10*3/uL (ref 1.4–7.7)
Platelets: 316 10*3/uL (ref 150.0–400.0)
RBC: 4.66 Mil/uL (ref 3.87–5.11)
RDW: 14.8 % (ref 11.5–15.5)
WBC: 6.5 10*3/uL (ref 4.0–10.5)

## 2016-12-11 LAB — COMPREHENSIVE METABOLIC PANEL
ALK PHOS: 70 U/L (ref 39–117)
ALT: 14 U/L (ref 0–35)
AST: 16 U/L (ref 0–37)
Albumin: 4.5 g/dL (ref 3.5–5.2)
BILIRUBIN TOTAL: 0.6 mg/dL (ref 0.2–1.2)
BUN: 13 mg/dL (ref 6–23)
CO2: 29 meq/L (ref 19–32)
Calcium: 9.9 mg/dL (ref 8.4–10.5)
Chloride: 105 mEq/L (ref 96–112)
Creatinine, Ser: 1 mg/dL (ref 0.40–1.20)
GFR: 70.56 mL/min (ref >60.00)
GLUCOSE: 92 mg/dL (ref 70–99)
Potassium: 4.3 mEq/L (ref 3.5–5.1)
SODIUM: 140 meq/L (ref 135–145)
TOTAL PROTEIN: 7.7 g/dL (ref 6.0–8.3)

## 2016-12-11 LAB — LIPID PANEL
CHOL/HDL RATIO: 3
Cholesterol: 177 mg/dL (ref 0–200)
HDL: 51.3 mg/dL (ref >39.00)
LDL Cholesterol: 104 mg/dL — ABNORMAL HIGH (ref 0–99)
NONHDL: 125.29
Triglycerides: 108 mg/dL (ref 0.0–149.0)
VLDL: 21.6 mg/dL (ref 0.0–40.0)

## 2016-12-11 LAB — TSH: TSH: 2.46 u[IU]/mL (ref 0.35–4.50)

## 2016-12-11 LAB — VITAMIN D 25 HYDROXY (VIT D DEFICIENCY, FRACTURES): VITD: 39.16 ng/mL (ref 30.00–100.00)

## 2016-12-11 MED ORDER — LOSARTAN POTASSIUM-HCTZ 100-12.5 MG PO TABS: 0.5000 | ORAL_TABLET | Freq: Every day | ORAL | 11 refills | Status: DC

## 2016-12-11 MED ORDER — PRAVASTATIN SODIUM 40 MG PO TABS: 20.0000 mg | ORAL_TABLET | Freq: Every day | ORAL | 11 refills | Status: DC

## 2016-12-11 NOTE — Patient Instructions (Addendum)
Today we updated your med list in our EPIC system...    Continue your current medications the same...  Today we checked a follow up CXR & your FASTING blood work...    We will contact you w/ the results when available...   Let's get on track w/ our diet & exercise program...  Call for any questions...  Let's plan a follow up visit in 83mo, sooner if needed for any problems.Marland KitchenMarland Kitchen

## 2016-12-12 ENCOUNTER — Encounter: Payer: Self-pay | Admitting: Nurse Practitioner

## 2016-12-12 ENCOUNTER — Encounter: Payer: Self-pay | Admitting: Pulmonary Disease

## 2016-12-12 NOTE — Progress Notes (Signed)
Patient ID: Heidi [name], female   DOB: 07-03-1946, 71 y.o.   MRN: [id number]  71 y.o. [id number] Divorced  African American Fe here for annual exam.  Recent tooth abscess and left eye infection.  Both are now resolved..  Same partner X 11 yrs.  Patient's last menstrual period was 10/23/1994 (approximate).    S/P TAH for fibroids      Sexually active: Yes.    The current method of family planning is status post hysterectomy.    Exercising: Yes.    Gym/ health club routine includes cardio, light weights, stationary bike and yoga. Smoker:  no  Health Maintenance: Pap: 03/15/00, WNL (TAH) MMG: 12/02/15, 3D, Bi-Rads 1:  Negative, scheduled for 01/03/17 Colonoscopy: 09/10/13, normal, recall in 10 years BMD: 10/22/10, T Score, 1.4 Spine L1-L2 / -0.3 Right Femur Neck / -0.1 Left Femur Neck TDaP: 11/23/2010 Shingles: Never - will get at pharmacy Flu vaccine:  10/27/16 Pneumonia: 04/11/12 Pneumovax, 12/11/16 Prevnar-13 - had dizziness and disoriented after injection Hep C: 12/13/15 HIV: Not indicated due to age Labs: 12/11/16 with PCP in EPIC   reports that she quit smoking about 33 years ago. Her smoking use included Cigarettes. She has a 2.50 pack-year smoking history. She has never used smokeless tobacco. She reports that she drinks alcohol. She reports that she does not use drugs.  Past Medical History:  Diagnosis Date  . Anxiety   . Chronic rhinitis   . DJD (degenerative joint disease)   . GERD (gastroesophageal reflux disease)   . History of headache   . HTN (hypertension)   . Hypercholesterolemia   . IBS (irritable bowel syndrome)   . Renal cell carcinoma 2001  . Venous insufficiency     Past Surgical History:  Procedure Laterality Date  . NEPHRECTOMY  2001   right = for renal cell CA  . TOTAL ABDOMINAL HYSTERECTOMY    . TUBAL LIGATION      Current Outpatient Prescriptions  Medication Sig Dispense Refill  . ALPHA LIPOIC ACID PO Take by mouth daily.      Marland Kitchen aspirin 81 MG tablet Take 81  mg by mouth daily.      . Biotin 10 MG TABS Take by mouth daily.      . cholecalciferol (VITAMIN D) 1000 units tablet Take 1,000 Units by mouth daily. Vitamin D.    . Coenzyme Q10 (COQ10) 100 MG CAPS Take by mouth daily.      . Evening Primrose Oil 1000 MG CAPS Take by mouth daily.      Marland Kitchen linaclotide (LINZESS) 145 MCG CAPS capsule Take 1 capsule (145 mcg total) by mouth daily before breakfast. 30 capsule 11  . losartan-hydrochlorothiazide (HYZAAR) 100-12.5 MG tablet Take 0.5 tablets by mouth daily. 45 tablet 11  . Multiple Vitamin (MULTIVITAMIN) capsule Take 1 capsule by mouth daily.      . pravastatin (PRAVACHOL) 40 MG tablet Take 0.5 tablets (20 mg total) by mouth daily. 45 tablet 11   No current facility-administered medications for this visit.     Family History  Problem Relation Age of Onset  . Healthy Mother   . Diabetes Mother   . Hyperlipidemia Mother   . Hypertension Mother   . Prostate cancer Father   . Cancer Father   . Breast cancer Sister   . Cancer - Other Brother     neck ? lymph nodes  . Colon cancer Neg Hx   . Pancreatic cancer Neg Hx   . Stomach cancer Neg Hx  ROS:  Pertinent items are noted in HPI.  Otherwise, a comprehensive ROS was negative.  Exam:   BP 122/76 (BP Location: Right Arm, Patient Position: Sitting, Cuff Size: Large)   Resp (!) 64   Ht 5' 5.25" (1.657 m)   Wt 190 lb (86.2 kg)   LMP 10/23/1994 (Approximate)   BMI 31.38 kg/m  Height: 5' 5.25" (165.7 cm) Ht Readings from Last 3 Encounters:  12/13/16 5' 5.25" (1.657 m)  12/11/16 5\' 5"  (1.651 m)  06/08/16 5\' 5"  (1.651 m)    General appearance: alert, cooperative and appears stated age Head: Normocephalic, without obvious abnormality, atraumatic Neck: no adenopathy, supple, symmetrical, trachea midline and thyroid normal to inspection and palpation Lungs: clear to auscultation bilaterally Breasts: normal appearance, no masses or tenderness Heart: regular rate and rhythm Abdomen: soft,  non-tender; no masses,  no organomegaly Extremities: extremities normal, atraumatic, no cyanosis or edema.  Left deltoid area with local reaction to recent Prevnar 13.  Area with surrounding warmth and redness about 3 X 4 inches in diameter. Skin: Skin color, texture, turgor normal. No rashes or lesions Lymph nodes: Cervical, supraclavicular, and axillary nodes normal. No abnormal inguinal nodes palpated Neurologic: Grossly normal   Pelvic: External genitalia:  no lesions              Urethra:  normal appearing urethra with no masses, tenderness or lesions              Bartholin's and Skene's: normal                 Vagina: normal appearing vagina with normal color and discharge, no lesions              Cervix: absent              Pap taken: No. Bimanual Exam:  Uterus:  uterus absent              Adnexa: no mass, fullness, tenderness               Rectovaginal: Confirms               Anus:  normal sphincter tone, no lesions  Chaperone present: yes  A:   Well Woman with normal exam  S/P TAH secondary to fibroids 1981 on ERT until 10/2008 S/P right Nephrectomy secondary to cancer 08/2000 History of HTN, hypercholesterolemia   Local reaction to Prevnar 13 given on 12/11/16 along with dizziness and disorientation X 1 day.  P:   Reviewed health and wellness pertinent to exam  Pap smear not done  Mammogram is due 01/03/17  Added BMD [facility] -hopefully same day - she will call and schedule  Advised cool / cold compresses today and if continues to call PCP  Counseled on breast self exam, mammography screening, adequate intake of calcium and vitamin D, diet and exercise, Kegel's exercises return annually or prn  An After Visit Summary was printed and given to the patient.

## 2016-12-12 NOTE — Progress Notes (Signed)
Subjective:    Patient ID: Heidi [name], female    DOB: October 30, 1945, 71 y.o.   MRN: [id number]  HPI 71 y/o BF here for a follow up visit... she has mult med problems as noted below...   SEE PREV EPIC NOTES FOR EARLIER DATA >>    CXR 12/13 showed normal heart size, clear lungs, NAD.Marland Kitchen  EKG 12/13 showed SBrady, rate54, WNL, NAD...  LABS 12/13:  FLP- ok on Prav40 w/ LDL=106;  Chems- wnl;  CBC- wnl;  TSH=1.20;  UA=ok...  LABS 12/14:  FLP- ok x LDL=126 on Prav20;  Chems- wnl;  CBC- wnl;  TSH=1.91;  VitD=56...   CXR 12/15 showed norm heart size, clear lungs, NAD.Marland KitchenMarland Kitchen   EKG 12/15 showed sinus brady, rate52, otherw wnl, NAD...  LABS 12/15:  FLP- ok x LDL=118;  Chems- wnl;  CBC- wnl;  TSH=1.80;  VitD=40...   ~  April 19, 2015:  51mo ROV & Heidi [name] is c/o bone spurs in her heels per Podiatry- she has seen DrZiegler, DrTom, & a lady podiatrist who wants to do surg; is is very concerned & not getting better w/ their therapy=> I suggest Orthopedic foot specialist consult- DrHewitt etal... She is also c/o ankle edema and her weight is up 8# to 192# today, she increased her Hyzaar100-12.5 from 1/2 tab to 1tab daily, & we discussed the need for NO SALT, elevation, support hose...     HBP> on ASA 81, Losartan/Hct 100/12.5 daily;  BP= 122/78 & she denies CP, palpit, SOB, dizzy, edema, etc...    Ven Insuffic, edema> she is reminded to elim salt, elev legs, wear support hose, etc...    CHOL> on Prav40-1/2tab & tol well; FLP 12/15 showed TChol 176, TG 75, HDL 43, LDL 118... She knows to elim fat & get wt down.    Overweight> wt is up 8# to 192#, BMI= 32, ?on her diet program called the "super shredded diet"; we reviewed eating less, burning more.    GI> GERD, IBS> she takes Flax seed in [place]; has lactose intol but notes that peppermint tea helps- denies n/v, c/d, blood seen; last colon was 11/14 by [name] & neg; she also take a number of supplements per [name]...    Hx Renal cell ca> s/p right  nephrectomy 2001; no known recurrence; renal function remains normal w/ labs 12/15 showing BUN=15, Creat=1.0 (stable).    DJD> using OTC meds and Tylenol; developed achilles tendonitis 5/14 w/ shots & Pred per Podiatry; now heel spurs & we will refer to Ortho....    Vit D defic> on VitD OTC supplement w/ VitD level 12/15 = 40 We reviewed prob list, meds, xrays and labs> see below for updates >>  PLAN>>  We discussed referral to Ortho foot specialist for their opinion regarding her heel spurs; asked to continue the Hyzaar one daily & elim salt, elev legs, wear support hose, etc; we discussed her stress at work but she doesn't want anxiolytic rx (she plans to retire at end of yr); we reviewed diet + exercise...   ~  November 23, 2015:  61mo ROV & Heidi [name] tells me that she has retired "it feels like I am on vacation" (prev employ by [employer], [employer], [employer]);  She is doing satis w/o CP, palpit, dizzy/syncope, edema, etc; she denies cough, sput, SOB, f/c/s; she exercises daily at home w/o difficulty & joining silver sneakers;  She has noted some recent constipation- no abd pain, n/v, blood seen & her last colon was 11/14  by [name] (neg- no lesions seen)...     HBP> on ASA 81, Losartan/Hct 100/12.5 daily;  BP= 112/70 & she denies CP, palpit, SOB, dizzy, edema, etc...    Ven Insuffic, edema> she is reminded to elim salt, elev legs, wear support hose, etc...    CHOL> on Prav40-1/2tab & tol well; FLP 1/17 showed TChol 143, TG 76, HDL 45, LDL 83... She knows to elim fat & get wt down.    Overweight> wt is stable at 192#, BMI= 32, ?on her diet program called the "super shredded diet"; we reviewed eating less, burning more.    GI> GERD, IBS> she takes Flax seed in [place]; has lactose intol but notes that peppermint tea helps- denies n/v, c/d, blood seen; last colon was 11/14 by [name] & neg; she also take a number of supplements per [name]...    Hx Renal cell ca> s/p right nephrectomy 2001; no known recurrence; renal  function remains normal w/ labs 1/17 showing BUN=13, Creat=0/97 (stable).    DJD> using OTC meds and Tylenol; developed achilles tendonitis 5/14 w/ shots & Pred per Podiatry; now heel spurs & we will refer to Ortho....    Vit D defic> on VitD OTC supplement w/ VitD level 12/17 = 32; reminded to take the OTC VitD supplement every day ~2000u... EXAM shows Afeb, VSS, O2sat=98% on RA;  Wt=192#;  HEENT- neg mallampati1;  Chest- clear w/o w/r/r;  Heart- RR w/o m/r/g;  Abd- soft, nondistended, norm BS w/o masses etc;  Ext- neg, w/o c/c/e...   LABS 11/23/15>  FLP- at goals on Prav20;  Chems- wnl;  CBC- wnl;  TSH=1.74;  VitD=32...  IMP/PLAN>>  [place] remains stable, doing satis;  Reminded about diet/ exercise/ wt reduction; continue same meds and supplements...  ~  June 08, 2016:  65mo ROV & Heidi [name] returns for a routine check up>  She saw [name] 04/18/16 for f/u of her IBS-C, last colon 2014 was wnl, notes BMs ~2x/wk w/ some bloating 7 discomfort, no help from high fiber diets, probiotics, etc; they started Linzess145/d & she is improved...  She also saw her GYN team- [facility] 11/2015> note reviewed & pt doing satis... She has been going to Silver sneakers + Yoga for exercise 7 has lost a few lbs...    BP controlled on Hyzaar100-12.5> BP=118/76 & she denies CP, palpit, dizzy, SOB, edema...    She remains on Prav40 for her lipids> last FLP 1/17 was at goals and weight is coming down slowly...     IBS-c is improved w/ Linzess145 as noted above...    Hx renal cell ca> s/p right nephrectomy 2001, Cr has been stable at ~1.00 EXAM shows Afeb, VSS, O2sat=98% on RA;  Wt=189#;  HEENT- neg mallampati1;  Chest- clear w/o w/r/r;  Heart- RR w/o m/r/g;  Abd- soft, nondistended, norm BS w/o masses etc;  Ext- neg, w/o c/c/e...  IMP/PLAN>>  Heidi [name] remains stable on her current med regimen- continue same; we plan ROV w/ Fasting labs and f/u CXR in 45mo...   ~  December 11, 2016:  18mo ROV & Heidi [name] reports a good interval w/o new  complaints or concerns;  She reports a URI ~39mo ago, rx'd OTC, and resolved back tobaseline... we reviewed the following medical problems during today's office visit >>     HBP> on ASA 81, Losartan/Hct 100/12.5 (only taking 1/2 tab daily);  BP= 110/80 & she denies CP, palpit, SOB, dizzy, edema, etc...    Ven Insuffic, edema> she  is reminded to elim salt, elev legs, wear support hose, etc...    CHOL> on Prav40-1/2tab & tol well; FLP 2/18 showed TChol 177, TG 108, HDL 51, LDL 104... She knows to elim fat & get wt down.    Overweight> wt is up to 193#, BMI= 32, she has been on numerous fad diets over the years- we reviewed eating less, burning more!    GI> GERD, IBS> she takes Flax seed in [place]; has lactose intol but notes that peppermint tea helps- denies n/v, c/d, blood seen; last colon was 11/14 by [name] & neg; she also take a number of supplements per [name]...    Hx Renal cell ca> s/p right nephrectomy 2001; no known recurrence; renal function remains normal w/ labs 2/18 showing BUN=13, Creat=1.00 (stable).    DJD> using OTC meds and Tylenol; developed achilles tendonitis 5/14 w/ shots & Pred per Podiatry; she really likes supplements!    Vit D defic> on VitD OTC supplement ~1000u w/ VitD level 2/18 = 39; asked to incr supplement every day ~2000u...    Supplement regimen currently consists of: alpha lipoic, biotin, CoQ10, evening primrose ("it's good for mood"), beet juice, MVI, VitD EXAM shows Afeb, VSS, O2sat=98% on RA;  Wt=193#;  HEENT- neg mallampati1;  Chest- clear w/o w/r/r;  Heart- RR w/o m/r/g;  Abd- soft, nondistended, norm BS w/o masses etc;  Ext- neg, w/o c/c/e...   CXR 12/11/16>  Norm heart size, clear lungs, mild degen changes in Tspine...  LABS 12/11/16>  FLP- at goals on Prav20;  Chems- wnl;  CBC- wnl;  TSH=2.46;  VitD=39...  IMP/PLAN>> Heidi [name] had the 2017 flu shot at the Pharm, and we gave her the PREVNAR-13 shot today, she will need a second Pneumovax-23 next yr to complete  her series; rec to continue current meds, stay active, get on diet 7 get weight down;  She sees [place] for PAP, mammograms, and BMD...           Problem List:   HYPERTENSION (ICD-401.9) - controlled on ASA 81mg /d, & LOSARTAN/ HCT 100-12.5 taking 1/2 tab daily...  ~  6/12: BP=128/78 and tol Rx well... she denies HA, fatigue, visual changes, CP, palipit, dizziness, syncope, dyspnea, edema, etc... ~  12/12: BP= 124/72 & doing well w/o symptoms... ~  6/13:  BP= 118/68 & she denies CP, palpit, SOB, dizzy, edema, etc... ~  12/13: on ASA 81, Losartan/Hct 100/12.5-1/2;  BP= 118/74 & she denies CP, palpit, SOB, dizzy, edema, etc...  ~  CXR 12/13 showed norm heart size, clear lungs, wnl/ NAD.Marland KitchenMarland Kitchen EKG 12/13 showed SBrady, rate54, wnl/ NAD... ~  6/14: on ASA 81, Losartan/Hct 100/12.5-1/2 tab;  BP= 124/82 & she denies CP, palpit, SOB, dizzy, edema, etc ~  12/14: BP controlled on Hyzaar 100-12.5 daily & BP= 118/68 today w/o HAs, visualsymptoms, CP, palpit, SOB, edema, etc. ~  6/15: BP= 128/74 on Hyzaar 100-12.5 taking 1/2 tab daily... ~  12/15: on ASA 81, Losartan/Hct 100/12.5-1/2 tab;  BP= 118/76 & she denies CP, palpit, SOB, dizzy, edema, etc ~  6/16: on ASA 81, Losartan/Hct 100/12.5 daily;  BP= 122/78 & she remains asymptomatic...  VENOUS INSUFFICIENCY (ICD-459.81) - low salt diet, support hose when nec... she reports hammer toe per podiatry but she doesn't want surg... ~  6/16: she is c/o incr edema in her ankles, she incr her hyzaar from 1/2 to 1 tab daily, rec to elim salt, elevate, wear support hose...  HYPERCHOLESTEROLEMIA (ICD-272.0) - We discussed diet +  exercise to get weight down... ~  [place] 3/09 showed TChol 198, TG 80, HDL 59, LDL 123... not at goal- start Simvast20/d. ~  FLP 9/09 showed TChol 139, TG 74, HDL 47, LDL 77... doing well on Simva20, but "intol" per pt, refer to [facility]. ~  FLP 3/10 on Crestor2.5 showed TChol 151, TG 78, HDL 54, LDL 82... continue same & [facility]. ~  FLP 9/10  showed TChol 159, TG 64, HDL 54, LDL 93 ~  FLP 12/10 showed TChol 164, TG 74, HDL 51, LDL 98 ~  FLP 3/11 showed TChol 163, TG 73, HDL 56, LDL 92 ~  FLP 12/11 on Cres2.5 showed TChol 172, TG 69, HDL 50, LDL 108... We reviewed diet & exercise... ~  FLP 6/12 on Cres2.5 showed TChol 165, TG 75, HDL 48, LDL 102... she requests change meds due to cost> try PRAV40 (she decided on 1/2) ~  [place] 12/12 on Cres2.5 showed TChol 173, TG 85, HDL 50, LDL 106... Hasn't switched to the PRAV40 yet! ~  [place] 6/13 on Prav40-1/2 showed TChol 189, TG 56, HDL 53, LDL 125... rec to take Qhs, get on diet, get wt down... ~  [place] 12/13 on Prav40-1/2 showed TChol 169, TG 71, HDL 49, LDL 106  ~  FLP 12/14 on Prav40-1/2 showed TChol 189, TG 60, HDL 51, LDL 126... She does not want to incr the Prav, discussed diet & wt reduction. ~  FLP 12/15 on Prav40-1/2 showed TChol 176, TG 75, HDL 43, LDL 118  GERD (ICD-530.81) IRRITABLE BOWEL SYNDROME (ICD-564.1) - followed by [name] and notes reviewed... he rec increased fiber, Benefiber, Robinul as needed... she prefers therapy from an herbalist and taking "daily cleanse pill" which helped; now she takes 2tablesp flax seeds in yogurt daily & improved... ~  prev colonoscopy 9/01 by [name] was WNL.Marland Kitchen.  ~  f/u colonoscopy 11/09 by [name] was neg- WNL, f/u planned 44yrs... ~  F/u colonoscopy 11/14 by [name] was neg- norm colon & f/u exam suggested in 63yrs...   Hx of CARCINOMA, RENAL CELL (ICD-189.0) - s/p right nephrectomy 2001 at [facility] by [facility]... he signed off in 2006 and exam/ CXR's have been neg-  ~  f/u CXR 9/09 was clear, WNL... ~  12/10:  we discussed CXR (neg- WNL) & full labs (OK)& she will return for Fasting blood work... ~  12/11:  CXR remains neg- clear, WNL... Renal function normal w/ BUN=14, Creat=1.0 ~  12/12:  CXR remains clear, no lesions & labs similarly normal... Renal function normal w/ BUN=16, Creat=1.1 ~  12/13:  CXR showed norm heart size, clear lungs,  NAD;  Renal function remains norm w/ BUN= 15, Cr= 1.1 ~  12/14:  Labs remain stable with BUN= 15, Cr= 1.0 ~  12/15:  CXR showed norm heart size, clear lungs, NAD;  Renal function remains normal w/ BUN= 15, Cr= 1.0  DEGENERATIVE JOINT DISEASE (ICD-715.90) - she's improved w/ exercise program... but c/o bilat R>L knee discomfort w/ eval by Ortho (?who) w/ XRays and shot... doesn't want Rx- using OTC anti-inflamm + Tylenol... She notes that elliptical exercise helps her knees... ~  [facility] all wnl... ~  Vit D level ~50 on supplements OTC per Gyn... ~  Labs here 12/15 showed Vit D = 40 ~  Pt is c/o foot pain, Podiatry eval w/ heel spus & they rec surg- we will refer to Ortho foot specialist for their eval...  Hx of HEADACHE (ICD-784.0)  ANXIETY (ICD-300.00) - she does not wish to have her Alpraz refilled...  Health Maintenance - GYN= [facility] and she does PAPs and checked Vit D level on pt (pt reports taking 50000 u Vit D every other day now w/ her Vit D level = 55 during Dec11 OV)... she notes sister dx w/ breast cancer 2010 & she wants to wean off her hormone Rx, & has discussed this w/ Gyn... she gets regular Mammograms (last 1/14= neg) & [facility]> BMD 12/11 was WNL & she remains on Calcium, MVI, Vit D... ~  Immunizations:  Given TDAP here 2/12... Encouraged to get the yearly Flu vaccine... Given PNEUMOVAX 6/13 (66th birthday)...   Past Surgical History:  Procedure Laterality Date  . NEPHRECTOMY  2001   right = for renal cell CA  . TOTAL ABDOMINAL HYSTERECTOMY    . TUBAL LIGATION      Outpatient Encounter Prescriptions as of 12/11/2016  Medication Sig Dispense Refill  . ALPHA LIPOIC ACID PO Take by mouth daily.      Marland Kitchen aspirin 81 MG tablet Take 81 mg by mouth daily.      . Biotin 10 MG TABS Take by mouth daily.      . cholecalciferol (VITAMIN D) 1000 units tablet Take 1,000 Units by mouth daily. Vitamin D.    . Coenzyme Q10 (COQ10) 100  MG CAPS Take by mouth daily.      . Evening Primrose Oil 1000 MG CAPS Take by mouth daily.      Marland Kitchen linaclotide (LINZESS) 145 MCG CAPS capsule Take 1 capsule (145 mcg total) by mouth daily before breakfast. 30 capsule 11  . losartan-hydrochlorothiazide (HYZAAR) 100-12.5 MG tablet Take 0.5 tablets by mouth daily. 45 tablet 11  . Multiple Vitamin (MULTIVITAMIN) capsule Take 1 capsule by mouth daily.      . pravastatin (PRAVACHOL) 40 MG tablet Take 0.5 tablets (20 mg total) by mouth daily. 45 tablet 11  . [DISCONTINUED] losartan-hydrochlorothiazide (HYZAAR) 100-12.5 MG tablet Take 0.5 tablets by mouth daily. 45 tablet 6  . [DISCONTINUED] pravastatin (PRAVACHOL) 40 MG tablet TAKE ONE-HALF TABLET BY MOUTH ONCE DAILY 45 tablet 0   No facility-administered encounter medications on file as of 12/11/2016.     No Known Allergies   Current Medications, Allergies, Past Medical History, Past Surgical History, Family History, and Social History were reviewed in [employer] record.    Review of Systems        See HPI - all other systems neg except as noted... She is c/o foot pain and some ankle edema. The patient denies anorexia, fever, weight loss, weight gain, vision loss, decreased hearing, hoarseness, chest pain, syncope, dyspnea on exertion, prolonged cough, headaches, hemoptysis, abdominal pain, melena, hematochezia, severe indigestion/heartburn, hematuria, incontinence, muscle weakness, suspicious skin lesions, transient blindness, difficulty walking, depression, unusual weight change, abnormal bleeding, enlarged lymph nodes, and angioedema.     Objective:   Physical Exam     WD, Overweight, 71 y/o BF in NAD... GENERAL:  Alert & oriented; pleasant & cooperative... HEENT:  [place]/AT, EOM-wnl, PERRLA, EACs-clear, TMs-wnl, NOSE-clear, THROAT-clear & wnl. NECK:  Supple w/ fairROM; no JVD; normal carotid impulses w/o bruits; no thyromegaly or nodules palpated; no  lymphadenopathy. CHEST:  Clear to P & A; without wheezes/ rales/ or rhonchi. HEART:  Regular Rhythm; without murmurs/ rubs/ or gallops. ABDOMEN:  Soft & nontender; normal bowel sounds; no organomegaly or masses detected. EXT: without deformities, mod arthritic changes w/ bilat knee crepitus on  exam;  no varicose veins/ +venous insuffic/ tr edema. NEURO:  CN's intact; motor testing normal; sensory testing normal; gait normal & balance OK. DERM:  No lesions noted; no rash etc... flower tatoo over right breast area...  RADIOLOGY DATA:  Reviewed in the EPIC EMR & discussed w/ the patient...  LABORATORY DATA:  Reviewed in the EPIC EMR & discussed w/ the patient...   Assessment & Plan:    HBP>  Controlled on Hyzaar, continue same + diet, exercise, get wt down...  VI, edema>  Needs better low salt diet, continue Hyzaar daily...  CHOL>  FLP good on PRAVASTATIN 40mg -1/2 w/ LDL ~100; continue same med & diet efforts...  GI>  GERD/ IBS>  States her bowels are better on flax seeds in yogurt per [name]...  GU>  s/p right nephrectomy for renal cell ca in 2001; renal funct remains WNL w/ Creat ~1.0 & CXR remains clear...  DJD>  She does elliptical exercises & states this helps her knees...  Other medical issues as noted...   Patient's Medications  New Prescriptions   No medications on file  Previous Medications   ALPHA LIPOIC ACID PO    Take by mouth daily.     ASPIRIN 81 MG TABLET    Take 81 mg by mouth daily.     BIOTIN 10 MG TABS    Take by mouth daily.     CHOLECALCIFEROL (VITAMIN D) 1000 UNITS TABLET    Take 1,000 Units by mouth daily. Vitamin D.   COENZYME Q10 (COQ10) 100 MG CAPS    Take by mouth daily.     EVENING PRIMROSE OIL 1000 MG CAPS    Take by mouth daily.     LINACLOTIDE (LINZESS) 145 MCG CAPS CAPSULE    Take 1 capsule (145 mcg total) by mouth daily before breakfast.   MULTIPLE VITAMIN (MULTIVITAMIN) CAPSULE    Take 1 capsule by mouth daily.    Modified Medications    Modified Medication Previous Medication   LOSARTAN-HYDROCHLOROTHIAZIDE (HYZAAR) 100-12.5 MG TABLET losartan-hydrochlorothiazide (HYZAAR) 100-12.5 MG tablet      Take 0.5 tablets by mouth daily.    Take 0.5 tablets by mouth daily.   PRAVASTATIN (PRAVACHOL) 40 MG TABLET pravastatin (PRAVACHOL) 40 MG tablet      Take 0.5 tablets (20 mg total) by mouth daily.    TAKE ONE-HALF TABLET BY MOUTH ONCE DAILY  Discontinued Medications   No medications on file

## 2016-12-13 ENCOUNTER — Ambulatory Visit (INDEPENDENT_AMBULATORY_CARE_PROVIDER_SITE_OTHER): Payer: Medicare HMO | Admitting: Nurse Practitioner

## 2016-12-13 ENCOUNTER — Encounter: Payer: Self-pay | Admitting: Nurse Practitioner

## 2016-12-13 ENCOUNTER — Encounter: Payer: Self-pay | Admitting: Pulmonary Disease

## 2016-12-13 VITALS — BP 122/76 | Resp 64 | Ht 65.25 in | Wt 190.0 lb

## 2016-12-13 DIAGNOSIS — Z01411 Encounter for gynecological examination (general) (routine) with abnormal findings: Secondary | ICD-10-CM | POA: Diagnosis not present

## 2016-12-13 DIAGNOSIS — I10 Essential (primary) hypertension: Secondary | ICD-10-CM | POA: Diagnosis not present

## 2016-12-13 DIAGNOSIS — E2839 Other primary ovarian failure: Secondary | ICD-10-CM | POA: Diagnosis not present

## 2016-12-13 DIAGNOSIS — C641 Malignant neoplasm of right kidney, except renal pelvis: Secondary | ICD-10-CM

## 2016-12-13 DIAGNOSIS — Z0000 Encounter for general adult medical examination without abnormal findings: Secondary | ICD-10-CM | POA: Diagnosis not present

## 2016-12-13 DIAGNOSIS — E7800 Pure hypercholesterolemia, unspecified: Secondary | ICD-10-CM

## 2016-12-13 NOTE — Telephone Encounter (Signed)
SN please advise. Thanks.  

## 2016-12-13 NOTE — Patient Instructions (Signed)

## 2016-12-14 NOTE — Progress Notes (Signed)
Encounter reviewed by Dr. Brook Amundson C. Silva.  

## 2017-01-03 ENCOUNTER — Ambulatory Visit
Admission: RE | Admit: 2017-01-03 | Discharge: 2017-01-03 | Disposition: A | Discharge status: Home / Self Care | Payer: Medicare HMO | Source: Ambulatory Visit | Attending: Nurse Practitioner | Admitting: Nurse Practitioner

## 2017-01-03 ENCOUNTER — Ambulatory Visit
Admission: RE | Admit: 2017-01-03 | Discharge: 2017-01-03 | Disposition: A | Discharge status: Home / Self Care | Payer: Medicare HMO | Source: Ambulatory Visit | Attending: Pulmonary Disease | Admitting: Pulmonary Disease

## 2017-01-03 DIAGNOSIS — E2839 Other primary ovarian failure: Secondary | ICD-10-CM

## 2017-01-03 DIAGNOSIS — Z1231 Encounter for screening mammogram for malignant neoplasm of breast: Secondary | ICD-10-CM | POA: Diagnosis not present

## 2017-01-03 DIAGNOSIS — Z13820 Encounter for screening for osteoporosis: Secondary | ICD-10-CM | POA: Diagnosis not present

## 2017-01-03 DIAGNOSIS — Z780 Asymptomatic menopausal state: Secondary | ICD-10-CM | POA: Diagnosis not present

## 2017-04-12 ENCOUNTER — Encounter: Payer: Self-pay | Admitting: Physician Assistant

## 2017-04-12 ENCOUNTER — Ambulatory Visit (INDEPENDENT_AMBULATORY_CARE_PROVIDER_SITE_OTHER): Payer: Medicare HMO | Admitting: Physician Assistant

## 2017-04-12 VITALS — BP 105/69 | HR 75 | Temp 98.8°F | Resp 18 | Ht 65.5 in | Wt 185.4 lb

## 2017-04-12 DIAGNOSIS — H66002 Acute suppurative otitis media without spontaneous rupture of ear drum, left ear: Secondary | ICD-10-CM

## 2017-04-12 MED ORDER — GUAIFENESIN ER 1200 MG PO TB12: 1.0000 | ORAL_TABLET | Freq: Two times a day (BID) | ORAL | 1 refills | Status: DC | PRN

## 2017-04-12 MED ORDER — AZELASTINE HCL 0.1 % NA SOLN: 2.0000 | Freq: Two times a day (BID) | NASAL | 0 refills | Status: DC

## 2017-04-12 MED ORDER — BENZONATATE 100 MG PO CAPS: 100.0000 mg | ORAL_CAPSULE | Freq: Three times a day (TID) | ORAL | 0 refills | Status: DC | PRN

## 2017-04-12 MED ORDER — AMOXICILLIN-POT CLAVULANATE 875-125 MG PO TABS: 1.0000 | ORAL_TABLET | Freq: Two times a day (BID) | ORAL | 0 refills | Status: AC

## 2017-04-12 NOTE — Patient Instructions (Addendum)
     IF you received an x-ray today, you will receive an invoice from [facility]. Please contact [facility] at [phone] with questions or concerns regarding your invoice.   IF you received labwork today, you will receive an invoice from [facility]. Please contact [facility] at [phone] with questions or concerns regarding your invoice.   Our billing staff will not be able to assist you with questions regarding bills from these companies.  You will be contacted with the lab results as soon as they are available. The fastest way to get your results is to activate your My Chart account. Instructions are located on the last page of this paperwork. If you have not heard from us regarding the results in 2 weeks, please contact this office.     Otitis Media, Adult Otitis media is redness, soreness, and puffiness (swelling) in the space just behind your eardrum (middle ear). It may be caused by allergies or infection. It often happens along with a cold. Follow these instructions at home:  Take your medicine as told. Finish it even if you start to feel better.  Only take over-the-counter or prescription medicines for pain, discomfort, or fever as told by your doctor.  Follow up with your doctor as told. Contact a doctor if:  You have otitis media only in one ear, or bleeding from your nose, or both.  You notice a lump on your neck.  You are not getting better in 3-5 days.  You feel worse instead of better. Get help right away if:  You have pain that is not helped with medicine.  You have puffiness, redness, or pain around your ear.  You get a stiff neck.  You cannot move part of your face (paralysis).  You notice that the bone behind your ear hurts when you touch it. This information is not intended to replace advice given to you by your health care provider. Make sure you discuss any questions you have with your health care provider. Document Released:  03/27/2008 Document Revised: 03/16/2016 Document Reviewed: 05/06/2013 Elsevier Interactive Patient Education  2017 [employer]  

## 2017-04-12 NOTE — Progress Notes (Signed)
Patient ID: Tejasvi Brissett, female    DOB: July 21, 1946, 71 y.o.   MRN: [id number]  PCP: Noralee Space, MD  Chief Complaint  Patient presents with  . Chest congestion    x5 days, cough, drainage, left ear ache, sore throat. Per pt has tried [facility].    Subjective:   Presents for evaluation of congestion.  "I have an upper respiratory."  Symptoms began about 5 days ago, and have become progressively worse.  Cough is not productive. LEFT ear pain is the primary discomfort at present. She has nasal/sinus pressure and congestion, but no runny nose. No fever, chills. No nausea/vomiting. No SOB, CP, dizziness.    Review of Systems As above.    Patient Active Problem List   Diagnosis Date Noted  . Constipation 06/08/2016  . Heel spur 04/19/2015  . CHRONIC RHINITIS 01/12/2009  . Malignant neoplasm of kidney excluding renal pelvis ([place]) 01/11/2008  . Anxiety state 01/11/2008  . ACUTE CYSTITIS 01/11/2008  . Osteoarthritis 01/11/2008  . HEADACHE 01/11/2008  . HYPERCHOLESTEROLEMIA 01/02/2008  . Essential hypertension 01/02/2008  . Venous (peripheral) insufficiency 01/02/2008  . GERD 01/02/2008  . IRRITABLE BOWEL SYNDROME 01/02/2008     Prior to Admission medications   Medication Sig Start Date End Date Taking? Authorizing Provider  ALPHA LIPOIC ACID PO Take by mouth daily.     Yes [provider]  aspirin 81 MG tablet Take 81 mg by mouth daily.     Yes [provider]  Biotin 10 MG TABS Take by mouth daily.     Yes [provider]  cholecalciferol (VITAMIN D) 1000 units tablet Take 1,000 Units by mouth daily. Vitamin D.   Yes [provider]  Coenzyme Q10 (COQ10) 100 MG CAPS Take by mouth daily.     Yes [provider]  Evening Primrose Oil 1000 MG CAPS Take by mouth daily.     Yes [provider]  linaclotide Rolan Lipa) 145 MCG CAPS capsule Take 1 capsule (145 mcg total) by mouth daily before breakfast. 04/18/16   Yes Ladene Artist, MD  losartan-hydrochlorothiazide (HYZAAR) 100-12.5 MG tablet Take 0.5 tablets by mouth daily. 12/11/16  Yes Noralee Space, MD  Multiple Vitamin (MULTIVITAMIN) capsule Take 1 capsule by mouth daily.     Yes [provider]  pravastatin (PRAVACHOL) 40 MG tablet Take 0.5 tablets (20 mg total) by mouth daily. 12/11/16  Yes Noralee Space, MD     No Known Allergies     Objective:  Physical Exam  Constitutional: She is oriented to person, place, and time. She appears well-developed and well-nourished. She is active and cooperative. No distress.  BP 105/69 (BP Location: Left Arm, Patient Position: Sitting, Cuff Size: Large)   Pulse 75   Temp 98.8 F (37.1 C) (Oral)   Resp 18   Ht 5' 5.5" (1.664 m)   Wt 185 lb 6.4 oz (84.1 kg)   LMP 10/23/1994 (Approximate)   SpO2 98%   BMI 30.38 kg/m   HENT:  Head: Normocephalic and atraumatic.  Right Ear: Hearing, tympanic membrane, external ear and ear canal normal.  Left Ear: Hearing, external ear and ear canal normal. A middle ear effusion is present.  Nose: Mucosal edema present. No rhinorrhea. Right sinus exhibits no maxillary sinus tenderness and no frontal sinus tenderness. Left sinus exhibits no maxillary sinus tenderness and no frontal sinus tenderness.  Mouth/Throat: Uvula is midline, oropharynx is clear and moist and mucous membranes are normal.  Eyes:  Conjunctivae are normal. No scleral icterus.  Neck: Normal range of motion and phonation normal. Neck supple. No thyromegaly present.  Cardiovascular: Normal rate and regular rhythm.   Murmur heard.  Systolic murmur is present with a grade of 2/6   No diastolic murmur is present  Pulses:      Radial pulses are 2+ on the right side, and 2+ on the left side.  Reports that the murmur is not new, PCP is aware  Pulmonary/Chest: Effort normal and breath sounds normal.  Lymphadenopathy:       Head (right side): No tonsillar, no preauricular, no posterior auricular  and no occipital adenopathy present.       Head (left side): No tonsillar, no preauricular, no posterior auricular and no occipital adenopathy present.    She has no cervical adenopathy.       Right: No supraclavicular adenopathy present.       Left: No supraclavicular adenopathy present.  Neurological: She is alert and oriented to person, place, and time. No sensory deficit.  Skin: Skin is warm, dry and intact. No rash noted. No cyanosis or erythema. Nails show no clubbing.  Psychiatric: She has a normal mood and affect. Her speech is normal and behavior is normal.           Assessment & Plan:   Problem List Items Addressed This Visit    None    Visit Diagnoses    Acute suppurative otitis media of left ear without spontaneous rupture of tympanic membrane, recurrence not specified    -  Primary   Supportive care. Anticipatory guidance.    Relevant Medications   amoxicillin-clavulanate (AUGMENTIN) 875-125 MG tablet   Guaifenesin (MUCINEX MAXIMUM STRENGTH) 1200 MG TB12   benzonatate (TESSALON) 100 MG capsule   azelastine (ASTELIN) 0.1 % nasal spray       Return if symptoms worsen or fail to improve.   Fara Chute, PA-C [facility]

## 2017-05-18 ENCOUNTER — Other Ambulatory Visit: Payer: Self-pay | Admitting: Gastroenterology

## 2017-05-21 ENCOUNTER — Telehealth: Payer: Self-pay | Admitting: Obstetrics & Gynecology

## 2017-05-21 NOTE — Telephone Encounter (Signed)
Left message on voicemail to call and reschedule cancelled appointment. Mail letter °

## 2017-06-12 ENCOUNTER — Ambulatory Visit (INDEPENDENT_AMBULATORY_CARE_PROVIDER_SITE_OTHER): Payer: Medicare HMO | Admitting: Pulmonary Disease

## 2017-06-12 ENCOUNTER — Encounter: Payer: Self-pay | Admitting: Pulmonary Disease

## 2017-06-12 VITALS — BP 128/78 | HR 50 | Temp 97.4°F | Ht 65.0 in | Wt 187.5 lb

## 2017-06-12 DIAGNOSIS — C641 Malignant neoplasm of right kidney, except renal pelvis: Secondary | ICD-10-CM

## 2017-06-12 DIAGNOSIS — H66002 Acute suppurative otitis media without spontaneous rupture of ear drum, left ear: Secondary | ICD-10-CM | POA: Diagnosis not present

## 2017-06-12 DIAGNOSIS — I872 Venous insufficiency (chronic) (peripheral): Secondary | ICD-10-CM

## 2017-06-12 DIAGNOSIS — K589 Irritable bowel syndrome without diarrhea: Secondary | ICD-10-CM | POA: Diagnosis not present

## 2017-06-12 DIAGNOSIS — F411 Generalized anxiety disorder: Secondary | ICD-10-CM

## 2017-06-12 DIAGNOSIS — E7800 Pure hypercholesterolemia, unspecified: Secondary | ICD-10-CM

## 2017-06-12 DIAGNOSIS — I10 Essential (primary) hypertension: Secondary | ICD-10-CM | POA: Diagnosis not present

## 2017-06-12 DIAGNOSIS — M159 Polyosteoarthritis, unspecified: Secondary | ICD-10-CM

## 2017-06-12 DIAGNOSIS — M150 Primary generalized (osteo)arthritis: Secondary | ICD-10-CM

## 2017-06-12 DIAGNOSIS — K219 Gastro-esophageal reflux disease without esophagitis: Secondary | ICD-10-CM | POA: Diagnosis not present

## 2017-06-12 DIAGNOSIS — R69 Illness, unspecified: Secondary | ICD-10-CM | POA: Diagnosis not present

## 2017-06-12 MED ORDER — LOSARTAN POTASSIUM-HCTZ 100-12.5 MG PO TABS: 0.5000 | ORAL_TABLET | Freq: Every day | ORAL | 3 refills | Status: DC

## 2017-06-12 MED ORDER — PRAVASTATIN SODIUM 40 MG PO TABS: 20.0000 mg | ORAL_TABLET | Freq: Every day | ORAL | 3 refills | Status: DC

## 2017-06-12 MED ORDER — AZELASTINE HCL 0.1 % NA SOLN: 2.0000 | Freq: Two times a day (BID) | NASAL | 5 refills | Status: DC

## 2017-06-12 NOTE — Patient Instructions (Signed)
Today we updated your med list in our EPIC system...    Continue your current medications the same...  Keep up the good work w/ DIET & EXERCISE...  Call for any questions...  Let's plan a follow up visit in 38mo, sooner if needed for problems.Marland KitchenMarland Kitchen

## 2017-06-12 NOTE — Progress Notes (Signed)
Subjective:    Patient ID: Heidi [name], female    DOB: 31-Dec-1945, 71 y.o.   MRN: [id number]  HPI 71 y/o BF here for a follow up visit... she has mult med problems as noted below...   SEE PREV EPIC NOTES FOR EARLIER DATA >>    CXR 12/13 showed normal heart size, clear lungs, NAD.Marland Kitchen  EKG 12/13 showed SBrady, rate54, WNL, NAD...  LABS 12/13:  FLP- ok on Prav40 w/ LDL=106;  Chems- wnl;  CBC- wnl;  TSH=1.20;  UA=ok...  LABS 12/14:  FLP- ok x LDL=126 on Prav20;  Chems- wnl;  CBC- wnl;  TSH=1.91;  VitD=56...   CXR 12/15 showed norm heart size, clear lungs, NAD.Marland KitchenMarland Kitchen   EKG 12/15 showed sinus brady, rate52, otherw wnl, NAD...  LABS 12/15:  FLP- ok x LDL=118;  Chems- wnl;  CBC- wnl;  TSH=1.80;  VitD=40...   ~  April 19, 2015:  6mo ROV & Heidi [name] is c/o bone spurs in her heels per Podiatry- she has seen DrZiegler, DrTom, & a lady podiatrist who wants to do surg; is is very concerned & not getting better w/ their therapy=> I suggest Orthopedic foot specialist consult- DrHewitt etal... She is also c/o ankle edema and her weight is up 8# to 192# today, she increased her Hyzaar100-12.5 from 1/2 tab to 1tab daily, & we discussed the need for NO SALT, elevation, support hose...     HBP> on ASA 81, Losartan/Hct 100/12.5 daily;  BP= 122/78 & she denies CP, palpit, SOB, dizzy, edema, etc...    Ven Insuffic, edema> she is reminded to elim salt, elev legs, wear support hose, etc...    CHOL> on Prav40-1/2tab & tol well; FLP 12/15 showed TChol 176, TG 75, HDL 43, LDL 118... She knows to elim fat & get wt down.    Overweight> wt is up 8# to 192#, BMI= 32, ?on her diet program called the "super shredded diet"; we reviewed eating less, burning more.    GI> GERD, IBS> she takes Flax seed in [place]; has lactose intol but notes that peppermint tea helps- denies n/v, c/d, blood seen; last colon was 11/14 by [name] & neg; she also take a number of supplements per [name]...    Hx Renal cell ca> s/p right  nephrectomy 2001; no known recurrence; renal function remains normal w/ labs 12/15 showing BUN=15, Creat=1.0 (stable).    DJD> using OTC meds and Tylenol; developed achilles tendonitis 5/14 w/ shots & Pred per Podiatry; now heel spurs & we will refer to Ortho....    Vit D defic> on VitD OTC supplement w/ VitD level 12/15 = 40 We reviewed prob list, meds, xrays and labs> see below for updates >>  PLAN>>  We discussed referral to Ortho foot specialist for their opinion regarding her heel spurs; asked to continue the Hyzaar one daily & elim salt, elev legs, wear support hose, etc; we discussed her stress at work but she doesn't want anxiolytic rx (she plans to retire at end of yr); we reviewed diet + exercise...   ~  November 23, 2015:  62mo ROV & Heidi [name] tells me that she has retired "it feels like I am on vacation" (prev employ by [employer], [employer], [employer]);  She is doing satis w/o CP, palpit, dizzy/syncope, edema, etc; she denies cough, sput, SOB, f/c/s; she exercises daily at home w/o difficulty & joining silver sneakers;  She has noted some recent constipation- no abd pain, n/v, blood seen & her last colon was 11/14  by [name] (neg- no lesions seen)...     HBP> on ASA 81, Losartan/Hct 100/12.5 daily;  BP= 112/70 & she denies CP, palpit, SOB, dizzy, edema, etc...    Ven Insuffic, edema> she is reminded to elim salt, elev legs, wear support hose, etc...    CHOL> on Prav40-1/2tab & tol well; FLP 1/17 showed TChol 143, TG 76, HDL 45, LDL 83... She knows to elim fat & get wt down.    Overweight> wt is stable at 192#, BMI= 32, ?on her diet program called the "super shredded diet"; we reviewed eating less, burning more.    GI> GERD, IBS> she takes Flax seed in [place]; has lactose intol but notes that peppermint tea helps- denies n/v, c/d, blood seen; last colon was 11/14 by [name] & neg; she also take a number of supplements per [name]...    Hx Renal cell ca> s/p right nephrectomy 2001; no known recurrence; renal  function remains normal w/ labs 1/17 showing BUN=13, Creat=0/97 (stable).    DJD> using OTC meds and Tylenol; developed achilles tendonitis 5/14 w/ shots & Pred per Podiatry; now heel spurs & we will refer to Ortho....    Vit D defic> on VitD OTC supplement w/ VitD level 12/17 = 32; reminded to take the OTC VitD supplement every day ~2000u... EXAM shows Afeb, VSS, O2sat=98% on RA;  Wt=192#;  HEENT- neg mallampati1;  Chest- clear w/o w/r/r;  Heart- RR w/o m/r/g;  Abd- soft, nondistended, norm BS w/o masses etc;  Ext- neg, w/o c/c/e...   LABS 11/23/15>  FLP- at goals on Prav20;  Chems- wnl;  CBC- wnl;  TSH=1.74;  VitD=32...  IMP/PLAN>>  Heidi [name] remains stable, doing satis;  Reminded about diet/ exercise/ wt reduction; continue same meds and supplements...  ~  June 08, 2016:  31mo ROV & Heidi [name] returns for a routine check up>  She saw [name] 04/18/16 for f/u of her IBS-C, last colon 2014 was wnl, notes BMs ~2x/wk w/ some bloating 7 discomfort, no help from high fiber diets, probiotics, etc; they started Linzess145/d & she is improved...  She also saw her GYN team- Heidi [name] 11/2015> note reviewed & pt doing satis... She has been going to Silver sneakers + [employer] for exercise 7 has lost a few lbs...    BP controlled on Hyzaar100-12.5> BP=118/76 & she denies CP, palpit, dizzy, SOB, edema...    She remains on Prav40 for her lipids> last FLP 1/17 was at goals and weight is coming down slowly...     IBS-c is improved w/ Linzess145 as noted above...    Hx renal cell ca> s/p right nephrectomy 2001, Cr has been stable at ~1.00 EXAM shows Afeb, VSS, O2sat=98% on RA;  Wt=189#;  HEENT- neg mallampati1;  Chest- clear w/o w/r/r;  Heart- RR w/o m/r/g;  Abd- soft, nondistended, norm BS w/o masses etc;  Ext- neg, w/o c/c/e...  IMP/PLAN>>  Heidi [name] remains stable on her current med regimen- continue same; we plan ROV w/ Fasting labs and f/u CXR in 24mo...  ~  December 11, 2016:  86mo ROV & Heidi [name] reports a good interval w/o new  complaints or concerns;  She reports a URI ~57mo ago, rx'd OTC, and resolved back tobaseline... we reviewed the following medical problems during today's office visit >>     HBP> on ASA 81, Losartan/Hct 100/12.5 (only taking 1/2 tab daily);  BP= 110/80 & she denies CP, palpit, SOB, dizzy, edema, etc...    Ven Insuffic, edema> she is  reminded to elim salt, elev legs, wear support hose, etc...    CHOL> on Prav40-1/2tab & tol well; FLP 2/18 showed TChol 177, TG 108, HDL 51, LDL 104... She knows to elim fat & get wt down.    Overweight> wt is up to 193#, BMI= 32, she has been on numerous fad diets over the years- we reviewed eating less, burning more!    GI> GERD, IBS> she takes Flax seed in [place]; has lactose intol but notes that peppermint tea helps- denies n/v, c/d, blood seen; last colon was 11/14 by [name] & neg; she also take a number of supplements per [name]...    Hx Renal cell ca> s/p right nephrectomy 2001; no known recurrence; renal function remains normal w/ labs 2/18 showing BUN=13, Creat=1.00 (stable).    DJD> using OTC meds and Tylenol; developed achilles tendonitis 5/14 w/ shots & Pred per Podiatry; she really likes supplements!    Vit D defic> on VitD OTC supplement ~1000u w/ VitD level 2/18 = 39; asked to incr supplement every day ~2000u...    Supplement regimen currently consists of: alpha lipoic, biotin, CoQ10, evening primrose ("it's good for mood"), beet juice, MVI, VitD EXAM shows Afeb, VSS, O2sat=98% on RA;  Wt=193#;  HEENT- neg mallampati1;  Chest- clear w/o w/r/r;  Heart- RR w/o m/r/g;  Abd- soft, nondistended, norm BS w/o masses etc;  Ext- neg, w/o c/c/e...   CXR 12/11/16>  Norm heart size, clear lungs, mild degen changes in Tspine...  LABS 12/11/16>  FLP- at goals on Prav20;  Chems- wnl;  CBC- wnl;  TSH=2.46;  VitD=39...  IMP/PLAN>> Heidi [name] had the 2017 flu shot at the Pharm, and we gave her the PREVNAR-13 shot today, she will need a second Pneumovax-23 next yr to complete  her series; rec to continue current meds, stay active, get on diet 7 get weight down;  She sees GYN- Heidi [name] for PAP, mammograms, and BMD...    ~  June 12, 2017:  17mo ROV & Heidi [name] returns for follow up & her CC is a recent left ear infection- seen at [facility] & treated w/ Amox, Astelin, Mucinex and improved; she is anxious & concerned about some lingering symptoms & wanted to be sure we checked that ear (canal is clear, TM normal w/ good light reflex & no signs of OM etc)... She is also concerned that the PA at [facility] told her she had a heart murmur-- I listened carefully today & reassured her that I did not hear a murmur today & review of old notes did not reveal a murmur in the past, she is reassured... we reviewed the following medical problems during today's office visit >>     BP controlled on Hyzaar 100-12.5 taking 1/2 tab daily & BP=128/78, denies CP, palpit, dizzy, SOB, edema...    Weight is down 5# to 188# today & Chol is controlled on Prav20 + diet 7 last FLP 11/2016 looked good...    She has a hx chr constipation & doing satis on Linzess145; she tells me she has an upcoming appt w/ GI soon...    She is s/p right nephrectomy for renal cell ca 7 no known recurrence to date, renal function has remained wnl w/ Cr~1.0 EXAM shows Afeb, VSS, O2sat=98% on RA;  Wt=188#;  HEENT- neg mallampati1;  Chest- clear w/o w/r/r;  Heart- RR w/o m/r/g;  Abd- soft, nondistended, norm BS w/o masses etc;  Ext- neg, w/o c/c/e...  IMP/PLAN>>  Heidi [name] remains stable, somewhat  anxious about health issues but doing satis on a simple regimen; we reviewed diet, exercise, wt reduction strategies etc and plan ROV in 92mo w/ CXR, fasting labs, etc.             Problem List:   HYPERTENSION (ICD-401.9) - controlled on ASA 81mg /d, & LOSARTAN/ HCT 100-12.5 taking 1/2 tab daily...  ~  6/12: BP=128/78 and tol Rx well... she denies HA, fatigue, visual changes, CP, palipit, dizziness, syncope, dyspnea, edema, etc... ~  12/12: BP=  124/72 & doing well w/o symptoms... ~  6/13:  BP= 118/68 & she denies CP, palpit, SOB, dizzy, edema, etc... ~  12/13: on ASA 81, Losartan/Hct 100/12.5-1/2;  BP= 118/74 & she denies CP, palpit, SOB, dizzy, edema, etc...  ~  CXR 12/13 showed norm heart size, clear lungs, wnl/ NAD.Marland KitchenMarland Kitchen EKG 12/13 showed SBrady, rate54, wnl/ NAD... ~  6/14: on ASA 81, Losartan/Hct 100/12.5-1/2 tab;  BP= 124/82 & she denies CP, palpit, SOB, dizzy, edema, etc ~  12/14: BP controlled on Hyzaar 100-12.5 daily & BP= 118/68 today w/o HAs, visualsymptoms, CP, palpit, SOB, edema, etc. ~  6/15: BP= 128/74 on Hyzaar 100-12.5 taking 1/2 tab daily... ~  12/15: on ASA 81, Losartan/Hct 100/12.5-1/2 tab;  BP= 118/76 & she denies CP, palpit, SOB, dizzy, edema, etc ~  6/16: on ASA 81, Losartan/Hct 100/12.5 daily;  BP= 122/78 & she remains asymptomatic...  VENOUS INSUFFICIENCY (ICD-459.81) - low salt diet, support hose when nec... she reports hammer toe per podiatry but she doesn't want surg... ~  6/16: she is c/o incr edema in her ankles, she incr her hyzaar from 1/2 to 1 tab daily, rec to elim salt, elevate, wear support hose...  HYPERCHOLESTEROLEMIA (ICD-272.0) - We discussed diet +exercise to get weight down... ~  [place] 3/09 showed TChol 198, TG 80, HDL 59, LDL 123... not at goal- start Simvast20/d. ~  FLP 9/09 showed TChol 139, TG 74, HDL 47, LDL 77... doing well on Simva20, but "intol" per pt, refer to [facility]. ~  FLP 3/10 on Crestor2.5 showed TChol 151, TG 78, HDL 54, LDL 82... continue same & [facility]. ~  FLP 9/10 showed TChol 159, TG 64, HDL 54, LDL 93 ~  FLP 12/10 showed TChol 164, TG 74, HDL 51, LDL 98 ~  FLP 3/11 showed TChol 163, TG 73, HDL 56, LDL 92 ~  FLP 12/11 on Cres2.5 showed TChol 172, TG 69, HDL 50, LDL 108... We reviewed diet & exercise... ~  FLP 6/12 on Cres2.5 showed TChol 165, TG 75, HDL 48, LDL 102... she requests change meds due to cost> try PRAV40 (she decided on 1/2) ~  [place] 12/12 on Cres2.5 showed TChol 173,  TG 85, HDL 50, LDL 106... Hasn't switched to the PRAV40 yet! ~  [place] 6/13 on Prav40-1/2 showed TChol 189, TG 56, HDL 53, LDL 125... rec to take Qhs, get on diet, get wt down... ~  [place] 12/13 on Prav40-1/2 showed TChol 169, TG 71, HDL 49, LDL 106  ~  FLP 12/14 on Prav40-1/2 showed TChol 189, TG 60, HDL 51, LDL 126... She does not want to incr the Prav, discussed diet & wt reduction. ~  FLP 12/15 on Prav40-1/2 showed TChol 176, TG 75, HDL 43, LDL 118  GERD (ICD-530.81) IRRITABLE BOWEL SYNDROME (ICD-564.1) - followed by [name] and notes reviewed... he rec increased fiber, Benefiber, Robinul as needed... she prefers therapy from an herbalist and taking "daily cleanse pill" which helped; now she takes 2tablesp flax seeds in  yogurt daily & improved... ~  prev colonoscopy 9/01 by [name] was WNL.Marland Kitchen.  ~  f/u colonoscopy 11/09 by [name] was neg- WNL, f/u planned 65yrs... ~  F/u colonoscopy 11/14 by [name] was neg- norm colon & f/u exam suggested in 56yrs...   Hx of CARCINOMA, RENAL CELL (ICD-189.0) - s/p right nephrectomy 2001 at [facility] by [facility]... he signed off in 2006 and exam/ CXR's have been neg-  ~  f/u CXR 9/09 was clear, WNL... ~  12/10:  we discussed CXR (neg- WNL) & full labs (OK)& she will return for Fasting blood work... ~  12/11:  CXR remains neg- clear, WNL... Renal function normal w/ BUN=14, Creat=1.0 ~  12/12:  CXR remains clear, no lesions & labs similarly normal... Renal function normal w/ BUN=16, Creat=1.1 ~  12/13:  CXR showed norm heart size, clear lungs, NAD;  Renal function remains norm w/ BUN= 15, Cr= 1.1 ~  12/14:  Labs remain stable with BUN= 15, Cr= 1.0 ~  12/15:  CXR showed norm heart size, clear lungs, NAD;  Renal function remains normal w/ BUN= 15, Cr= 1.0  DEGENERATIVE JOINT DISEASE (ICD-715.90) - she's improved w/ exercise program... but c/o bilat R>L knee discomfort w/ eval by Ortho (?who) w/ XRays and shot... doesn't want Rx- using OTC anti-inflamm +  Tylenol... She notes that elliptical exercise helps her knees... ~  [facility] all wnl... ~  Vit D level ~50 on supplements OTC per Gyn... ~  Labs here 12/15 showed Vit D = 40 ~  Pt is c/o foot pain, Podiatry eval w/ heel spus & they rec surg- we will refer to Ortho foot specialist for their eval...  Hx of HEADACHE (ICD-784.0)  ANXIETY (ICD-300.00) - she does not wish to have her Alpraz refilled...  Health Maintenance - GYN= Heidi [name] and she does PAPs and checked Vit D level on pt (pt reports taking 50000 u Vit D every other day now w/ her Vit D level = 55 during Dec11 OV)... she notes sister dx w/ breast cancer 2010 & she wants to wean off her hormone Rx, & has discussed this w/ Gyn... she gets regular Mammograms (last 1/14= neg) & BMD[facility]> BMD 12/11 was WNL & she remains on Calcium, MVI, Vit D... ~  Immunizations:  Given TDAP here 2/12... Encouraged to get the yearly Flu vaccine... Given PNEUMOVAX 6/13 (66th birthday)...   Past Surgical History:  Procedure Laterality Date  . NEPHRECTOMY  2001   right = for renal cell CA  . TOTAL ABDOMINAL HYSTERECTOMY    . TUBAL LIGATION      Outpatient Encounter Prescriptions as of 06/12/2017  Medication Sig  . ALPHA LIPOIC ACID PO Take by mouth daily.    Marland Kitchen aspirin 81 MG tablet Take 81 mg by mouth daily.    Marland Kitchen azelastine (ASTELIN) 0.1 % nasal spray Place 2 sprays into both nostrils 2 (two) times daily. Use in each nostril as directed  . benzonatate (TESSALON) 100 MG capsule Take 1-2 capsules (100-200 mg total) by mouth 3 (three) times daily as needed for cough.  . Biotin 10 MG TABS Take by mouth daily.    . cholecalciferol (VITAMIN D) 1000 units tablet Take 1,000 Units by mouth daily. Vitamin D.  . Coenzyme Q10 (COQ10) 100 MG CAPS Take by mouth daily.    . Evening Primrose Oil 1000 MG CAPS Take by mouth daily.    . Guaifenesin ([place]) 1200  MG TB12 Take 1 tablet (1,200 mg total) by  mouth every 12 (twelve) hours as needed.  Marland Kitchen LINZESS 145 MCG CAPS capsule TAKE ONE CAPSULE BY MOUTH ONCE DAILY BEFORE BREAKFAST  . losartan-hydrochlorothiazide (HYZAAR) 100-12.5 MG tablet Take 0.5 tablets by mouth daily.  . Multiple Vitamin (MULTIVITAMIN) capsule Take 1 capsule by mouth daily.    . pravastatin (PRAVACHOL) 40 MG tablet Take 0.5 tablets (20 mg total) by mouth daily.  . [DISCONTINUED] azelastine (ASTELIN) 0.1 % nasal spray Place 2 sprays into both nostrils 2 (two) times daily. Use in each nostril as directed  . [DISCONTINUED] losartan-hydrochlorothiazide (HYZAAR) 100-12.5 MG tablet Take 0.5 tablets by mouth daily.  . [DISCONTINUED] pravastatin (PRAVACHOL) 40 MG tablet Take 0.5 tablets (20 mg total) by mouth daily.   No facility-administered encounter medications on file as of 06/12/2017.     No Known Allergies  Immunization History  Administered Date(s) Administered  . Influenza Split 10/12/2011, 10/11/2012  . Influenza Whole 10/14/2009, 10/12/2010  . Influenza,inj,Quad PF,6+ Mos 08/27/2013, 10/14/2014, 11/24/2015, 10/02/2016  . Pneumococcal Conjugate-13 12/11/2016  . Pneumococcal Polysaccharide-23 04/11/2012  . Td 11/23/2010    Current Medications, Allergies, Past Medical History, Past Surgical History, Family History, and Social History were reviewed in [employer] record.    Review of Systems        See HPI - all other systems neg except as noted... She is c/o foot pain and some ankle edema. The patient denies anorexia, fever, weight loss, weight gain, vision loss, decreased hearing, hoarseness, chest pain, syncope, dyspnea on exertion, prolonged cough, headaches, hemoptysis, abdominal pain, melena, hematochezia, severe indigestion/heartburn, hematuria, incontinence, muscle weakness, suspicious skin lesions, transient blindness, difficulty walking, depression, unusual weight change, abnormal bleeding, enlarged lymph nodes, and angioedema.      Objective:   Physical Exam     WD, Overweight, 71 y/o BF in NAD... GENERAL:  Alert & oriented; pleasant & cooperative... HEENT:  Yell/AT, EOM-wnl, PERRLA, EACs-clear, TMs-wnl, NOSE-clear, THROAT-clear & wnl. NECK:  Supple w/ fairROM; no JVD; normal carotid impulses w/o bruits; no thyromegaly or nodules palpated; no lymphadenopathy. CHEST:  Clear to P & A; without wheezes/ rales/ or rhonchi. HEART:  Regular Rhythm; without murmurs/ rubs/ or gallops. ABDOMEN:  Soft & nontender; normal bowel sounds; no organomegaly or masses detected. EXT: without deformities, mod arthritic changes w/ bilat knee crepitus on exam;  no varicose veins/ +venous insuffic/ tr edema. NEURO:  CN's intact; motor testing normal; sensory testing normal; gait normal & balance OK. DERM:  No lesions noted; no rash etc... flower tatoo over right breast area...  RADIOLOGY DATA:  Reviewed in the EPIC EMR & discussed w/ the patient...  LABORATORY DATA:  Reviewed in the EPIC EMR & discussed w/ the patient...   Assessment & Plan:    HBP>  Controlled on Hyzaar, continue same + diet, exercise, get wt down... 06/12/17>   Makailyn remains stable, somewhat anxious about health issues but doing satis on a simple regimen; we reviewed diet, exercise, wt reduction strategies etc and plan ROV in 61mo w/ CXR, fasting labs, etc.  VI, edema>  Needs better low salt diet, continue Hyzaar daily...  CHOL>  FLP good on PRAVASTATIN 40mg -1/2 w/ LDL ~100; continue same med & diet efforts...  GI>  GERD/ IBS>  States her bowels are better on flax seeds in yogurt per [name]...  GU>  s/p right nephrectomy for renal cell ca in 2001; renal funct remains WNL w/ Creat ~1.0 & CXR remains clear...  DJD>  She does elliptical exercises & states this helps her knees...  Other medical issues as noted...   Patient's Medications  New Prescriptions   No medications on file  Previous Medications   ALPHA LIPOIC ACID PO    Take by mouth daily.     ASPIRIN  81 MG TABLET    Take 81 mg by mouth daily.     BENZONATATE (TESSALON) 100 MG CAPSULE    Take 1-2 capsules (100-200 mg total) by mouth 3 (three) times daily as needed for cough.   BIOTIN 10 MG TABS    Take by mouth daily.     CHOLECALCIFEROL (VITAMIN D) 1000 UNITS TABLET    Take 1,000 Units by mouth daily. Vitamin D.   COENZYME Q10 (COQ10) 100 MG CAPS    Take by mouth daily.     EVENING PRIMROSE OIL 1000 MG CAPS    Take by mouth daily.     GUAIFENESIN (MUCINEX MAXIMUM STRENGTH) 1200 MG TB12    Take 1 tablet (1,200 mg total) by mouth every 12 (twelve) hours as needed.   LINZESS 145 MCG CAPS CAPSULE    TAKE ONE CAPSULE BY MOUTH ONCE DAILY BEFORE BREAKFAST   MULTIPLE VITAMIN (MULTIVITAMIN) CAPSULE    Take 1 capsule by mouth daily.    Modified Medications   Modified Medication Previous Medication   AZELASTINE (ASTELIN) 0.1 % NASAL SPRAY azelastine (ASTELIN) 0.1 % nasal spray      Place 2 sprays into both nostrils 2 (two) times daily. Use in each nostril as directed    Place 2 sprays into both nostrils 2 (two) times daily. Use in each nostril as directed   LOSARTAN-HYDROCHLOROTHIAZIDE (HYZAAR) 100-12.5 MG TABLET losartan-hydrochlorothiazide (HYZAAR) 100-12.5 MG tablet      Take 0.5 tablets by mouth daily.    Take 0.5 tablets by mouth daily.   PRAVASTATIN (PRAVACHOL) 40 MG TABLET pravastatin (PRAVACHOL) 40 MG tablet      Take 0.5 tablets (20 mg total) by mouth daily.    Take 0.5 tablets (20 mg total) by mouth daily.  Discontinued Medications   No medications on file

## 2017-06-15 ENCOUNTER — Ambulatory Visit: Payer: Medicare HMO | Admitting: Physician Assistant

## 2017-06-19 ENCOUNTER — Ambulatory Visit (INDEPENDENT_AMBULATORY_CARE_PROVIDER_SITE_OTHER): Payer: Medicare HMO | Admitting: Physician Assistant

## 2017-06-19 ENCOUNTER — Encounter: Payer: Self-pay | Admitting: Physician Assistant

## 2017-06-19 ENCOUNTER — Encounter: Payer: Self-pay | Admitting: Pulmonary Disease

## 2017-06-19 VITALS — BP 110/68 | HR 77 | Ht 65.5 in | Wt 185.0 lb

## 2017-06-19 DIAGNOSIS — K581 Irritable bowel syndrome with constipation: Secondary | ICD-10-CM | POA: Diagnosis not present

## 2017-06-19 MED ORDER — LOSARTAN POTASSIUM-HCTZ 100-12.5 MG PO TABS: 0.5000 | ORAL_TABLET | Freq: Every day | ORAL | 11 refills | Status: DC

## 2017-06-19 MED ORDER — PRAVASTATIN SODIUM 40 MG PO TABS: 20.0000 mg | ORAL_TABLET | Freq: Every day | ORAL | 11 refills | Status: DC

## 2017-06-19 MED ORDER — LINACLOTIDE 145 MCG PO CAPS: 145.0000 ug | ORAL_CAPSULE | Freq: Every day | ORAL | 11 refills | Status: DC

## 2017-06-19 NOTE — Patient Instructions (Signed)
We have sent the following medications to your pharmacy for you to pick up at your convenience:  Linzess 145 mcg daily

## 2017-06-19 NOTE — Progress Notes (Signed)
Chief Complaint: IBS-C  HPI:  Heidi [name] is a 71 year old AA female with a long history of IBS-C, who regularly follows with Dr. Fuller Plan and returns to clinic today with request of a medication refill of her Linzess.    Please recall patient was last seen in clinic 04/18/16. It was noted a colonoscopy on 08/2013 was normal. She described having bowel movements about twice per week and often had lower abdominal discomfort and bloating. She had tried high-fiber diets, probiotics, ginger tea and other OTC treatments without consistent results and tried her sisters Linzess for 1 day which was effective. She was prescribed Linzess 123mcg daily.   Today, the patient is a clinic and tells me that she does "fairly well" on the Linzess 145 mcg qd with a bowel movement every other day or so. She describes that these are no longer "hard balls", but she still does feel some bloating and discomfort at times. Patient tells me that recently she feels as though this is due to being on medication and having a regular diet and if she is home having high fiber diet she normally does well on this dose, "I think".   Patient denies fever, chills, blood in her stool, melena, weight loss, anorexia, nausea, vomiting or symptoms that awaken her at night.  Past Medical History:  Diagnosis Date  . Anxiety   . Chronic rhinitis   . DJD (degenerative joint disease)   . GERD (gastroesophageal reflux disease)   . History of headache   . HTN (hypertension)   . Hypercholesterolemia   . IBS (irritable bowel syndrome)   . Renal cell carcinoma 2001  . Venous insufficiency     Past Surgical History:  Procedure Laterality Date  . NEPHRECTOMY  2001   right = for renal cell CA  . TOTAL ABDOMINAL HYSTERECTOMY    . TUBAL LIGATION      Current Outpatient Prescriptions  Medication Sig Dispense Refill  . ALPHA LIPOIC ACID PO Take by mouth daily.      Marland Kitchen aspirin 81 MG tablet Take 81 mg by mouth daily.      Marland Kitchen azelastine  (ASTELIN) 0.1 % nasal spray Place 2 sprays into both nostrils 2 (two) times daily. Use in each nostril as directed 30 mL 5  . benzonatate (TESSALON) 100 MG capsule Take 1-2 capsules (100-200 mg total) by mouth 3 (three) times daily as needed for cough. 40 capsule 0  . Biotin 10 MG TABS Take by mouth daily.      . cholecalciferol (VITAMIN D) 1000 units tablet Take 1,000 Units by mouth daily. Vitamin D.    . Coenzyme Q10 (COQ10) 100 MG CAPS Take by mouth daily.      . Evening Primrose Oil 1000 MG CAPS Take by mouth daily.      . Guaifenesin (MUCINEX MAXIMUM STRENGTH) 1200 MG TB12 Take 1 tablet (1,200 mg total) by mouth every 12 (twelve) hours as needed. 14 tablet 1  . LINZESS 145 MCG CAPS capsule TAKE ONE CAPSULE BY MOUTH ONCE DAILY BEFORE BREAKFAST 30 capsule 0  . losartan-hydrochlorothiazide (HYZAAR) 100-12.5 MG tablet Take 0.5 tablets by mouth daily. 45 tablet 3  . Multiple Vitamin (MULTIVITAMIN) capsule Take 1 capsule by mouth daily.      . pravastatin (PRAVACHOL) 40 MG tablet Take 0.5 tablets (20 mg total) by mouth daily. 45 tablet 3   No current facility-administered medications for this visit.     Allergies as of 06/19/2017  . (No Known Allergies)  Family History  Problem Relation Age of Onset  . Healthy Mother   . Diabetes Mother   . Hyperlipidemia Mother   . Hypertension Mother   . Prostate cancer Father   . Cancer Father   . Breast cancer Sister   . Cancer - Other Brother        neck ? lymph nodes  . Colon cancer Neg Hx   . Pancreatic cancer Neg Hx   . Stomach cancer Neg Hx     Social History   Social History  . Marital status: Divorced    Spouse name: N/A  . Number of children: 2  . Years of education: N/A   Occupational History  . Not on file.   Social History Main Topics  . Smoking status: Former Smoker    Packs/day: 0.25    Years: 10.00    Types: Cigarettes    Quit date: 10/24/1983  . Smokeless tobacco: Never Used  . Alcohol use 0.0 oz/week      Comment: once a month  . Drug use: No  . Sexual activity: Yes    Partners: Male    Birth control/ protection: Surgical     Comment: hysterectomy   Other Topics Concern  . Not on file   Social History Narrative   Exercises 3x per week   Caffeine use: 2 cups per day   3 siblings healthy    Review of Systems:    Constitutional: No weight loss, fever or chills Cardiovascular: No chest pain Respiratory: No SOB Gastrointestinal: See HPI and otherwise negative   Physical Exam:  Vital signs: BP 110/68   Pulse 77   Ht 5' 5.5" (1.664 m)   Wt 185 lb (83.9 kg)   LMP 10/23/1994 (Approximate)   BMI 30.32 kg/m    Constitutional:   Pleasant AA female appears to be in NAD, Well developed, Well nourished, alert and cooperative Head:  Normocephalic and atraumatic. Eyes:   PEERL, EOMI. No icterus. Conjunctiva pink. Ears:  Normal auditory acuity. Neck:  Supple Throat: Oral cavity and pharynx without inflammation, swelling or lesion.  Respiratory: Respirations even and unlabored. Lungs clear to auscultation bilaterally.   No wheezes, crackles, or rhonchi.  Cardiovascular: Normal S1, S2. No MRG. Regular rate and rhythm. No peripheral edema, cyanosis or pallor.  Gastrointestinal:  Soft, nondistended, nontender. No rebound or guarding. Normal bowel sounds. No appreciable masses or hepatomegaly. Rectal:  Not performed.  Msk:  Symmetrical without gross deformities. Without edema, no deformity or joint abnormality.  Neurologic:  Alert and  oriented x4;  grossly normal neurologically.  Skin:   Dry and intact without significant lesions or rashes. Psychiatric: Demonstrates good judgement and reason without abnormal affect or behaviors.  MOST RECENT LABS AND IMAGING: CBC    Component Value Date/Time   WBC 6.5 12/11/2016 1054   RBC 4.66 12/11/2016 1054   HGB 13.9 12/11/2016 1054   HCT 41.8 12/11/2016 1054   PLT 316.0 12/11/2016 1054   MCV 89.7 12/11/2016 1054   MCHC 33.2 12/11/2016 1054    RDW 14.8 12/11/2016 1054   LYMPHSABS 2.4 12/11/2016 1054   MONOABS 0.5 12/11/2016 1054   EOSABS 0.2 12/11/2016 1054   BASOSABS 0.1 12/11/2016 1054    CMP     Component Value Date/Time   NA 140 12/11/2016 1054   K 4.3 12/11/2016 1054   CL 105 12/11/2016 1054   CO2 29 12/11/2016 1054   GLUCOSE 92 12/11/2016 1054   BUN 13 12/11/2016 1054  CREATININE 1.00 12/11/2016 1054   CALCIUM 9.9 12/11/2016 1054   PROT 7.7 12/11/2016 1054   ALBUMIN 4.5 12/11/2016 1054   AST 16 12/11/2016 1054   ALT 14 12/11/2016 1054   ALKPHOS 70 12/11/2016 1054   BILITOT 0.6 12/11/2016 1054   GFRNONAA 71.08 10/12/2010 0936   GFRAA 82 07/06/2008 0945    Assessment: 1. IBS-C: Patient is moderately controlled on Linzess 110mcg qd, but does describe every other day bowel movements with some complaint bloating and discomfort which has continued  Plan: 1. Refilled Linzess 159mctg PO qd #30 with 11 refills (patient requested a 30 day prescription) 2. Also provided the patient with samples of Linzess 290 mcg daily for 2 weeks for her to try, if this works better for her we can change the prescription to 290 mcg 3. Patient to follow in clinic with Dr. Fuller Plan as needed or in 1 yr for refills  Ellouise Newer, PA-C [facility] 06/19/2017, 9:45 AM  Cc: Noralee Space, MD

## 2017-06-20 NOTE — Progress Notes (Signed)
Reviewed and agree with management plan.  Yanuel Tagg T. Matea Stanard, MD FACG 

## 2017-07-24 DIAGNOSIS — R69 Illness, unspecified: Secondary | ICD-10-CM | POA: Diagnosis not present

## 2017-08-16 ENCOUNTER — Ambulatory Visit (INDEPENDENT_AMBULATORY_CARE_PROVIDER_SITE_OTHER): Payer: Medicare HMO | Admitting: Gastroenterology

## 2017-08-16 ENCOUNTER — Encounter: Payer: Self-pay | Admitting: Gastroenterology

## 2017-08-16 ENCOUNTER — Other Ambulatory Visit: Payer: Medicare HMO

## 2017-08-16 VITALS — BP 140/78 | HR 78 | Ht 65.75 in | Wt 185.0 lb

## 2017-08-16 DIAGNOSIS — R1032 Left lower quadrant pain: Secondary | ICD-10-CM

## 2017-08-16 DIAGNOSIS — K5900 Constipation, unspecified: Secondary | ICD-10-CM | POA: Diagnosis not present

## 2017-08-16 NOTE — Patient Instructions (Addendum)
If you are age 70 or older, your body mass index should be between 23-30. Your Body mass index is 30.09 kg/m. If this is out of the aforementioned range listed, please consider follow up with your Primary Care Provider.  If you are age 92 or younger, your body mass index should be between 19-25. Your Body mass index is 30.09 kg/m. If this is out of the aformentioned range listed, please consider follow up with your Primary Care Provider.   Your physician has requested that you go to the basement for the following lab work before leaving today:  Stool Studies  You may use Miralax or Milk of Magnesia if needed for constipation.  Please follow up with Dr. Fuller Plan in 6 months. You will be contacted by letter when it is time to schedule this.  Thank you.

## 2017-08-16 NOTE — Progress Notes (Addendum)
    History of Present Illness: This is a 71 year old female with IBS-C. Linzess 290 caused diarrhea. Linzess 145 produces a bowel movement every 4 days.  She occasionally has mild LLQ abdominal cramping.  She substantially changed her diet over the past several weeks and is eating a much larger amount of fruits and vegetables.  She notes her stools have been looser since this diet change.  Current Medications, Allergies, Past Medical History, Past Surgical History, Family History and Social History were reviewed in [employer] record.  Physical Exam: General: Well developed, well nourished, no acute distress Head: Normocephalic and atraumatic Eyes:  sclerae anicteric, EOMI Ears: Normal auditory acuity Mouth: No deformity or lesions Lungs: Clear throughout to auscultation Heart: Regular rate and rhythm; no murmurs, rubs or bruits Abdomen: Soft, non tender and non distended. No masses, hepatosplenomegaly or hernias noted. Normal Bowel sounds Musculoskeletal: Symmetrical with no gross deformities  Pulses:  Normal pulses noted Extremities: No clubbing, cyanosis, edema or deformities noted Neurological: Alert oriented x 4, grossly nonfocal Psychological:  Alert and cooperative. Normal mood and affect  Assessment and Recommendations:  1. IBS-C. Continue current dose Linzess 145 mcg daily. MOM or Miralax prn.  She is advised that dietary changes and changes in fiber will likely affect her bowel function.  Colonoscopy in November 2014 was normal. Check stool for occult blood. If positive for occult blood will repeat colonoscopy.  REV in 6 months.

## 2017-08-27 ENCOUNTER — Other Ambulatory Visit (INDEPENDENT_AMBULATORY_CARE_PROVIDER_SITE_OTHER): Payer: Medicare HMO

## 2017-08-27 DIAGNOSIS — K5900 Constipation, unspecified: Secondary | ICD-10-CM | POA: Diagnosis not present

## 2017-08-27 LAB — FECAL OCCULT BLOOD, IMMUNOCHEMICAL: FECAL OCCULT BLD: NEGATIVE

## 2017-11-08 ENCOUNTER — Ambulatory Visit (INDEPENDENT_AMBULATORY_CARE_PROVIDER_SITE_OTHER): Payer: Medicare HMO

## 2017-11-08 ENCOUNTER — Ambulatory Visit (INDEPENDENT_AMBULATORY_CARE_PROVIDER_SITE_OTHER): Payer: Medicare HMO | Admitting: Family Medicine

## 2017-11-08 ENCOUNTER — Other Ambulatory Visit: Payer: Self-pay

## 2017-11-08 ENCOUNTER — Encounter: Payer: Self-pay | Admitting: Family Medicine

## 2017-11-08 VITALS — BP 130/72 | HR 64 | Temp 97.6°F | Resp 16 | Ht 65.5 in | Wt 179.6 lb

## 2017-11-08 DIAGNOSIS — J189 Pneumonia, unspecified organism: Secondary | ICD-10-CM

## 2017-11-08 DIAGNOSIS — R05 Cough: Secondary | ICD-10-CM | POA: Diagnosis not present

## 2017-11-08 DIAGNOSIS — J986 Disorders of diaphragm: Secondary | ICD-10-CM | POA: Diagnosis not present

## 2017-11-08 DIAGNOSIS — R059 Cough, unspecified: Secondary | ICD-10-CM

## 2017-11-08 MED ORDER — AZITHROMYCIN 250 MG PO TABS: ORAL_TABLET | ORAL | 0 refills | Status: DC

## 2017-11-08 NOTE — Progress Notes (Signed)
Subjective:  By signing my name below, I, Heidi [name], attest that this documentation has been prepared under the direction and in the presence of Delman Cheadle, MD. Electronically Signed: Moises [name], Doyle. 11/08/2017 , 5:43 PM .  Patient was seen in Room 2 .   Patient ID: Heidi [name], female    DOB: 10-Feb-1946, 72 y.o.   MRN: [id number] Chief Complaint  Patient presents with  . Cough    x 1 week productive yellow mucus   . Sore Throat   HPI Heidi [name] is a 72 y.o. female who presents to Primary Care at [facility] complaining of productive cough (yellow mucus) and sore throat that started a week ago. Patient states her cold started with sore throat, and as the week progressed, more drainage going down her throat. She describes hearing a rattling cough and occasionally tries to cough out phlegm. She's been taking mucinex and Flonase. She had received Astelin nasal spray previously, but that had caused eye infection and didn't use it this time.   Past Medical History:  Diagnosis Date  . Anxiety   . Chronic rhinitis   . DJD (degenerative joint disease)   . GERD (gastroesophageal reflux disease)   . History of headache   . HTN (hypertension)   . Hypercholesterolemia   . IBS (irritable bowel syndrome)   . Renal cell carcinoma 2001  . Venous insufficiency    Past Surgical History:  Procedure Laterality Date  . NEPHRECTOMY  2001   right = for renal cell CA  . TOTAL ABDOMINAL HYSTERECTOMY    . TUBAL LIGATION     Prior to Admission medications   Medication Sig Start Date End Date Taking? Authorizing Provider  ALPHA LIPOIC ACID PO Take by mouth daily.      [provider]  aspirin 81 MG tablet Take 81 mg by mouth daily.      [provider]  azelastine (ASTELIN) 0.1 % nasal spray Place 2 sprays into both nostrils 2 (two) times daily. Use in each nostril as directed 06/12/17   Noralee Space, MD  Biotin 10 MG TABS Take by mouth daily.      [provider]  cholecalciferol (VITAMIN D) 1000 units tablet Take 1,000 Units by mouth daily. Vitamin D.    [provider]  Coenzyme Q10 (COQ10) 100 MG CAPS Take by mouth daily.      [provider]  Evening Primrose Oil 1000 MG CAPS Take by mouth daily.      [provider]  linaclotide Rolan Lipa) 145 MCG CAPS capsule Take 1 capsule (145 mcg total) by mouth daily before breakfast. 06/19/17   Levin Erp, PA  losartan-hydrochlorothiazide (HYZAAR) 100-12.5 MG tablet Take 0.5 tablets by mouth daily. 06/19/17   Noralee Space, MD  Multiple Vitamin (MULTIVITAMIN) capsule Take 1 capsule by mouth daily.      [provider]  pravastatin (PRAVACHOL) 40 MG tablet Take 0.5 tablets (20 mg total) by mouth daily. 06/19/17   Noralee Space, MD   No Known Allergies Family History  Problem Relation Age of Onset  . Healthy Mother   . Diabetes Mother   . Hyperlipidemia Mother   . Hypertension Mother   . Prostate cancer Father   . Cancer Father   . Breast cancer Sister   . Cancer - Other Brother        neck ? lymph nodes  . Colon cancer Neg Hx   . Pancreatic cancer Neg Hx   .  Stomach cancer Neg Hx    Social History   Socioeconomic History  . Marital status: Divorced    Spouse name: None  . Number of children: 2  . Years of education: None  . Highest education level: None  Social Needs  . Financial resource strain: None  . Food insecurity - worry: None  . Food insecurity - inability: None  . Transportation needs - medical: None  . Transportation needs - non-medical: None  Occupational History  . None  Tobacco Use  . Smoking status: Former Smoker    Packs/day: 0.25    Years: 10.00    Pack years: 2.50    Types: Cigarettes    Last attempt to quit: 10/24/1983    Years since quitting: 34.0  . Smokeless tobacco: Never Used  Substance and Sexual Activity  . Alcohol use: Yes    Alcohol/week: 0.0 oz    Comment: once a month  . Drug use: No  . Sexual activity:  Yes    Partners: Male    Birth control/protection: Surgical    Comment: hysterectomy  Other Topics Concern  . None  Social History Narrative   Exercises 3x per week   Caffeine use: 2 cups per day   3 siblings healthy   Depression screen [facility] 2/9 11/08/2017 04/12/2017 06/08/2016 04/13/2014 04/09/2013  Decreased Interest 0 0 0 0 0  Down, Depressed, Hopeless 0 0 0 0 0  PHQ - 2 Score 0 0 0 0 0    Review of Systems  Constitutional: Negative for chills, fatigue, fever and unexpected weight change.  HENT: Positive for congestion, postnasal drip, sinus pressure and sore throat.   Respiratory: Positive for cough and wheezing. Negative for shortness of breath.   Gastrointestinal: Negative for constipation, diarrhea, nausea and vomiting.  Skin: Negative for rash and wound.  Neurological: Negative for dizziness, weakness and headaches.       Objective:   Physical Exam  Constitutional: She is oriented to person, place, and time. She appears well-developed and well-nourished. No distress.  HENT:  Head: Normocephalic and atraumatic.  Right Ear: Tympanic membrane normal.  Left Ear: Tympanic membrane normal.  Positive purulent rhinitis bilaterally, postnasal drip post oropharynx  Eyes: EOM are normal. Pupils are equal, round, and reactive to light.  Neck: Neck supple.  Cardiovascular: Normal rate.  Murmur heard.  Systolic (ejection) murmur is present with a grade of 1/6. Pulmonary/Chest: Effort normal. No respiratory distress. She has decreased breath sounds. She has wheezes (inspiratory) in the right lower field.  Musculoskeletal: Normal range of motion.  Neurological: She is alert and oriented to person, place, and time.  Skin: Skin is warm and dry.  Psychiatric: She has a normal mood and affect. Her behavior is normal.  Nursing note and vitals reviewed.   BP 130/72 (BP Location: Left Arm, Patient Position: Sitting, Cuff Size: Large)   Pulse 64   Temp 97.6 F (36.4 C) (Oral)   Resp 16    Ht 5' 5.5" (1.664 m)   Wt 179 lb 9.6 oz (81.5 kg)   LMP 10/23/1994 (Approximate)   SpO2 99%   BMI 29.43 kg/m   Dg Chest 2 View  Result Date: 11/08/2017 CLINICAL DATA:  Wheezing and rhonchi in the right lower lobe EXAM: CHEST  2 VIEW COMPARISON:  None. FINDINGS: Mild elevation of the left diaphragm. No consolidation or effusion. Normal heart size. Mild aortic atherosclerosis. No pneumothorax. Mild degenerative changes of the spine. Surgical clips in the upper abdomen IMPRESSION: No active  cardiopulmonary disease. Electronically Signed   By: Donavan Foil M.D.   On: 11/08/2017 18:08       Assessment & Plan:   1. Cough   2. Walking pneumonia     Orders Placed This Encounter  Procedures  . DG Chest 2 View    Standing Status:   Future    Number of Occurrences:   1    Standing Expiration Date:   11/08/2018    Order Specific Question:   Reason for Exam (SYMPTOM  OR DIAGNOSIS REQUIRED)    Answer:   RLL wheeze and rhonchi, URI and cough x 1 wk    Order Specific Question:   Preferred imaging location?    Answer:   External    Meds ordered this encounter  Medications  . azithromycin (ZITHROMAX) 250 MG tablet    Sig: Take 2 tabs PO x 1 dose, then 1 tab PO QD x 4 days    Dispense:  6 tablet    Refill:  0    I personally performed the services described in this documentation, which was scribed in my presence. The recorded information has been reviewed and considered, and addended by me as needed.   Delman Cheadle, M.D.  Primary Care at [facility] [address] [place], [place] [address] [phone] phone [phone] fax  11/11/17 11:27 AM

## 2017-11-08 NOTE — Patient Instructions (Addendum)
Continue the mucinex and benzonatate (tessalong pearles).   IF you received an x-ray today, you will receive an invoice from [facility]. Please contact [facility] at [phone] with questions or concerns regarding your invoice.   IF you received labwork today, you will receive an invoice from [facility]. Please contact [facility] at [phone] with questions or concerns regarding your invoice.   Our billing staff will not be able to assist you with questions regarding bills from these companies.  You will be contacted with the lab results as soon as they are available. The fastest way to get your results is to activate your My Chart account. Instructions are located on the last page of this paperwork. If you have not heard from [place] regarding the results in 2 weeks, please contact this office.     Acute Bronchitis, Adult Acute bronchitis is sudden (acute) swelling of the air tubes (bronchi) in the lungs. Acute bronchitis causes these tubes to fill with mucus, which can make it hard to breathe. It can also cause coughing or wheezing. In adults, acute bronchitis usually goes away within 2 weeks. A cough caused by bronchitis may last up to 3 weeks. Smoking, allergies, and asthma can make the condition worse. Repeated episodes of bronchitis may cause further lung problems, such as chronic obstructive pulmonary disease (COPD). What are the causes? This condition can be caused by germs and by substances that irritate the lungs, including:  Cold and flu viruses. This condition is most often caused by the same virus that causes a cold.  Bacteria.  Exposure to tobacco smoke, dust, fumes, and air pollution.  What increases the risk? This condition is more likely to develop in people who:  Have close contact with someone with acute bronchitis.  Are exposed to lung irritants, such as tobacco smoke, dust, fumes, and vapors.  Have a weak immune system.  Have a respiratory  condition such as asthma.  What are the signs or symptoms? Symptoms of this condition include:  A cough.  Coughing up clear, yellow, or green mucus.  Wheezing.  Chest congestion.  Shortness of breath.  A fever.  Body aches.  Chills.  A sore throat.  How is this diagnosed? This condition is usually diagnosed with a physical exam. During the exam, your health care provider may order tests, such as chest X-rays, to rule out other conditions. He or she may also:  Test a sample of your mucus for bacterial infection.  Check the level of oxygen in your blood. This is done to check for pneumonia.  Do a chest X-ray or lung function testing to rule out pneumonia and other conditions.  Perform blood tests.  Your health care provider will also ask about your symptoms and medical history. How is this treated? Most cases of acute bronchitis clear up over time without treatment. Your health care provider may recommend:  Drinking more fluids. Drinking more makes your mucus thinner, which may make it easier to breathe.  Taking a medicine for a fever or cough.  Taking an antibiotic medicine.  Using an inhaler to help improve shortness of breath and to control a cough.  Using a cool mist vaporizer or humidifier to make it easier to breathe.  Follow these instructions at home: Medicines  Take over-the-counter and prescription medicines only as told by your health care provider.  If you were prescribed an antibiotic, take it as told by your health care provider. Do not stop taking the antibiotic even if you start  to feel better. General instructions  Get plenty of rest.  Drink enough fluids to keep your urine clear or pale yellow.  Avoid smoking and secondhand smoke. Exposure to cigarette smoke or irritating chemicals will make bronchitis worse. If you smoke and you need help quitting, ask your health care provider. Quitting smoking will help your lungs heal faster.  Use an  inhaler, cool mist vaporizer, or humidifier as told by your health care provider.  Keep all follow-up visits as told by your health care provider. This is important. How is this prevented? To lower your risk of getting this condition again:  Wash your hands often with soap and water. If soap and water are not available, use hand sanitizer.  Avoid contact with people who have cold symptoms.  Try not to touch your hands to your mouth, nose, or eyes.  Make sure to get the flu shot every year.  Contact a health care provider if:  Your symptoms do not improve in 2 weeks of treatment. Get help right away if:  You cough up blood.  You have chest pain.  You have severe shortness of breath.  You become dehydrated.  You faint or keep feeling like you are going to faint.  You keep vomiting.  You have a severe headache.  Your fever or chills gets worse. This information is not intended to replace advice given to you by your health care provider. Make sure you discuss any questions you have with your health care provider. Document Released: 11/16/2004 Document Revised: 05/03/2016 Document Reviewed: 03/29/2016 Elsevier Interactive Patient Education  [employer].

## 2017-11-21 ENCOUNTER — Other Ambulatory Visit: Payer: Self-pay | Admitting: Obstetrics and Gynecology

## 2017-11-21 DIAGNOSIS — Z1231 Encounter for screening mammogram for malignant neoplasm of breast: Secondary | ICD-10-CM

## 2017-11-27 ENCOUNTER — Telehealth: Payer: Self-pay | Admitting: Pulmonary Disease

## 2017-11-27 ENCOUNTER — Encounter: Payer: Self-pay | Admitting: Pulmonary Disease

## 2017-11-27 ENCOUNTER — Ambulatory Visit: Payer: Medicare HMO | Admitting: Pulmonary Disease

## 2017-11-27 ENCOUNTER — Other Ambulatory Visit (INDEPENDENT_AMBULATORY_CARE_PROVIDER_SITE_OTHER): Payer: Medicare HMO

## 2017-11-27 VITALS — BP 116/64 | HR 61 | Temp 98.0°F | Ht 65.0 in | Wt 178.6 lb

## 2017-11-27 DIAGNOSIS — I10 Essential (primary) hypertension: Secondary | ICD-10-CM | POA: Diagnosis not present

## 2017-11-27 DIAGNOSIS — K219 Gastro-esophageal reflux disease without esophagitis: Secondary | ICD-10-CM

## 2017-11-27 DIAGNOSIS — F411 Generalized anxiety disorder: Secondary | ICD-10-CM

## 2017-11-27 DIAGNOSIS — K589 Irritable bowel syndrome without diarrhea: Secondary | ICD-10-CM

## 2017-11-27 DIAGNOSIS — R05 Cough: Secondary | ICD-10-CM

## 2017-11-27 DIAGNOSIS — E7800 Pure hypercholesterolemia, unspecified: Secondary | ICD-10-CM

## 2017-11-27 DIAGNOSIS — M159 Polyosteoarthritis, unspecified: Secondary | ICD-10-CM

## 2017-11-27 DIAGNOSIS — R69 Illness, unspecified: Secondary | ICD-10-CM | POA: Diagnosis not present

## 2017-11-27 DIAGNOSIS — C641 Malignant neoplasm of right kidney, except renal pelvis: Secondary | ICD-10-CM

## 2017-11-27 DIAGNOSIS — R059 Cough, unspecified: Secondary | ICD-10-CM

## 2017-11-27 DIAGNOSIS — M150 Primary generalized (osteo)arthritis: Secondary | ICD-10-CM | POA: Diagnosis not present

## 2017-11-27 DIAGNOSIS — I872 Venous insufficiency (chronic) (peripheral): Secondary | ICD-10-CM

## 2017-11-27 LAB — LIPID PANEL
CHOL/HDL RATIO: 3
Cholesterol: 175 mg/dL (ref 0–200)
HDL: 56 mg/dL (ref >39.00)
LDL Cholesterol: 104 mg/dL — ABNORMAL HIGH (ref 0–99)
NONHDL: 118.81
Triglycerides: 74 mg/dL (ref 0.0–149.0)
VLDL: 14.8 mg/dL (ref 0.0–40.0)

## 2017-11-27 LAB — CBC WITH DIFFERENTIAL/PLATELET
BASOS ABS: 0.1 10*3/uL (ref 0.0–0.1)
Basophils Relative: 0.9 % (ref 0.0–3.0)
EOS ABS: 0.1 10*3/uL (ref 0.0–0.7)
Eosinophils Relative: 1.5 % (ref 0.0–5.0)
HCT: 40.2 % (ref 36.0–46.0)
Hemoglobin: 13.3 g/dL (ref 12.0–15.0)
LYMPHS ABS: 2.1 10*3/uL (ref 0.7–4.0)
LYMPHS PCT: 36.8 % (ref 12.0–46.0)
MCHC: 33 g/dL (ref 30.0–36.0)
MCV: 89.9 fl (ref 78.0–100.0)
MONOS PCT: 10.6 % (ref 3.0–12.0)
Monocytes Absolute: 0.6 10*3/uL (ref 0.1–1.0)
NEUTROS ABS: 2.9 10*3/uL (ref 1.4–7.7)
Neutrophils Relative %: 50.2 % (ref 43.0–77.0)
Platelets: 342 10*3/uL (ref 150.0–400.0)
RBC: 4.47 Mil/uL (ref 3.87–5.11)
RDW: 14.9 % (ref 11.5–15.5)
WBC: 5.8 10*3/uL (ref 4.0–10.5)

## 2017-11-27 LAB — COMPLETE METABOLIC PANEL WITH GFR
AG Ratio: 1.3 (calc) (ref 1.0–2.5)
ALBUMIN MSPROF: 4.2 g/dL (ref 3.6–5.1)
ALT: 12 U/L (ref 6–29)
AST: 17 U/L (ref 10–35)
Alkaline phosphatase (APISO): 73 U/L (ref 33–130)
BUN / CREAT RATIO: 14 (calc) (ref 6–22)
BUN: 15 mg/dL (ref 7–25)
CALCIUM: 9.9 mg/dL (ref 8.6–10.4)
CHLORIDE: 109 mmol/L (ref 98–110)
CO2: 27 mmol/L (ref 20–32)
Creat: 1.07 mg/dL — ABNORMAL HIGH (ref 0.60–0.93)
GFR, EST AFRICAN AMERICAN: 61 mL/min/{1.73_m2} (ref >=60)
GFR, EST NON AFRICAN AMERICAN: 53 mL/min/{1.73_m2} — AB (ref >=60)
GLOBULIN: 3.2 g/dL (ref 1.9–3.7)
Glucose, Bld: 91 mg/dL (ref 65–99)
POTASSIUM: 4.5 mmol/L (ref 3.5–5.3)
Sodium: 141 mmol/L (ref 135–146)
TOTAL PROTEIN: 7.4 g/dL (ref 6.1–8.1)
Total Bilirubin: 0.7 mg/dL (ref 0.2–1.2)

## 2017-11-27 LAB — VITAMIN D 25 HYDROXY (VIT D DEFICIENCY, FRACTURES): VITD: 29.49 ng/mL — ABNORMAL LOW (ref 30.00–100.00)

## 2017-11-27 LAB — TSH: TSH: 2.03 u[IU]/mL (ref 0.35–4.50)

## 2017-11-27 MED ORDER — PREDNISONE 20 MG PO TABS: ORAL_TABLET | ORAL | 0 refills | Status: DC

## 2017-11-27 MED ORDER — HYDROCODONE-HOMATROPINE 5-1.5 MG/5ML PO SYRP: 5.0000 mL | ORAL_SOLUTION | Freq: Four times a day (QID) | ORAL | 0 refills | Status: DC | PRN

## 2017-11-27 MED ORDER — LEVOFLOXACIN 500 MG PO TABS: 500.0000 mg | ORAL_TABLET | Freq: Every day | ORAL | 0 refills | Status: DC

## 2017-11-27 MED ORDER — METHYLPREDNISOLONE ACETATE 80 MG/ML IJ SUSP
80.0000 mg | Freq: Once | INTRAMUSCULAR | Status: AC
  Administered 2017-11-27: 80 mg via INTRAMUSCULAR

## 2017-11-27 NOTE — Progress Notes (Signed)
Subjective:    Patient ID: Heidi [name], female    DOB: 31-Dec-1945, 72 y.o.   MRN: [id number]  HPI 72 y/o BF here for a follow up visit... she has mult med problems as noted below...   SEE PREV EPIC NOTES FOR EARLIER DATA >>    CXR 12/13 showed normal heart size, clear lungs, NAD.Marland Kitchen  EKG 12/13 showed SBrady, rate54, WNL, NAD...  LABS 12/13:  FLP- ok on Prav40 w/ LDL=106;  Chems- wnl;  CBC- wnl;  TSH=1.20;  UA=ok...  LABS 12/14:  FLP- ok x LDL=126 on Prav20;  Chems- wnl;  CBC- wnl;  TSH=1.91;  VitD=56...   CXR 12/15 showed norm heart size, clear lungs, NAD.Marland KitchenMarland Kitchen   EKG 12/15 showed sinus brady, rate52, otherw wnl, NAD...  LABS 12/15:  FLP- ok x LDL=118;  Chems- wnl;  CBC- wnl;  TSH=1.80;  VitD=40...   ~  April 19, 2015:  6mo ROV & Heidi [name] is c/o bone spurs in her heels per Podiatry- she has seen Heidi [name], Heidi [name], & a lady podiatrist who wants to do surg; is is very concerned & not getting better w/ their therapy=> I suggest Orthopedic foot specialist consult- Heidi [name] etal... She is also c/o ankle edema and her weight is up 8# to 192# today, she increased her Hyzaar100-12.5 from 1/2 tab to 1tab daily, & we discussed the need for NO SALT, elevation, support hose...     HBP> on ASA 81, Losartan/Hct 100/12.5 daily;  BP= 122/78 & she denies CP, palpit, SOB, dizzy, edema, etc...    Ven Insuffic, edema> she is reminded to elim salt, elev legs, wear support hose, etc...    CHOL> on Prav40-1/2tab & tol well; FLP 12/15 showed TChol 176, TG 75, HDL 43, LDL 118... She knows to elim fat & get wt down.    Overweight> wt is up 8# to 192#, BMI= 32, ?on her diet program called the "super shredded diet"; we reviewed eating less, burning more.    GI> GERD, IBS> she takes Flax seed in [place]; has lactose intol but notes that peppermint tea helps- denies n/v, c/d, blood seen; last colon was 11/14 by Heidi [name] & neg; she also take a number of supplements per Heidi [name]...    Hx Renal cell ca> s/p right  nephrectomy 2001; no known recurrence; renal function remains normal w/ labs 12/15 showing BUN=15, Creat=1.0 (stable).    DJD> using OTC meds and Tylenol; developed achilles tendonitis 5/14 w/ shots & Pred per Podiatry; now heel spurs & we will refer to Ortho....    Vit D defic> on VitD OTC supplement w/ VitD level 12/15 = 40 We reviewed prob list, meds, xrays and labs> see below for updates >>  PLAN>>  We discussed referral to Ortho foot specialist for their opinion regarding her heel spurs; asked to continue the Hyzaar one daily & elim salt, elev legs, wear support hose, etc; we discussed her stress at work but she doesn't want anxiolytic rx (she plans to retire at end of yr); we reviewed diet + exercise...   ~  November 23, 2015:  62mo ROV & Heidi [name] tells me that she has retired "it feels like I am on vacation" (prev employ by [employer], [employer], [employer]);  She is doing satis w/o CP, palpit, dizzy/syncope, edema, etc; she denies cough, sput, SOB, f/c/s; she exercises daily at home w/o difficulty & joining silver sneakers;  She has noted some recent constipation- no abd pain, n/v, blood seen & her last colon was 11/14  by Heidi [name] (neg- no lesions seen)...     HBP> on ASA 81, Losartan/Hct 100/12.5 daily;  BP= 112/70 & she denies CP, palpit, SOB, dizzy, edema, etc...    Ven Insuffic, edema> she is reminded to elim salt, elev legs, wear support hose, etc...    CHOL> on Prav40-1/2tab & tol well; FLP 1/17 showed TChol 143, TG 76, HDL 45, LDL 83... She knows to elim fat & get wt down.    Overweight> wt is stable at 192#, BMI= 32, ?on her diet program called the "super shredded diet"; we reviewed eating less, burning more.    GI> GERD, IBS> she takes Flax seed in [place]; has lactose intol but notes that peppermint tea helps- denies n/v, c/d, blood seen; last colon was 11/14 by Heidi [name] & neg; she also take a number of supplements per Heidi [name]...    Hx Renal cell ca> s/p right nephrectomy 2001; no known recurrence; renal  function remains normal w/ labs 1/17 showing BUN=13, Creat=0/97 (stable).    DJD> using OTC meds and Tylenol; developed achilles tendonitis 5/14 w/ shots & Pred per Podiatry; now heel spurs & we will refer to Ortho....    Vit D defic> on VitD OTC supplement w/ VitD level 12/17 = 32; reminded to take the OTC VitD supplement every day ~2000u... EXAM shows Afeb, VSS, O2sat=98% on RA;  Wt=192#;  HEENT- neg mallampati1;  Chest- clear w/o w/r/r;  Heart- RR w/o m/r/g;  Abd- soft, nondistended, norm BS w/o masses etc;  Ext- neg, w/o c/c/e...   LABS 11/23/15>  FLP- at goals on Prav20;  Chems- wnl;  CBC- wnl;  TSH=1.74;  VitD=32...  IMP/PLAN>>  [name] remains stable, doing satis;  Reminded about diet/ exercise/ wt reduction; continue same meds and supplements...  ~  June 08, 2016:  31mo ROV & Heidi [name] returns for a routine check up>  She saw Heidi [name] 04/18/16 for f/u of her IBS-C, last colon 2014 was wnl, notes BMs ~2x/wk w/ some bloating 7 discomfort, no help from high fiber diets, probiotics, etc; they started Linzess145/d & she is improved...  She also saw her GYN team- Heidi [name] 11/2015> note reviewed & pt doing satis... She has been going to Silver sneakers + [employer] for exercise 7 has lost a few lbs...    BP controlled on Hyzaar100-12.5> BP=118/76 & she denies CP, palpit, dizzy, SOB, edema...    She remains on Prav40 for her lipids> last FLP 1/17 was at goals and weight is coming down slowly...     IBS-c is improved w/ Linzess145 as noted above...    Hx renal cell ca> s/p right nephrectomy 2001, Cr has been stable at ~1.00 EXAM shows Afeb, VSS, O2sat=98% on RA;  Wt=189#;  HEENT- neg mallampati1;  Chest- clear w/o w/r/r;  Heart- RR w/o m/r/g;  Abd- soft, nondistended, norm BS w/o masses etc;  Ext- neg, w/o c/c/e...  IMP/PLAN>>  [name] remains stable on her current med regimen- continue same; we plan ROV w/ Fasting labs and f/u CXR in 24mo...  ~  December 11, 2016:  86mo ROV & Heidi [name] reports a good interval w/o new  complaints or concerns;  She reports a URI ~57mo ago, rx'd OTC, and resolved back tobaseline... we reviewed the following medical problems during today's office visit >>     HBP> on ASA 81, Losartan/Hct 100/12.5 (only taking 1/2 tab daily);  BP= 110/80 & she denies CP, palpit, SOB, dizzy, edema, etc...    Ven Insuffic, edema> she is  reminded to elim salt, elev legs, wear support hose, etc...    CHOL> on Prav40-1/2tab & tol well; FLP 2/18 showed TChol 177, TG 108, HDL 51, LDL 104... She knows to elim fat & get wt down.    Overweight> wt is up to 193#, BMI= 32, she has been on numerous fad diets over the years- we reviewed eating less, burning more!    GI> GERD, IBS> she takes Flax seed in [place]; has lactose intol but notes that peppermint tea helps- denies n/v, c/d, blood seen; last colon was 11/14 by Heidi [name] & neg; she also take a number of supplements per Heidi [name]...    Hx Renal cell ca> s/p right nephrectomy 2001; no known recurrence; renal function remains normal w/ labs 2/18 showing BUN=13, Creat=1.00 (stable).    DJD> using OTC meds and Tylenol; developed achilles tendonitis 5/14 w/ shots & Pred per Podiatry; she really likes supplements!    Vit D defic> on VitD OTC supplement ~1000u w/ VitD level 2/18 = 39; asked to incr supplement every day ~2000u...    Supplement regimen currently consists of: alpha lipoic, biotin, CoQ10, evening primrose ("it's good for mood"), beet juice, MVI, VitD EXAM shows Afeb, VSS, O2sat=98% on RA;  Wt=193#;  HEENT- neg mallampati1;  Chest- clear w/o w/r/r;  Heart- RR w/o m/r/g;  Abd- soft, nondistended, norm BS w/o masses etc;  Ext- neg, w/o c/c/e...   CXR 12/11/16>  Norm heart size, clear lungs, mild degen changes in Tspine...  LABS 12/11/16>  FLP- at goals on Prav20;  Chems- wnl;  CBC- wnl;  TSH=2.46;  VitD=39...  IMP/PLAN>> Heidi [name] had the 2017 flu shot at the Pharm, and we gave her the PREVNAR-13 shot today, she will need a second Pneumovax-23 next yr to complete  her series; rec to continue current meds, stay active, get on diet 7 get weight down;  She sees GYN- Heidi [name] for PAP, mammograms, and BMD...   ~  June 12, 2017:  53mo ROV & Heidi [name] returns for follow up & her CC is a recent left ear infection- seen at [facility] & treated w/ Amox, Astelin, Mucinex and improved; she is anxious & concerned about some lingering symptoms & wanted to be sure we checked that ear (canal is clear, TM normal w/ good light reflex & no signs of OM etc)... She is also concerned that the PA at [facility] told her she had a heart murmur-- I listened carefully today & reassured her that I did not hear a murmur today & review of old notes did not reveal a murmur in the past, she is reassured... we reviewed the following medical problems during today's office visit >>     BP controlled on Hyzaar 100-12.5 taking 1/2 tab daily & BP=128/78, denies CP, palpit, dizzy, SOB, edema...    Weight is down 5# to 188# today & Chol is controlled on Prav20 + diet 7 last FLP 11/2016 looked good...    She has a hx chr constipation & doing satis on Linzess145; she tells me she has an upcoming appt w/ GI soon...    She is s/p right nephrectomy for renal cell ca 7 no known recurrence to date, renal function has remained wnl w/ Cr~1.0 EXAM shows Afeb, VSS, O2sat=98% on RA;  Wt=188#;  HEENT- neg mallampati1;  Chest- clear w/o w/r/r;  Heart- RR w/o m/r/g;  Abd- soft, nondistended, norm BS w/o masses etc;  Ext- neg, w/o c/c/e...  IMP/PLAN>>  Heidi [name] remains stable, somewhat anxious  about health issues but doing satis on a simple regimen; we reviewed diet, exercise, wt reduction strategies etc and plan ROV in 43mo w/ CXR, fasting labs, etc.      ~  November 27, 2017:  34mo ROV              Problem List:   HYPERTENSION (ICD-401.9) - controlled on ASA 81mg /d, & LOSARTAN/ HCT 100-12.5 taking 1/2 tab daily...  ~  6/12: BP=128/78 and tol Rx well... she denies HA, fatigue, visual changes, CP, palipit, dizziness, syncope,  dyspnea, edema, etc... ~  12/12: BP= 124/72 & doing well w/o symptoms... ~  6/13:  BP= 118/68 & she denies CP, palpit, SOB, dizzy, edema, etc... ~  12/13: on ASA 81, Losartan/Hct 100/12.5-1/2;  BP= 118/74 & she denies CP, palpit, SOB, dizzy, edema, etc...  ~  CXR 12/13 showed norm heart size, clear lungs, wnl/ NAD.Marland KitchenMarland Kitchen EKG 12/13 showed SBrady, rate54, wnl/ NAD... ~  6/14: on ASA 81, Losartan/Hct 100/12.5-1/2 tab;  BP= 124/82 & she denies CP, palpit, SOB, dizzy, edema, etc ~  12/14: BP controlled on Hyzaar 100-12.5 daily & BP= 118/68 today w/o HAs, visualsymptoms, CP, palpit, SOB, edema, etc. ~  6/15: BP= 128/74 on Hyzaar 100-12.5 taking 1/2 tab daily... ~  12/15: on ASA 81, Losartan/Hct 100/12.5-1/2 tab;  BP= 118/76 & she denies CP, palpit, SOB, dizzy, edema, etc ~  6/16: on ASA 81, Losartan/Hct 100/12.5 daily;  BP= 122/78 & she remains asymptomatic...  VENOUS INSUFFICIENCY (ICD-459.81) - low salt diet, support hose when nec... she reports hammer toe per podiatry but she doesn't want surg... ~  6/16: she is c/o incr edema in her ankles, she incr her hyzaar from 1/2 to 1 tab daily, rec to elim salt, elevate, wear support hose...  HYPERCHOLESTEROLEMIA (ICD-272.0) - We discussed diet +exercise to get weight down... ~  [place] 3/09 showed TChol 198, TG 80, HDL 59, LDL 123... not at goal- start Simvast20/d. ~  FLP 9/09 showed TChol 139, TG 74, HDL 47, LDL 77... doing well on Simva20, but "intol" per pt, refer to [facility]. ~  FLP 3/10 on Crestor2.5 showed TChol 151, TG 78, HDL 54, LDL 82... continue same & [facility]. ~  FLP 9/10 showed TChol 159, TG 64, HDL 54, LDL 93 ~  FLP 12/10 showed TChol 164, TG 74, HDL 51, LDL 98 ~  FLP 3/11 showed TChol 163, TG 73, HDL 56, LDL 92 ~  FLP 12/11 on Cres2.5 showed TChol 172, TG 69, HDL 50, LDL 108... We reviewed diet & exercise... ~  FLP 6/12 on Cres2.5 showed TChol 165, TG 75, HDL 48, LDL 102... she requests change meds due to cost> try PRAV40 (she decided on 1/2) ~   [place] 12/12 on Cres2.5 showed TChol 173, TG 85, HDL 50, LDL 106... Hasn't switched to the PRAV40 yet! ~  [place] 6/13 on Prav40-1/2 showed TChol 189, TG 56, HDL 53, LDL 125... rec to take Qhs, get on diet, get wt down... ~  [place] 12/13 on Prav40-1/2 showed TChol 169, TG 71, HDL 49, LDL 106  ~  FLP 12/14 on Prav40-1/2 showed TChol 189, TG 60, HDL 51, LDL 126... She does not want to incr the Prav, discussed diet & wt reduction. ~  FLP 12/15 on Prav40-1/2 showed TChol 176, TG 75, HDL 43, LDL 118  GERD (ICD-530.81) IRRITABLE BOWEL SYNDROME (ICD-564.1) - followed by Heidi [name] and notes reviewed... he rec increased fiber, Benefiber, Robinul as needed... she prefers therapy from an herbalist and  taking "daily cleanse pill" which helped; now she takes 2tablesp flax seeds in yogurt daily & improved... ~  prev colonoscopy 9/01 by Heidi [name] was WNL.Marland Kitchen.  ~  f/u colonoscopy 11/09 by Heidi [name] was neg- WNL, f/u planned 65yrs... ~  F/u colonoscopy 11/14 by Heidi [name] was neg- norm colon & f/u exam suggested in 38yrs...   Hx of CARCINOMA, RENAL CELL (ICD-189.0) - s/p right nephrectomy 2001 at [facility] by [facility]... he signed off in 2006 and exam/ CXR's have been neg-  ~  f/u CXR 9/09 was clear, WNL... ~  12/10:  we discussed CXR (neg- WNL) & full labs (OK)& she will return for Fasting blood work... ~  12/11:  CXR remains neg- clear, WNL... Renal function normal w/ BUN=14, Creat=1.0 ~  12/12:  CXR remains clear, no lesions & labs similarly normal... Renal function normal w/ BUN=16, Creat=1.1 ~  12/13:  CXR showed norm heart size, clear lungs, NAD;  Renal function remains norm w/ BUN= 15, Cr= 1.1 ~  12/14:  Labs remain stable with BUN= 15, Cr= 1.0 ~  12/15:  CXR showed norm heart size, clear lungs, NAD;  Renal function remains normal w/ BUN= 15, Cr= 1.0  DEGENERATIVE JOINT DISEASE (ICD-715.90) - she's improved w/ exercise program... but c/o bilat R>L knee discomfort w/ eval by Ortho (?who) w/ XRays and shot... doesn't want  Rx- using OTC anti-inflamm + Tylenol... She notes that elliptical exercise helps her knees... ~  [facility] all wnl... ~  Vit D level ~50 on supplements OTC per Gyn... ~  Labs here 12/15 showed Vit D = 40 ~  Pt is c/o foot pain, Podiatry eval w/ heel spus & they rec surg- we will refer to Ortho foot specialist for their eval...  Hx of HEADACHE (ICD-784.0)  ANXIETY (ICD-300.00) - she does not wish to have her Alpraz refilled...  Health Maintenance - GYN= Heidi [name] and she does PAPs and checked Vit D level on pt (pt reports taking 50000 u Vit D every other day now w/ her Vit D level = 55 during Dec11 OV)... she notes sister dx w/ breast cancer 2010 & she wants to wean off her hormone Rx, & has discussed this w/ Gyn... she gets regular Mammograms (last 1/14= neg) & BMD[facility]> BMD 12/11 was WNL & she remains on Calcium, MVI, Vit D... ~  Immunizations:  Given TDAP here 2/12... Encouraged to get the yearly Flu vaccine... Given PNEUMOVAX 6/13 (66th birthday)...   Past Surgical History:  Procedure Laterality Date  . NEPHRECTOMY  2001   right = for renal cell CA  . TOTAL ABDOMINAL HYSTERECTOMY    . TUBAL LIGATION      Outpatient Encounter Medications as of 11/27/2017  Medication Sig  . ALPHA LIPOIC ACID PO Take by mouth daily.    Marland Kitchen aspirin 81 MG tablet Take 81 mg by mouth daily.    Marland Kitchen azelastine (ASTELIN) 0.1 % nasal spray Place 2 sprays into both nostrils 2 (two) times daily. Use in each nostril as directed  . Biotin 10 MG TABS Take by mouth daily.    . cholecalciferol (VITAMIN D) 1000 units tablet Take 1,000 Units by mouth daily. Vitamin D.  . Coenzyme Q10 (COQ10) 100 MG CAPS Take by mouth daily.    . Evening Primrose Oil 1000 MG CAPS Take by mouth daily.    Marland Kitchen linaclotide (LINZESS) 145 MCG CAPS capsule Take 1 capsule (145 mcg total) by mouth daily  before breakfast.  . losartan-hydrochlorothiazide (HYZAAR) 100-12.5 MG tablet Take 0.5 tablets by mouth  daily.  . Multiple Vitamin (MULTIVITAMIN) capsule Take 1 capsule by mouth daily.    . pravastatin (PRAVACHOL) 40 MG tablet Take 0.5 tablets (20 mg total) by mouth daily.  . [DISCONTINUED] azithromycin (ZITHROMAX) 250 MG tablet Take 2 tabs PO x 1 dose, then 1 tab PO QD x 4 days  . HYDROcodone-homatropine (HYCODAN) 5-1.5 MG/5ML syrup Take 5 mLs by mouth every 6 (six) hours as needed for cough.  Marland Kitchen levofloxacin (LEVAQUIN) 500 MG tablet Take 1 tablet (500 mg total) by mouth daily.  . predniSONE (DELTASONE) 20 MG tablet Take one tab twice daily for 3 days...       Then decrease to one tab each AM for 3 days...       Then decrease to 1/2 tab each AM for 3 days...       Then decrease to 1/2 tab every other day til gone (1/2, 0, 1/2, 0)  . [DISCONTINUED] predniSONE (DELTASONE) 20 MG tablet Take 1 tablet twice a day for 2 days, take one tablet every day for 2 days, take 1/2 tablet every day for 2 days, then take 1/2 tablet every other day until gone (1/2, 0,1/2,0)  . [EXPIRED] methylPREDNISolone acetate (DEPO-MEDROL) injection 80 mg    No facility-administered encounter medications on file as of 11/27/2017.     No Known Allergies    Immunization History  Administered Date(s) Administered  . Influenza Split 10/12/2011, 10/11/2012  . Influenza Whole 10/14/2009, 10/12/2010  . Influenza,inj,Quad PF,6+ Mos 08/27/2013, 10/14/2014, 11/24/2015, 10/02/2016  . Influenza-Unspecified 08/23/2017  . Pneumococcal Conjugate-13 12/11/2016  . Pneumococcal Polysaccharide-23 04/11/2012  . Td 11/23/2010    Current Medications, Allergies, Past Medical History, Past Surgical History, Family History, and Social History were reviewed in [employer] record.    Review of Systems        See HPI - all other systems neg except as noted... She is c/o foot pain and some ankle edema. The patient denies anorexia, fever, weight loss, weight gain, vision loss, decreased hearing, hoarseness, chest pain,  syncope, dyspnea on exertion, prolonged cough, headaches, hemoptysis, abdominal pain, melena, hematochezia, severe indigestion/heartburn, hematuria, incontinence, muscle weakness, suspicious skin lesions, transient blindness, difficulty walking, depression, unusual weight change, abnormal bleeding, enlarged lymph nodes, and angioedema.     Objective:   Physical Exam     WD, Overweight, 72 y/o BF in NAD... GENERAL:  Alert & oriented; pleasant & cooperative... HEENT:  [place]/AT, EOM-wnl, PERRLA, EACs-clear, TMs-wnl, NOSE-clear, THROAT-clear & wnl. NECK:  Supple w/ fairROM; no JVD; normal carotid impulses w/o bruits; no thyromegaly or nodules palpated; no lymphadenopathy. CHEST:  Clear to P & A; without wheezes/ rales/ or rhonchi. HEART:  Regular Rhythm; without murmurs/ rubs/ or gallops. ABDOMEN:  Soft & nontender; normal bowel sounds; no organomegaly or masses detected. EXT: without deformities, mod arthritic changes w/ bilat knee crepitus on exam;  no varicose veins/ +venous insuffic/ tr edema. NEURO:  CN's intact; motor testing normal; sensory testing normal; gait normal & balance OK. DERM:  No lesions noted; no rash etc... flower tatoo over right breast area...  RADIOLOGY DATA:  Reviewed in the EPIC EMR & discussed w/ the patient...  LABORATORY DATA:  Reviewed in the EPIC EMR & discussed w/ the patient...   Assessment & Plan:    HBP>  Controlled on Hyzaar, continue same + diet, exercise, get wt down... 06/12/17>   Sanika remains stable, somewhat anxious  about health issues but doing satis on a simple regimen; we reviewed diet, exercise, wt reduction strategies etc and plan ROV in 31mo w/ CXR, fasting labs, etc.  VI, edema>  Needs better low salt diet, continue Hyzaar daily...  CHOL>  FLP good on PRAVASTATIN 40mg -1/2 w/ LDL ~100; continue same med & diet efforts...  GI>  GERD/ IBS>  States her bowels are better on flax seeds in yogurt per Heidi [name]...  GU>  s/p right nephrectomy for renal  cell ca in 2001; renal funct remains WNL w/ Creat ~1.0 & CXR remains clear...  DJD>  She does elliptical exercises & states this helps her knees...  Other medical issues as noted...   Patient's Medications  New Prescriptions   HYDROCODONE-HOMATROPINE (HYCODAN) 5-1.5 MG/5ML SYRUP    Take 5 mLs by mouth every 6 (six) hours as needed for cough.   LEVOFLOXACIN (LEVAQUIN) 500 MG TABLET    Take 1 tablet (500 mg total) by mouth daily.   PREDNISONE (DELTASONE) 20 MG TABLET    Take one tab twice daily for 3 days...       Then decrease to one tab each AM for 3 days...       Then decrease to 1/2 tab each AM for 3 days...       Then decrease to 1/2 tab every other day til gone (1/2, 0, 1/2, 0)  Previous Medications   ALPHA LIPOIC ACID PO    Take by mouth daily.     ASPIRIN 81 MG TABLET    Take 81 mg by mouth daily.     AZELASTINE (ASTELIN) 0.1 % NASAL SPRAY    Place 2 sprays into both nostrils 2 (two) times daily. Use in each nostril as directed   BIOTIN 10 MG TABS    Take by mouth daily.     CHOLECALCIFEROL (VITAMIN D) 1000 UNITS TABLET    Take 1,000 Units by mouth daily. Vitamin D.   COENZYME Q10 (COQ10) 100 MG CAPS    Take by mouth daily.     EVENING PRIMROSE OIL 1000 MG CAPS    Take by mouth daily.     LINACLOTIDE (LINZESS) 145 MCG CAPS CAPSULE    Take 1 capsule (145 mcg total) by mouth daily before breakfast.   LOSARTAN-HYDROCHLOROTHIAZIDE (HYZAAR) 100-12.5 MG TABLET    Take 0.5 tablets by mouth daily.   MULTIPLE VITAMIN (MULTIVITAMIN) CAPSULE    Take 1 capsule by mouth daily.     PRAVASTATIN (PRAVACHOL) 40 MG TABLET    Take 0.5 tablets (20 mg total) by mouth daily.  Modified Medications   No medications on file  Discontinued Medications   AZITHROMYCIN (ZITHROMAX) 250 MG TABLET    Take 2 tabs PO x 1 dose, then 1 tab PO QD x 4 days

## 2017-11-27 NOTE — Patient Instructions (Signed)
Today we updated your med list in our EPIC system...    Continue your current medications the same...  Today we checked your follow up FASTING blood work...    We will contact you w/ the results when available...   For your residual respiratory infection>>     We are prescribing a stonger antibiotic= LEVAQUIN 500mg  take one tab daily...    Be sure to take an OTC Probiotic like ALIGN daily while you are on the Levaquin...     We gave you a Depo shot today (for the airway inflammation)...    Starting in the AM 2/6 - take the PREDNISONE 20mg  tabs as follows>>       Take one tab twice daily for 3 days...       Then decrease to one tab each AM for 3 days...       Then decrease to 1/2 tab each AM for 3 days...       Then decrease to 1/2 tab every other day til gone (1/2, 0, 1/2, 0)  We also wrote for a cough syrup -- HYCODAN one tsp every 6h as needed for cough...  You may also use the OTC MUCINEX 1200mg  twice daily w/ lots of water...  Call for any questions.Marland KitchenMarland Kitchen

## 2017-11-27 NOTE — Telephone Encounter (Signed)
Ok to use 10mg  tabs but advised pharmacist to explain to pt that she will be using the 10mg  tabs. Pharmacist verbalized understanding. Nothing further is needed.     We gave you a Depo shot today (for the airway inflammation)...    Starting in the AM 2/6 - take the PREDNISONE 20mg  tabs as follows>>       Take one tab twice daily for 3 days...       Then decrease to one tab each AM for 3 days...       Then decrease to 1/2 tab each AM for 3 days...       Then decrease to 1/2 tab every other day til gone (1/2, 0, 1/2, 0)

## 2017-11-30 ENCOUNTER — Other Ambulatory Visit: Payer: Self-pay | Admitting: *Deleted

## 2017-11-30 MED ORDER — VITAMIN D 1000 UNITS PO TABS: ORAL_TABLET | ORAL | 3 refills | Status: DC

## 2017-12-13 ENCOUNTER — Ambulatory Visit: Payer: Medicare HMO | Admitting: Pulmonary Disease

## 2017-12-14 ENCOUNTER — Ambulatory Visit: Payer: Medicare HMO | Admitting: Nurse Practitioner

## 2017-12-17 ENCOUNTER — Ambulatory Visit: Payer: Medicare HMO | Admitting: Obstetrics and Gynecology

## 2017-12-31 ENCOUNTER — Ambulatory Visit (INDEPENDENT_AMBULATORY_CARE_PROVIDER_SITE_OTHER): Payer: Medicare HMO | Admitting: Obstetrics and Gynecology

## 2017-12-31 ENCOUNTER — Encounter: Payer: Self-pay | Admitting: Obstetrics and Gynecology

## 2017-12-31 ENCOUNTER — Other Ambulatory Visit: Payer: Self-pay

## 2017-12-31 VITALS — BP 106/78 | HR 66 | Resp 14 | Ht 65.0 in | Wt 181.4 lb

## 2017-12-31 DIAGNOSIS — R350 Frequency of micturition: Secondary | ICD-10-CM | POA: Diagnosis not present

## 2017-12-31 DIAGNOSIS — Z0000 Encounter for general adult medical examination without abnormal findings: Secondary | ICD-10-CM | POA: Diagnosis not present

## 2017-12-31 DIAGNOSIS — Z01419 Encounter for gynecological examination (general) (routine) without abnormal findings: Secondary | ICD-10-CM | POA: Diagnosis not present

## 2017-12-31 LAB — POCT URINALYSIS DIPSTICK
Bilirubin, UA: NEGATIVE
Glucose, UA: NEGATIVE
Ketones, UA: NEGATIVE
NITRITE UA: NEGATIVE
PH UA: 5 (ref 5.0–8.0)
PROTEIN UA: NEGATIVE
RBC UA: NEGATIVE
Urobilinogen, UA: 0.2 E.U./dL

## 2017-12-31 NOTE — Progress Notes (Signed)
72 y.o. [id number] DivorcedAfrican AmericanF here for annual exam.  H/O TAH for fibroids.  She has intermittent increased frequency of urination, mainly when she drinks tea. Nocturia 1-2 x a night. Voids small to normal amounts. No dysuria. She has noticed an increase in white vaginal d/c in the last few days.  Same partner x 10 years (don't live together). No dyspareunia.     Patient's last menstrual period was 10/23/1994 (approximate).          Sexually active: Yes.    The current method of family planning is status post hysterectomy.    Exercising: Yes.    Gym/ health club routine includes cardio. Smoker:  Former smoker   Health Maintenance: Pap:  2001 WNL History of abnormal Pap:  no MMG:  01/03/17 BIRADS 1 negative  Colonoscopy:  09/10/13, normal, repeat 10 years  BMD:   10/22/10, normal TDaP:  11/23/10    reports that she quit smoking about 34 years ago. Her smoking use included cigarettes. She has a 2.50 pack-year smoking history. she has never used smokeless tobacco. She reports that she drinks alcohol. She reports that she does not use drugs. Occasional ETOH. 2 grown kids, live in [place]. 2 grandchildren: 5 and 7. 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   Past Medical History:  Diagnosis Date  . Anxiety   . Chronic rhinitis   . DJD (degenerative joint disease)   . GERD (gastroesophageal reflux disease)   . History of headache   . HTN (hypertension)   . Hypercholesterolemia   . IBS (irritable bowel syndrome)   . Renal cell carcinoma 2001  . Venous insufficiency     Past Surgical History:  Procedure Laterality Date  . NEPHRECTOMY  2001   right = for renal cell CA  . TOTAL ABDOMINAL HYSTERECTOMY    . TUBAL LIGATION      Current Outpatient Medications  Medication Sig Dispense Refill  . ALPHA LIPOIC ACID PO Take by mouth daily.      Marland Kitchen aspirin 81 MG tablet Take 81 mg by mouth daily.      . Biotin 10 MG TABS Take by mouth daily.      . cholecalciferol  (VITAMIN D) 1000 units tablet Take 2 tablets daily. 30 tablet 3  . Coenzyme Q10 (COQ10) 100 MG CAPS Take by mouth daily.      . Evening Primrose Oil 1000 MG CAPS Take by mouth daily.      Marland Kitchen linaclotide (LINZESS) 145 MCG CAPS capsule Take 1 capsule (145 mcg total) by mouth daily before breakfast. 30 capsule 11  . losartan-hydrochlorothiazide (HYZAAR) 100-12.5 MG tablet Take 0.5 tablets by mouth daily. 15 tablet 11  . Multiple Vitamin (MULTIVITAMIN) capsule Take 1 capsule by mouth daily.      . pravastatin (PRAVACHOL) 40 MG tablet Take 0.5 tablets (20 mg total) by mouth daily. 15 tablet 11   No current facility-administered medications for this visit.     Family History  Problem Relation Age of Onset  . Healthy Mother   . Diabetes Mother   . Hyperlipidemia Mother   . Hypertension Mother   . Prostate cancer Father   . Cancer Father   . Breast cancer Sister   . Cancer - Other Brother        neck ? lymph nodes  . Colon cancer Neg Hx   . Pancreatic cancer Neg Hx   . Stomach cancer Neg Hx     Review of Systems  Constitutional:  Negative.   HENT: Negative.   Eyes: Negative.   Respiratory: Negative.   Cardiovascular: Negative.   Gastrointestinal: Positive for abdominal distention and constipation. Negative for abdominal pain.  Endocrine: Negative.   Genitourinary: Positive for frequency.       Night urination  Loss of sexual interest  Musculoskeletal: Negative.   Skin: Negative.   Allergic/Immunologic: Negative.   Neurological: Negative.   Hematological: Negative.   Psychiatric/Behavioral: Negative.   BM's are fine if she eats right and takes the Linzess.   Exam:   BP 106/78 (BP Location: Right Arm, Patient Position: Sitting, Cuff Size: Normal)   Pulse 66   Resp 14   Ht 5\' 5"  (1.651 m)   Wt 181 lb 6.4 oz (82.3 kg)   LMP 10/23/1994 (Approximate)   BMI 30.19 kg/m   Weight change: @WEIGHTCHANGE @ Height:   Height: 5\' 5"  (165.1 cm)  Ht Readings from Last 3 Encounters:   12/31/17 5\' 5"  (1.651 m)  11/27/17 5\' 5"  (1.651 m)  11/08/17 5' 5.5" (1.664 m)    General appearance: alert, cooperative and appears stated age Head: Normocephalic, without obvious abnormality, atraumatic Neck: no adenopathy, supple, symmetrical, trachea midline and thyroid normal to inspection and palpation Lungs: clear to auscultation bilaterally Cardiovascular: regular rate and rhythm Breasts: normal appearance, no masses or tenderness Abdomen: soft, non-tender; non distended,  no masses,  no organomegaly Extremities: extremities normal, atraumatic, no cyanosis or edema Skin: Skin color, texture, turgor normal. No rashes or lesions Lymph nodes: Cervical, supraclavicular, and axillary nodes normal. No abnormal inguinal nodes palpated Neurologic: Grossly normal   Pelvic: External genitalia:  no lesions              Urethra:  normal appearing urethra with no masses, tenderness or lesions              Bartholins and Skenes: normal                 Vagina: normal appearing atrophic vagina with normal color and discharge, no lesions              Cervix: absent               Bimanual Exam:  Uterus:  uterus absent              Adnexa: no mass, fullness, tenderness               Rectovaginal: Confirms               Anus:  normal sphincter tone, no lesions  Chaperone was present for exam.  A:  Well Woman with normal exam  H/O TAH  Urinary frequency, likely from caffeine intake. Urine with WBC   P:   No pap   Discussed breast self exam  Discussed calcium and vit D intake  Discussed breast self exam  Discussed calcium and vit D intake  Mammogram this week  Urine dip with WBC, will send ccua for ua, c&s

## 2017-12-31 NOTE — Addendum Note (Signed)
Addended by: Karmen Bongo T on: 12/31/2017 04:25 PM   Modules accepted: Orders

## 2017-12-31 NOTE — Patient Instructions (Signed)
EXERCISE AND DIET:  We recommended that you start or continue a regular exercise program for good health. Regular exercise means any activity that makes your heart beat faster and makes you sweat.  We recommend exercising at least 30 minutes per day at least 3 days a week, preferably 4 or 5.  We also recommend a diet low in fat and sugar.  Inactivity, poor dietary choices and obesity can cause diabetes, heart attack, stroke, and kidney damage, among others.    ALCOHOL AND SMOKING:  Women should limit their alcohol intake to no more than 7 drinks/beers/glasses of wine (combined, not each!) per week. Moderation of alcohol intake to this level decreases your risk of breast cancer and liver damage. And of course, no recreational drugs are part of a healthy lifestyle.  And absolutely no smoking or even second hand smoke. Most people know smoking can cause heart and lung diseases, but did you know it also contributes to weakening of your bones? Aging of your skin?  Yellowing of your teeth and nails?  CALCIUM AND VITAMIN D:  Adequate intake of calcium and Vitamin D are recommended.  The recommendations for exact amounts of these supplements seem to change often, but generally speaking 600 mg of calcium (either carbonate or citrate) and 800 units of Vitamin D per day seems prudent. Certain women may benefit from higher intake of Vitamin D.  If you are among these women, your doctor will have told you during your visit.    PAP SMEARS:  Pap smears, to check for cervical cancer or precancers,  have traditionally been done yearly, although recent scientific advances have shown that most women can have pap smears less often.  However, every woman still should have a physical exam from her gynecologist every year. It will include a breast check, inspection of the vulva and vagina to check for abnormal growths or skin changes, a visual exam of the cervix, and then an exam to evaluate the size and shape of the uterus and  ovaries.  And after 71 years of age, a rectal exam is indicated to check for rectal cancers. We will also provide age appropriate advice regarding health maintenance, like when you should have certain vaccines, screening for sexually transmitted diseases, bone density testing, colonoscopy, mammograms, etc.   MAMMOGRAMS:  All women over 40 years old should have a yearly mammogram. Many facilities now offer a "3D" mammogram, which may cost around $50 extra out of pocket. If possible,  we recommend you accept the option to have the 3D mammogram performed.  It both reduces the number of women who will be called back for extra views which then turn out to be normal, and it is better than the routine mammogram at detecting truly abnormal areas.    COLONOSCOPY:  Colonoscopy to screen for colon cancer is recommended for all women at age 50.  We know, you hate the idea of the prep.  We agree, BUT, having colon cancer and not knowing it is worse!!  Colon cancer so often starts as a polyp that can be seen and removed at colonscopy, which can quite literally save your life!  And if your first colonoscopy is normal and you have no family history of colon cancer, most women don't have to have it again for 10 years.  Once every ten years, you can do something that may end up saving your life, right?  We will be happy to help you get it scheduled when you are ready.    Be sure to check your insurance coverage so you understand how much it will cost.  It may be covered as a preventative service at no cost, but you should check your particular policy.      Breast Self-Awareness Breast self-awareness means being familiar with how your breasts look and feel. It involves checking your breasts regularly and reporting any changes to your health care provider. Practicing breast self-awareness is important. A change in your breasts can be a sign of a serious medical problem. Being familiar with how your breasts look and feel allows  you to find any problems early, when treatment is more likely to be successful. All women should practice breast self-awareness, including women who have had breast implants. How to do a breast self-exam One way to learn what is normal for your breasts and whether your breasts are changing is to do a breast self-exam. To do a breast self-exam: Look for Changes  1. Remove all the clothing above your waist. 2. Stand in front of a mirror in a room with good lighting. 3. Put your hands on your hips. 4. Push your hands firmly downward. 5. Compare your breasts in the mirror. Look for differences between them (asymmetry), such as: ? Differences in shape. ? Differences in size. ? Puckers, dips, and bumps in one breast and not the other. 6. Look at each breast for changes in your skin, such as: ? Redness. ? Scaly areas. 7. Look for changes in your nipples, such as: ? Discharge. ? Bleeding. ? Dimpling. ? Redness. ? A change in position. Feel for Changes  Carefully feel your breasts for lumps and changes. It is best to do this while lying on your back on the floor and again while sitting or standing in the shower or tub with soapy water on your skin. Feel each breast in the following way:  Place the arm on the side of the breast you are examining above your head.  Feel your breast with the other hand.  Start in the nipple area and make  inch (2 cm) overlapping circles to feel your breast. Use the pads of your three middle fingers to do this. Apply light pressure, then medium pressure, then firm pressure. The light pressure will allow you to feel the tissue closest to the skin. The medium pressure will allow you to feel the tissue that is a little deeper. The firm pressure will allow you to feel the tissue close to the ribs.  Continue the overlapping circles, moving downward over the breast until you feel your ribs below your breast.  Move one finger-width toward the center of the body.  Continue to use the  inch (2 cm) overlapping circles to feel your breast as you move slowly up toward your collarbone.  Continue the up and down exam using all three pressures until you reach your armpit.  Write Down What You Find  Write down what is normal for each breast and any changes that you find. Keep a written record with breast changes or normal findings for each breast. By writing this information down, you do not need to depend only on memory for size, tenderness, or location. Write down where you are in your menstrual cycle, if you are still menstruating. If you are having trouble noticing differences in your breasts, do not get discouraged. With time you will become more familiar with the variations in your breasts and more comfortable with the exam. How often should I examine my breasts? Examine   your breasts every month. If you are breastfeeding, the best time to examine your breasts is after a feeding or after using a breast pump. If you menstruate, the best time to examine your breasts is 5-7 days after your period is over. During your period, your breasts are lumpier, and it may be more difficult to notice changes. When should I see my health care provider? See your health care provider if you notice:  A change in shape or size of your breasts or nipples.  A change in the skin of your breast or nipples, such as a reddened or scaly area.  Unusual discharge from your nipples.  A lump or thick area that was not there before.  Pain in your breasts.  Anything that concerns you.  This information is not intended to replace advice given to you by your health care provider. Make sure you discuss any questions you have with your health care provider. Document Released: 10/09/2005 Document Revised: 03/16/2016 Document Reviewed: 08/29/2015 Elsevier Interactive Patient Education  2018 Elsevier Inc.  

## 2018-01-01 LAB — URINE CULTURE: ORGANISM ID, BACTERIA: NO GROWTH

## 2018-01-01 LAB — URINALYSIS, MICROSCOPIC ONLY: CASTS: NONE SEEN /LPF

## 2018-01-04 ENCOUNTER — Ambulatory Visit
Admission: RE | Admit: 2018-01-04 | Discharge: 2018-01-04 | Disposition: A | Discharge status: Home / Self Care | Payer: Medicare HMO | Source: Ambulatory Visit | Attending: Obstetrics and Gynecology | Admitting: Obstetrics and Gynecology

## 2018-01-04 DIAGNOSIS — Z1231 Encounter for screening mammogram for malignant neoplasm of breast: Secondary | ICD-10-CM | POA: Diagnosis not present

## 2018-01-18 ENCOUNTER — Encounter: Payer: Self-pay | Admitting: Gastroenterology

## 2018-03-04 ENCOUNTER — Encounter: Payer: Self-pay | Admitting: Gastroenterology

## 2018-03-04 ENCOUNTER — Ambulatory Visit: Payer: Medicare HMO | Admitting: Gastroenterology

## 2018-03-04 VITALS — BP 130/72 | HR 68 | Ht 65.5 in | Wt 189.4 lb

## 2018-03-04 DIAGNOSIS — K581 Irritable bowel syndrome with constipation: Secondary | ICD-10-CM

## 2018-03-04 MED ORDER — LINACLOTIDE 145 MCG PO CAPS: 145.0000 ug | ORAL_CAPSULE | Freq: Every day | ORAL | 3 refills | Status: DC

## 2018-03-04 NOTE — Progress Notes (Signed)
    History of Present Illness: This is a 72 year old female with IBS-C.  Her symptoms are under good control however she does have occasional episodes of mild left lower quadrant pain and constipation with hard pellet-like stools despite remaining on Linzess.  The symptoms tend to respond to increases in dietary fiber and water.  She states when she is under stress her constipation can worsen as well.  Current Medications, Allergies, Past Medical History, Past Surgical History, Family History and Social History were reviewed in [employer] record.  Physical Exam: General: Well developed, well nourished, no acute distress Head: Normocephalic and atraumatic Eyes:  sclerae anicteric, EOMI Ears: Normal auditory acuity Mouth: No deformity or lesions Lungs: Clear throughout to auscultation Heart: Regular rate and rhythm; no murmurs, rubs or bruits Abdomen: Soft, non tender and non distended. No masses, hepatosplenomegaly or hernias noted. Normal Bowel sounds Rectal: Not done Musculoskeletal: Symmetrical with no gross deformities  Pulses:  Normal pulses noted Extremities: No clubbing, cyanosis, edema or deformities noted Neurological: Alert oriented x 4, grossly nonfocal Psychological:  Alert and cooperative. Normal mood and affect  Assessment and Recommendations:  1. IBS-C. Refill Linzess 145 mcg qd.  Maintain a high-fiber diet with adequate daily water intake. MiraLAX daily or MOM daily as needed in addition to [place].  Call if symptoms are not adequately controlled.  REV in 1 year.

## 2018-03-04 NOTE — Patient Instructions (Signed)
We have sent the following prescriptions to your mail in pharmacy:Linzess   If you have not heard from your mail in pharmacy within 1 week or if you have not received your medication in the mail, please contact us at [phone] so we may find out why.   Normal BMI (Body Mass Index- based on height and weight) is between 23 and 30. Your BMI today is Body mass index is 31.03 kg/m. Marland Kitchen Please consider follow up  regarding your BMI with your Primary Care Provider.  Thank you for choosing me and [facility].  Pricilla Riffle. Dagoberto Ligas., MD., Marval Regal

## 2018-05-06 ENCOUNTER — Ambulatory Visit (INDEPENDENT_AMBULATORY_CARE_PROVIDER_SITE_OTHER): Payer: Medicare HMO | Admitting: Nurse Practitioner

## 2018-05-06 ENCOUNTER — Encounter: Payer: Self-pay | Admitting: Nurse Practitioner

## 2018-05-06 VITALS — BP 124/80 | HR 62 | Temp 98.2°F | Ht 65.5 in | Wt 186.0 lb

## 2018-05-06 DIAGNOSIS — J310 Chronic rhinitis: Secondary | ICD-10-CM | POA: Diagnosis not present

## 2018-05-06 DIAGNOSIS — E7800 Pure hypercholesterolemia, unspecified: Secondary | ICD-10-CM

## 2018-05-06 DIAGNOSIS — I10 Essential (primary) hypertension: Secondary | ICD-10-CM

## 2018-05-06 MED ORDER — FLUTICASONE PROPIONATE 50 MCG/ACT NA SUSP: 2.0000 | Freq: Every day | NASAL | 3 refills | Status: DC

## 2018-05-06 MED ORDER — OXYMETAZOLINE HCL 0.05 % NA SOLN: 1.0000 | Freq: Two times a day (BID) | NASAL | 0 refills | Status: DC

## 2018-05-06 MED ORDER — CETIRIZINE HCL 5 MG PO TABS: 5.0000 mg | ORAL_TABLET | Freq: Every day | ORAL | Status: AC

## 2018-05-06 NOTE — Patient Instructions (Addendum)
Pravastatin recalled batch :NDC# 47841-282-08; Lot# [id number] A Announced in 2017. Please inquire with your pharmacist about any recent recall.  Your 10year risk for developing cardiovascular disease is 10.10%  Flonase and Afrin use: apply 1spray of afrin in each nare, wait 87mins, then apply 2sprays of flonase in each nare. Use both nasal spray consecutively x 3days, then flonase only for at least 14days.  May also use saline sinus rinse 1-2times a day to moisturize nasal passages.

## 2018-05-06 NOTE — Progress Notes (Signed)
Subjective:  Patient ID: Heidi [name], female    DOB: May 26, 1946  Age: 72 y.o. MRN: [id number]  CC: Establish Care (est care/sinus pressure,congested ears/had walking pneumonia in 10/2017--Dr. nadal steted still has some inflamation/ pt is taking adile sinus to help) and Medication Refill (pravastatin recall consult?)  Sinus Problem  This is a recurrent problem. The current episode started more than 1 month ago. The problem has been waxing and waning since onset. There has been no fever. Associated symptoms include congestion, ear pain, headaches and sinus pressure. Pertinent negatives include no chills, coughing, diaphoresis, hoarse voice, neck pain, shortness of breath, sneezing, sore throat or swollen glands. Past treatments include nothing.   Hyperlipidemia: LDL at 104 HDL at 89 10year risk for developing cardiovascular disease is 10.10% She will like to know if there is any current recall on pravastatin.  Denies any adverse effects with medication  HTN: Controlled with losartan/HCTZ. BP Readings from Last 3 Encounters:  05/06/18 124/80  03/04/18 130/72  12/31/17 106/78   Reviewed [facility] today  Outpatient Medications Prior to Visit  Medication Sig Dispense Refill  . ALPHA LIPOIC ACID PO Take by mouth daily.      Marland Kitchen aspirin 81 MG tablet Take 81 mg by mouth daily.      . Biotin 10 MG TABS Take by mouth daily.      . cholecalciferol (VITAMIN D) 1000 units tablet Take 2 tablets daily. 30 tablet 3  . Coenzyme Q10 (COQ10) 100 MG CAPS Take by mouth daily.      . Evening Primrose Oil 1000 MG CAPS Take by mouth daily.      Marland Kitchen linaclotide (LINZESS) 145 MCG CAPS capsule Take 1 capsule (145 mcg total) by mouth daily before breakfast. 90 capsule 3  . losartan-hydrochlorothiazide (HYZAAR) 100-12.5 MG tablet Take 0.5 tablets by mouth daily. 15 tablet 11  . Multiple Vitamin (MULTIVITAMIN) capsule Take 1 capsule by mouth daily.      . pravastatin (PRAVACHOL) 40 MG tablet Take 0.5 tablets (20 mg  total) by mouth daily. 15 tablet 11   No facility-administered medications prior to visit.     ROS See HPI  Objective:  BP 124/80   Pulse 62   Temp 98.2 F (36.8 C) (Oral)   Ht 5' 5.5" (1.664 m)   Wt 186 lb (84.4 kg)   LMP 10/23/1994 (Approximate)   SpO2 98%   BMI 30.48 kg/m   BP Readings from Last 3 Encounters:  05/06/18 124/80  03/04/18 130/72  12/31/17 106/78    Wt Readings from Last 3 Encounters:  05/06/18 186 lb (84.4 kg)  03/04/18 189 lb 6 oz (85.9 kg)  12/31/17 181 lb 6.4 oz (82.3 kg)    Physical Exam  Constitutional: She is oriented to person, place, and time. No distress.  HENT:  Right Ear: External ear and ear canal normal. A middle ear effusion is present.  Left Ear: External ear and ear canal normal. A middle ear effusion is present.  Nose: Mucosal edema and rhinorrhea present. No sinus tenderness. Right sinus exhibits maxillary sinus tenderness. Right sinus exhibits no frontal sinus tenderness. Left sinus exhibits maxillary sinus tenderness. Left sinus exhibits no frontal sinus tenderness.  Mouth/Throat: Uvula is midline. Posterior oropharyngeal erythema present. No oropharyngeal exudate or posterior oropharyngeal edema.  Cardiovascular: Normal rate, regular rhythm and normal heart sounds.  Pulmonary/Chest: Effort normal and breath sounds normal. She has no wheezes.  Neurological: She is alert and oriented to person, place, and time.  Psychiatric: She  has a normal mood and affect. Her behavior is normal.  Vitals reviewed.   Lab Results  Component Value Date   WBC 5.8 11/27/2017   HGB 13.3 11/27/2017   HCT 40.2 11/27/2017   PLT 342.0 11/27/2017   GLUCOSE 91 11/27/2017   CHOL 175 11/27/2017   TRIG 74.0 11/27/2017   HDL 56.00 11/27/2017   LDLDIRECT 149.6 07/08/2007   LDLCALC 104 (H) 11/27/2017   ALT 12 11/27/2017   AST 17 11/27/2017   NA 141 11/27/2017   K 4.5 11/27/2017   CL 109 11/27/2017   CREATININE 1.07 (H) 11/27/2017   BUN 15 11/27/2017     CO2 27 11/27/2017   TSH 2.03 11/27/2017    Mm Screening Breast Tomo Bilateral  Result Date: 01/04/2018 CLINICAL DATA:  Screening. EXAM: DIGITAL SCREENING BILATERAL MAMMOGRAM WITH TOMO AND CAD COMPARISON:  Previous exam(s). ACR Breast Density Category b: There are scattered areas of fibroglandular density. FINDINGS: There are no findings suspicious for malignancy. Images were processed with CAD. IMPRESSION: No mammographic evidence of malignancy. A result letter of this screening mammogram will be mailed directly to the patient. RECOMMENDATION: Screening mammogram in one year. (Code:[id number]-B-01Y) BI-RADS CATEGORY  1: Negative. Electronically Signed   By: Lajean Manes M.D.   On: 01/04/2018 13:19    Assessment & Plan:   Heidi [name] was seen today for establish care and medication refill.  Diagnoses and all orders for this visit:  Chronic rhinitis -     fluticasone (FLONASE) 50 MCG/ACT nasal spray; Place 2 sprays into both nostrils daily. -     oxymetazoline (AFRIN NASAL SPRAY) 0.05 % nasal spray; Place 1 spray into both nostrils 2 (two) times daily. Use only for 3days, then stop -     cetirizine (ZYRTEC) 5 MG tablet; Take 1 tablet (5 mg total) by mouth at bedtime.  Essential hypertension  HYPERCHOLESTEROLEMIA   I am having Heidi [name] start on fluticasone, oxymetazoline, and cetirizine. I am also having her maintain her aspirin, ALPHA LIPOIC ACID PO, Biotin, CoQ10, Evening Primrose Oil, multivitamin, losartan-hydrochlorothiazide, pravastatin, cholecalciferol, and linaclotide.  Meds ordered this encounter  Medications  . fluticasone (FLONASE) 50 MCG/ACT nasal spray    Sig: Place 2 sprays into both nostrils daily.    Dispense:  16 g    Refill:  3    Order Specific Question:   Supervising Provider    Answer:   MATTHEWS, CODY [[id number]]  . oxymetazoline (AFRIN NASAL SPRAY) 0.05 % nasal spray    Sig: Place 1 spray into both nostrils 2 (two) times daily. Use only for 3days, then stop     Dispense:  30 mL    Refill:  0    Order Specific Question:   Supervising Provider    Answer:   MATTHEWS, CODY [[id number]]  . cetirizine (ZYRTEC) 5 MG tablet    Sig: Take 1 tablet (5 mg total) by mouth at bedtime.    Order Specific Question:   Supervising Provider    Answer:   Luetta Nutting [[id number]]    Follow-up: Return in about 6 months (around 11/06/2018) for hyperlipidemia and HTN (fasting).  Wilfred Lacy, NP

## 2018-06-14 ENCOUNTER — Encounter: Payer: Self-pay | Admitting: Nurse Practitioner

## 2018-06-14 DIAGNOSIS — E7800 Pure hypercholesterolemia, unspecified: Secondary | ICD-10-CM

## 2018-06-14 DIAGNOSIS — I10 Essential (primary) hypertension: Secondary | ICD-10-CM

## 2018-06-17 MED ORDER — LOSARTAN POTASSIUM-HCTZ 100-12.5 MG PO TABS: 0.5000 | ORAL_TABLET | Freq: Every day | ORAL | 3 refills | Status: DC

## 2018-06-17 MED ORDER — PRAVASTATIN SODIUM 40 MG PO TABS: 20.0000 mg | ORAL_TABLET | Freq: Every day | ORAL | 3 refills | Status: DC

## 2018-06-17 NOTE — Telephone Encounter (Signed)
Charlotte please advise, we never send in these 2 meds for the pt before. First ov with Nche was 05/06/2018 and scheduled next ov 11/06/2018. Ok to send in until then? Last lipid w/ Lenna Gilford 11/2017.

## 2018-07-08 DIAGNOSIS — R69 Illness, unspecified: Secondary | ICD-10-CM | POA: Diagnosis not present

## 2018-09-09 DIAGNOSIS — L821 Other seborrheic keratosis: Secondary | ICD-10-CM | POA: Diagnosis not present

## 2018-09-09 DIAGNOSIS — D2371 Other benign neoplasm of skin of right lower limb, including hip: Secondary | ICD-10-CM | POA: Diagnosis not present

## 2018-11-06 ENCOUNTER — Ambulatory Visit (INDEPENDENT_AMBULATORY_CARE_PROVIDER_SITE_OTHER): Payer: Medicare HMO | Admitting: Nurse Practitioner

## 2018-11-06 ENCOUNTER — Encounter: Payer: Self-pay | Admitting: Nurse Practitioner

## 2018-11-06 VITALS — BP 100/70 | HR 63 | Temp 98.8°F | Ht 66.0 in | Wt 177.8 lb

## 2018-11-06 DIAGNOSIS — I10 Essential (primary) hypertension: Secondary | ICD-10-CM | POA: Diagnosis not present

## 2018-11-06 DIAGNOSIS — K5900 Constipation, unspecified: Secondary | ICD-10-CM

## 2018-11-06 DIAGNOSIS — E7800 Pure hypercholesterolemia, unspecified: Secondary | ICD-10-CM

## 2018-11-06 LAB — COMPREHENSIVE METABOLIC PANEL
ALT: 47 U/L — ABNORMAL HIGH (ref 0–35)
AST: 30 U/L (ref 0–37)
Albumin: 4.6 g/dL (ref 3.5–5.2)
Alkaline Phosphatase: 61 U/L (ref 39–117)
BUN: 13 mg/dL (ref 6–23)
CHLORIDE: 101 meq/L (ref 96–112)
CO2: 27 meq/L (ref 19–32)
Calcium: 9.7 mg/dL (ref 8.4–10.5)
Creatinine, Ser: 1.06 mg/dL (ref 0.40–1.20)
GFR: 65.62 mL/min (ref >60.00)
GLUCOSE: 91 mg/dL (ref 70–99)
POTASSIUM: 3.3 meq/L — AB (ref 3.5–5.1)
Sodium: 137 mEq/L (ref 135–145)
Total Bilirubin: 0.6 mg/dL (ref 0.2–1.2)
Total Protein: 7.7 g/dL (ref 6.0–8.3)

## 2018-11-06 LAB — LIPID PANEL
CHOL/HDL RATIO: 3
Cholesterol: 167 mg/dL (ref 0–200)
HDL: 59.5 mg/dL (ref >39.00)
LDL Cholesterol: 94 mg/dL (ref 0–99)
NONHDL: 107.53
Triglycerides: 68 mg/dL (ref 0.0–149.0)
VLDL: 13.6 mg/dL (ref 0.0–40.0)

## 2018-11-06 MED ORDER — LINACLOTIDE 145 MCG PO CAPS: 145.0000 ug | ORAL_CAPSULE | Freq: Every day | ORAL | 3 refills | Status: DC

## 2018-11-06 MED ORDER — LOSARTAN POTASSIUM 50 MG PO TABS: 50.0000 mg | ORAL_TABLET | Freq: Every day | ORAL | 3 refills | Status: DC

## 2018-11-06 NOTE — Patient Instructions (Addendum)
Stop current BP medication (Hyzaar) Start losartan 50mg  for HTN.  Stable renal and liver function. Mild decrease in potassium, but this will improve with discontinuation of HCTZ. F/up in 47months.  If you have medicare related insurance (such as traditional Medicare, [employer], [employer], or similar), Please make an appointment at the scheduling desk with [place], for your Wellness visit in this office, which is a benefit with your insurance.

## 2018-11-06 NOTE — Progress Notes (Signed)
Subjective:  Patient ID: Heidi [name], female    DOB: 10/15/1946  Age: 73 y.o. MRN: [id number]  CC: Follow-up (f/u 6 mo HTN and HYperlipidemia/ only complaint linzest is expensive (fasting))  HPI  HTN: Stable with hyzaar. BP Readings from Last 3 Encounters:  11/06/18 100/70  05/06/18 124/80  03/04/18 130/72   Hyperlipidemia: LDL at goal Use pravastatin. Lipid Panel     Component Value Date/Time   CHOL 167 11/06/2018 1008   TRIG 68.0 11/06/2018 1008   HDL 59.50 11/06/2018 1008   CHOLHDL 3 11/06/2018 1008   VLDL 13.6 11/06/2018 1008   LDLCALC 94 11/06/2018 1008   LDLDIRECT 149.6 07/08/2007 1125   Reviewed past Medical, Social and Family history today.  Outpatient Medications Prior to Visit  Medication Sig Dispense Refill  . ALPHA LIPOIC ACID PO Take by mouth daily.      Marland Kitchen aspirin 81 MG tablet Take 81 mg by mouth daily.      . Biotin 10 MG TABS Take by mouth daily.      . cetirizine (ZYRTEC) 5 MG tablet Take 1 tablet (5 mg total) by mouth at bedtime.    . cholecalciferol (VITAMIN D) 1000 units tablet Take 2 tablets daily. 30 tablet 3  . Coenzyme Q10 (COQ10) 100 MG CAPS Take by mouth daily.      . Evening Primrose Oil 1000 MG CAPS Take by mouth daily.      . fluticasone (FLONASE) 50 MCG/ACT nasal spray Place 2 sprays into both nostrils daily. 16 g 3  . Multiple Vitamin (MULTIVITAMIN) capsule Take 1 capsule by mouth daily.      Marland Kitchen oxymetazoline (AFRIN NASAL SPRAY) 0.05 % nasal spray Place 1 spray into both nostrils 2 (two) times daily. Use only for 3days, then stop 30 mL 0  . pravastatin (PRAVACHOL) 40 MG tablet Take 0.5 tablets (20 mg total) by mouth daily. 45 tablet 3  . linaclotide (LINZESS) 145 MCG CAPS capsule Take 1 capsule (145 mcg total) by mouth daily before breakfast. 90 capsule 3  . losartan-hydrochlorothiazide (HYZAAR) 100-12.5 MG tablet Take 0.5 tablets by mouth daily. 45 tablet 3   No facility-administered medications prior to visit.     ROS See  HPI  Objective:  BP 100/70   Pulse 63   Temp 98.8 F (37.1 C) (Oral)   Ht 5\' 6"  (1.676 m)   Wt 177 lb 12.8 oz (80.6 kg)   LMP 10/23/1994 (Approximate)   SpO2 99%   BMI 28.70 kg/m   BP Readings from Last 3 Encounters:  11/06/18 100/70  05/06/18 124/80  03/04/18 130/72    Wt Readings from Last 3 Encounters:  11/06/18 177 lb 12.8 oz (80.6 kg)  05/06/18 186 lb (84.4 kg)  03/04/18 189 lb 6 oz (85.9 kg)    Physical Exam Vitals signs reviewed.  Neck:     Musculoskeletal: Normal range of motion and neck supple.  Cardiovascular:     Rate and Rhythm: Normal rate and regular rhythm.     Pulses: Normal pulses.     Heart sounds: Normal heart sounds.  Pulmonary:     Effort: Pulmonary effort is normal.     Breath sounds: Normal breath sounds.  Musculoskeletal:     Right lower leg: No edema.     Left lower leg: No edema.  Neurological:     Mental Status: She is alert and oriented to person, place, and time.     Lab Results  Component Value Date   WBC  5.8 11/27/2017   HGB 13.3 11/27/2017   HCT 40.2 11/27/2017   PLT 342.0 11/27/2017   GLUCOSE 91 11/27/2017   CHOL 175 11/27/2017   TRIG 74.0 11/27/2017   HDL 56.00 11/27/2017   LDLDIRECT 149.6 07/08/2007   LDLCALC 104 (H) 11/27/2017   ALT 12 11/27/2017   AST 17 11/27/2017   NA 141 11/27/2017   K 4.5 11/27/2017   CL 109 11/27/2017   CREATININE 1.07 (H) 11/27/2017   BUN 15 11/27/2017   CO2 27 11/27/2017   TSH 2.03 11/27/2017    Mm Screening Breast Tomo Bilateral  Result Date: 01/04/2018 CLINICAL DATA:  Screening. EXAM: DIGITAL SCREENING BILATERAL MAMMOGRAM WITH TOMO AND CAD COMPARISON:  Previous exam(s). ACR Breast Density Category b: There are scattered areas of fibroglandular density. FINDINGS: There are no findings suspicious for malignancy. Images were processed with CAD. IMPRESSION: No mammographic evidence of malignancy. A result letter of this screening mammogram will be mailed directly to the patient.  RECOMMENDATION: Screening mammogram in one year. (Code:SM-B-01Y) BI-RADS CATEGORY  1: Negative. Electronically Signed   By: Lajean Manes M.D.   On: 01/04/2018 13:19    Assessment & Plan:   Heidi [name] was seen today for follow-up.  Diagnoses and all orders for this visit:  Essential hypertension -     Comprehensive metabolic panel -     losartan (COZAAR) 50 MG tablet; Take 1 tablet (50 mg total) by mouth daily.  Constipation, unspecified constipation type -     linaclotide (LINZESS) 145 MCG CAPS capsule; Take 1 capsule (145 mcg total) by mouth daily before breakfast.  HYPERCHOLESTEROLEMIA -     Comprehensive metabolic panel -     Lipid panel   I have discontinued [name]'s losartan-hydrochlorothiazide. I am also having her start on losartan. Additionally, I am having her maintain her aspirin, ALPHA LIPOIC ACID PO, Biotin, CoQ10, Evening Primrose Oil, multivitamin, cholecalciferol, fluticasone, oxymetazoline, cetirizine, pravastatin, and linaclotide.  Meds ordered this encounter  Medications  . losartan (COZAAR) 50 MG tablet    Sig: Take 1 tablet (50 mg total) by mouth daily.    Dispense:  90 tablet    Refill:  3    Order Specific Question:   Supervising Provider    Answer:   MATTHEWS, CODY [[id number]]  . linaclotide (LINZESS) 145 MCG CAPS capsule    Sig: Take 1 capsule (145 mcg total) by mouth daily before breakfast.    Dispense:  90 capsule    Refill:  3    Order Specific Question:   Supervising Provider    Answer:   MATTHEWS, CODY [[id number]]    Problem List Items Addressed This Visit      Cardiovascular and Mediastinum   Essential hypertension - Primary   Relevant Medications   losartan (COZAAR) 50 MG tablet   Other Relevant Orders   Comprehensive metabolic panel     Other   Constipation   Relevant Medications   linaclotide (LINZESS) 145 MCG CAPS capsule   HYPERCHOLESTEROLEMIA   Relevant Medications   losartan (COZAAR) 50 MG tablet   Other Relevant Orders    Comprehensive metabolic panel   Lipid panel       Follow-up: Return in about 6 months (around 05/07/2019) for HTn and , hyperlipidemia (fasting).  Wilfred Lacy, NP

## 2018-11-08 ENCOUNTER — Encounter: Payer: Self-pay | Admitting: Nurse Practitioner

## 2018-11-08 NOTE — Telephone Encounter (Signed)
Charlotte please advise 

## 2018-11-12 NOTE — Progress Notes (Signed)
Subjective:   Yarethzy Croak is a 73 y.o. female who presents for an Initial Medicare Annual Wellness Visit.  The Patient was informed that the wellness visit is to identify future health risk and educate and initiate measures that can reduce risk for increased disease through the lifespan.   Describes health as fair, good or great? Good  Retired from Pharmac[employer].    Review of Systems   No ROS.  Medicare Wellness Visit. Additional risk factors are reflected in the social history. Cardiac Risk Factors include: advanced age (>85men, >51 women);hypertension Sleep patterns: Sleeps 10p-630a. Feels rested.  Home Safety/Smoke Alarms: Feels safe in home. Smoke alarms in place.  Lives alone in 1 story home. Walk in shower.  Eye: every December  Female:   Pap- every other year.       Mammo- 01/04/18      Dexa scan- 01/03/17       CCS- 09/10/13-normal. 70yr recall    Objective:    Today's Vitals   11/13/18 1026  BP: 112/64  Pulse: 60  SpO2: 98%  Weight: 182 lb 3.2 oz (82.6 kg)  Height: 5\' 6"  (1.676 m)   Body mass index is 29.41 kg/m.  Advanced Directives 11/13/2018  Does Patient Have a Medical Advance Directive? Yes  Type of Paramedic of [place];Living will  Does patient want to make changes to medical advance directive? No - Patient declined  Copy of [place] in Chart? No - copy requested    Current Medications (verified) Outpatient Encounter Medications as of 11/13/2018  Medication Sig  . ALPHA LIPOIC ACID PO Take by mouth daily.    Marland Kitchen aspirin 81 MG tablet Take 81 mg by mouth daily.    . Biotin 10 MG TABS Take by mouth daily.    . cetirizine (ZYRTEC) 5 MG tablet Take 1 tablet (5 mg total) by mouth at bedtime.  . cholecalciferol (VITAMIN D) 1000 units tablet Take 2 tablets daily.  . Coenzyme Q10 (COQ10) 100 MG CAPS Take by mouth daily.    . Evening Primrose Oil 1000 MG CAPS Take by mouth daily.    . fluticasone (FLONASE) 50  MCG/ACT nasal spray Place 2 sprays into both nostrils daily.  Marland Kitchen linaclotide (LINZESS) 145 MCG CAPS capsule Take 1 capsule (145 mcg total) by mouth daily before breakfast.  . losartan (COZAAR) 50 MG tablet Take 1 tablet (50 mg total) by mouth daily.  . Multiple Vitamin (MULTIVITAMIN) capsule Take 1 capsule by mouth daily.    Marland Kitchen oxymetazoline (AFRIN NASAL SPRAY) 0.05 % nasal spray Place 1 spray into both nostrils 2 (two) times daily. Use only for 3days, then stop  . pravastatin (PRAVACHOL) 40 MG tablet Take 0.5 tablets (20 mg total) by mouth daily.   No facility-administered encounter medications on file as of 11/13/2018.     Allergies (verified) Patient has no known allergies.   History: Past Medical History:  Diagnosis Date  . Anxiety   . Chronic rhinitis   . DJD (degenerative joint disease)   . GERD (gastroesophageal reflux disease)   . History of headache   . HTN (hypertension)   . Hypercholesterolemia   . IBS (irritable bowel syndrome)   . Renal cell carcinoma 2001  . Venous insufficiency    Past Surgical History:  Procedure Laterality Date  . NEPHRECTOMY  2001   right = for renal cell CA  . TOTAL ABDOMINAL HYSTERECTOMY    . TUBAL LIGATION     Family History  Problem Relation Age of Onset  . Healthy Mother   . Diabetes Mother   . Hyperlipidemia Mother   . Hypertension Mother   . Prostate cancer Father   . Cancer Father   . Breast cancer Sister   . Cancer - Other Brother        neck ? lymph nodes  . Colon cancer Neg Hx   . Pancreatic cancer Neg Hx   . Stomach cancer Neg Hx    Social History   Socioeconomic History  . Marital status: Divorced    Spouse name: Not on file  . Number of children: 2  . Years of education: Not on file  . Highest education level: Not on file  Occupational History  . Not on file  Social Needs  . Financial resource strain: Not on file  . Food insecurity:    Worry: Not on file    Inability: Not on file  . Transportation needs:     Medical: Not on file    Non-medical: Not on file  Tobacco Use  . Smoking status: Former Smoker    Packs/day: 0.25    Years: 10.00    Pack years: 2.50    Types: Cigarettes    Last attempt to quit: 10/24/1983    Years since quitting: 35.0  . Smokeless tobacco: Never Used  Substance and Sexual Activity  . Alcohol use: Yes    Alcohol/week: 0.0 standard drinks    Comment: once a month  . Drug use: No  . Sexual activity: Yes    Partners: Male    Birth control/protection: Surgical    Comment: hysterectomy  Lifestyle  . Physical activity:    Days per week: Not on file    Minutes per session: Not on file  . Stress: Not on file  Relationships  . Social connections:    Talks on phone: Not on file    Gets together: Not on file    Attends religious service: Not on file    Active member of club or organization: Not on file    Attends meetings of clubs or organizations: Not on file    Relationship status: Not on file  Other Topics Concern  . Not on file  Social History Narrative   Exercises 3x per week   Caffeine use: 2 cups per day   3 siblings healthy    Tobacco Counseling Counseling given: Not Answered   Clinical Intake:     Pain : No/denies pain                  Activities of Daily Living In your present state of health, do you have any difficulty performing the following activities: 11/13/2018  Hearing? N  Vision? N  Comment wearing glasses.  Difficulty concentrating or making decisions? N  Comment crossword puzzles. Read the [place] times.  Walking or climbing stairs? N  Dressing or bathing? N  Doing errands, shopping? N  Preparing Food and eating ? N  Using the Toilet? N  In the past six months, have you accidently leaked urine? N  Do you have problems with loss of bowel control? N  Managing your Medications? N  Managing your Finances? N  Housekeeping or managing your Housekeeping? N  Some recent data might be hidden     Immunizations and  Health Maintenance Immunization History  Administered Date(s) Administered  . Influenza Split 10/12/2011, 10/11/2012  . Influenza Whole 10/14/2009, 10/12/2010  . Influenza,inj,Quad PF,6+ Mos 08/27/2013, 10/14/2014, 11/24/2015,  10/02/2016  . Influenza-Unspecified 08/23/2017, 07/23/2018  . Pneumococcal Conjugate-13 12/11/2016  . Pneumococcal Polysaccharide-23 04/11/2012  . Td 11/23/2010   There are no preventive care reminders to display for this patient.  Patient Care Team: Nche, Charlene Brooke, NP as PCP - General (Internal Medicine)  Indicate any recent Medical Services you may have received from other than [facility] providers in the past year (date may be approximate).     Assessment:   This is a routine wellness examination for Rmoni. Physical assessment deferred to PCP.  Hearing/Vision screen  Hearing Screening   125Hz  250Hz  500Hz  1000Hz  2000Hz  3000Hz  4000Hz  6000Hz  8000Hz   Right ear:   Pass Pass Pass  Pass    Left ear:   Pass Pass Pass  Pass      Visual Acuity Screening   Right eye Left eye Both eyes  Without correction:     With correction: 20/30 20/30 20/30     Dietary issues and exercise activities discussed: Current Exercise Habits: Structured exercise class, Type of exercise: strength training/weights;stretching;calisthenics, Time (Minutes): 60, Frequency (Times/Week): 3, Weekly Exercise (Minutes/Week): 180, Intensity: Mild, Exercise limited by: None identified Diet (meal preparation, eat out, water intake, caffeinated beverages, dairy products, fruits and vegetables): in general, a "healthy" diet  , well balanced, on average, 3 meals per day  Reports she needs to drink more water.    Goals    . Weight (lb) < 170 lb (77.1 kg)      Depression Screen PHQ 2/9 Scores 11/13/2018 11/06/2018 05/06/2018 11/08/2017 04/12/2017 06/08/2016 04/13/2014  PHQ - 2 Score 0 0 0 0 0 0 0    Fall Risk Fall Risk  11/13/2018 11/06/2018 05/06/2018 11/08/2017 04/12/2017  Falls in the past year? 0 0 No  No No   Cognitive Function: MMSE - Mini Mental State Exam 11/13/2018  Orientation to time 5  Orientation to Place 5  Registration 3  Attention/ Calculation 5  Recall 3  Language- name 2 objects 2  Language- repeat 1  Language- follow 3 step command 3  Language- read & follow direction 1  Write a sentence 1  Copy design 1  Total score 30        Screening Tests Health Maintenance  Topic Date Due  . PNA vac Low Risk Adult (2 of 2 - PPSV23) 11/14/2019 (Originally 12/11/2017)  . MAMMOGRAM  01/05/2019  . TETANUS/TDAP  11/23/2020  . COLONOSCOPY  09/11/2023  . INFLUENZA VACCINE  Completed  . DEXA SCAN  Completed  . Hepatitis C Screening  Completed      Plan:    Please schedule your next medicare wellness visit with me in 1 yr.  Continue to eat a healthy diet and increase water intake  Continue your exercise regimen.  Continue doing brain stimulating activities to keep memory sharp.   Bring a copy of your living will and/or healthcare power of attorney to your next office visit.    I have personally reviewed and noted the following in the patient's chart:   . Medical and social history . Use of alcohol, tobacco or illicit drugs  . Current medications and supplements . Functional ability and status . Nutritional status . Physical activity . Advanced directives . List of other physicians . Hospitalizations, surgeries, and ER visits in previous 12 months . Vitals . Screenings to include cognitive, depression, and falls . Referrals and appointments  In addition, I have reviewed and discussed with patient certain preventive protocols, quality metrics, and best practice recommendations. A written personalized care  plan for preventive services as well as general preventive health recommendations were provided to patient.     Naaman Plummer [place], [place]   11/13/2018

## 2018-11-13 ENCOUNTER — Ambulatory Visit (INDEPENDENT_AMBULATORY_CARE_PROVIDER_SITE_OTHER): Payer: Medicare HMO | Admitting: *Deleted

## 2018-11-13 ENCOUNTER — Encounter: Payer: Self-pay | Admitting: *Deleted

## 2018-11-13 VITALS — BP 112/64 | HR 60 | Ht 66.0 in | Wt 182.2 lb

## 2018-11-13 DIAGNOSIS — Z0000 Encounter for general adult medical examination without abnormal findings: Secondary | ICD-10-CM

## 2018-11-13 NOTE — Patient Instructions (Signed)
Please schedule your next medicare wellness visit with me in 1 yr.  Continue to eat a healthy diet and increase water intake  Continue your exercise regimen.  Continue doing brain stimulating activities to keep memory sharp.   Bring a copy of your living will and/or healthcare power of attorney to your next office visit.   Heidi [name] , Thank you for taking time to come for your Medicare Wellness Visit. I appreciate your ongoing commitment to your health goals. Please review the following plan we discussed and let me know if I can assist you in the future.   These are the goals we discussed: Goals    . Weight (lb) < 170 lb (77.1 kg)       This is a list of the screening recommended for you and due dates:  Health Maintenance  Topic Date Due  . Pneumonia vaccines (2 of 2 - PPSV23) 11/14/2019*  . Mammogram  01/05/2019  . Tetanus Vaccine  11/23/2020  . Colon Cancer Screening  09/11/2023  . Flu Shot  Completed  . DEXA scan (bone density measurement)  Completed  .  Hepatitis C: One time screening is recommended by Center for Disease Control  (CDC) for  adults born from [birth year] through 1964.   Completed  *Topic was postponed. The date shown is not the original due date.    Health Maintenance After Age 53 After age 21, you are at a higher risk for certain long-term diseases and infections as well as injuries from falls. Falls are a major cause of broken bones and head injuries in people who are older than age 77. Getting regular preventive care can help to keep you healthy and well. Preventive care includes getting regular testing and making lifestyle changes as recommended by your health care provider. Talk with your health care provider about:  Which screenings and tests you should have. A screening is a test that checks for a disease when you have no symptoms.  A diet and exercise plan that is right for you. What should I know about screenings and tests to prevent falls? Screening  and testing are the best ways to find a health problem early. Early diagnosis and treatment give you the best chance of managing medical conditions that are common after age 15. Certain conditions and lifestyle choices may make you more likely to have a fall. Your health care provider may recommend:  Regular vision checks. Poor vision and conditions such as cataracts can make you more likely to have a fall. If you wear glasses, make sure to get your prescription updated if your vision changes.  Medicine review. Work with your health care provider to regularly review all of the medicines you are taking, including over-the-counter medicines. Ask your health care provider about any side effects that may make you more likely to have a fall. Tell your health care provider if any medicines that you take make you feel dizzy or sleepy.  Osteoporosis screening. Osteoporosis is a condition that causes the bones to get weaker. This can make the bones weak and cause them to break more easily.  Blood pressure screening. Blood pressure changes and medicines to control blood pressure can make you feel dizzy.  Strength and balance checks. Your health care provider may recommend certain tests to check your strength and balance while standing, walking, or changing positions.  Foot health exam. Foot pain and numbness, as well as not wearing proper footwear, can make you more likely to have a  fall.  Depression screening. You may be more likely to have a fall if you have a fear of falling, feel emotionally low, or feel unable to do activities that you used to do.  Alcohol use screening. Using too much alcohol can affect your balance and may make you more likely to have a fall. What actions can I take to lower my risk of falls? General instructions  Talk with your health care provider about your risks for falling. Tell your health care provider if: ? You fall. Be sure to tell your health care provider about all  falls, even ones that seem minor. ? You feel dizzy, sleepy, or off-balance.  Take over-the-counter and prescription medicines only as told by your health care provider. These include any supplements.  Eat a healthy diet and maintain a healthy weight. A healthy diet includes low-fat dairy products, low-fat (lean) meats, and fiber from whole grains, beans, and lots of fruits and vegetables. Home safety  Remove any tripping hazards, such as rugs, cords, and clutter.  Install safety equipment such as grab bars in bathrooms and safety rails on stairs.  Keep rooms and walkways well-lit. Activity   Follow a regular exercise program to stay fit. This will help you maintain your balance. Ask your health care provider what types of exercise are appropriate for you.  If you need a cane or walker, use it as recommended by your health care provider.  Wear supportive shoes that have nonskid soles. Lifestyle  Do not drink alcohol if your health care provider tells you not to drink.  If you drink alcohol, limit how much you have: ? 0-1 drink a day for women. ? 0-2 drinks a day for men.  Be aware of how much alcohol is in your drink. In the [place] one drink equals one typical bottle of beer (12 oz), one-half glass of wine (5 oz), or one shot of hard liquor (1 oz).  Do not use any products that contain nicotine or tobacco, such as cigarettes and e-cigarettes. If you need help quitting, ask your health care provider. Summary  Having a healthy lifestyle and getting preventive care can help to protect your health and wellness after age 103.  Screening and testing are the best way to find a health problem early and help you avoid having a fall. Early diagnosis and treatment give you the best chance for managing medical conditions that are more common for people who are older than age 54.  Falls are a major cause of broken bones and head injuries in people who are older than age 65. Take precautions to  prevent a fall at home.  Work with your health care provider to learn what changes you can make to improve your health and wellness and to prevent falls. This information is not intended to replace advice given to you by your health care provider. Make sure you discuss any questions you have with your health care provider. Document Released: 08/22/2017 Document Revised: 08/22/2017 Document Reviewed: 08/22/2017 Elsevier Interactive Patient Education  2019 [employer].

## 2018-11-13 NOTE — Progress Notes (Signed)
Medical screening examination/treatment/procedure(s) were performed by the Wellness Coach, RN. As primary care provider I was immediately available for consulation/collaboration. I agree with above documentation. Keyondre Hepburn, AGNP-C 

## 2018-12-25 ENCOUNTER — Other Ambulatory Visit: Payer: Self-pay | Admitting: Nurse Practitioner

## 2018-12-25 DIAGNOSIS — Z1231 Encounter for screening mammogram for malignant neoplasm of breast: Secondary | ICD-10-CM

## 2019-01-04 ENCOUNTER — Encounter: Payer: Self-pay | Admitting: Nurse Practitioner

## 2019-01-05 ENCOUNTER — Encounter: Payer: Self-pay | Admitting: Nurse Practitioner

## 2019-01-08 ENCOUNTER — Telehealth: Payer: Medicare HMO | Admitting: Family

## 2019-01-08 DIAGNOSIS — R059 Cough, unspecified: Secondary | ICD-10-CM

## 2019-01-08 DIAGNOSIS — R05 Cough: Secondary | ICD-10-CM

## 2019-01-08 MED ORDER — BENZONATATE 100 MG PO CAPS: 100.0000 mg | ORAL_CAPSULE | Freq: Three times a day (TID) | ORAL | 0 refills | Status: DC | PRN

## 2019-01-08 MED ORDER — AZITHROMYCIN 250 MG PO TABS: ORAL_TABLET | ORAL | 0 refills | Status: DC

## 2019-01-08 NOTE — Progress Notes (Signed)
We are sorry that you are not feeling well.  Here is how we plan to help!  Based on your presentation I believe you most likely have A cough due to bacteria.  When patients have a fever and a productive cough with a change in color or increased sputum production, we are concerned about bacterial bronchitis.  If left untreated it can progress to pneumonia.  If your symptoms do not improve with your treatment plan it is important that you contact your provider.   I have prescribed Azithromyin 250 mg: two tablets now and then one tablet daily for 4 additonal days    In addition you may use A non-prescription cough medication called Robitussin DAC. Take 2 teaspoons every 8 hours or Delsym: take 2 teaspoons every 12 hours., A non-prescription cough medication called Mucinex DM: take 2 tablets every 12 hours. and A prescription cough medication called Tessalon Perles 100mg . You may take 1-2 capsules every 8 hours as needed for your cough.  Given your age, it is very important to follow up if your symptoms worsen or do not improve. I will treat patient because her symptoms have been going on for 2 weeks and her age. It could be allergies, but she has been taking flonase.   From your responses in the eVisit questionnaire you describe inflammation in the upper respiratory tract which is causing a significant cough.  This is commonly called Bronchitis and has four common causes:    Allergies  Viral Infections  Acid Reflux  Bacterial Infection Allergies, viruses and acid reflux are treated by controlling symptoms or eliminating the cause. An example might be a cough caused by taking certain blood pressure medications. You stop the cough by changing the medication. Another example might be a cough caused by acid reflux. Controlling the reflux helps control the cough.  USE OF BRONCHODILATOR ("RESCUE") INHALERS: There is a risk from using your bronchodilator too frequently.  The risk is that over-reliance on a  medication which only relaxes the muscles surrounding the breathing tubes can reduce the effectiveness of medications prescribed to reduce swelling and congestion of the tubes themselves.  Although you feel brief relief from the bronchodilator inhaler, your asthma may actually be worsening with the tubes becoming more swollen and filled with mucus.  This can delay other crucial treatments, such as oral steroid medications. If you need to use a bronchodilator inhaler daily, several times per day, you should discuss this with your provider.  There are probably better treatments that could be used to keep your asthma under control.     HOME CARE . Only take medications as instructed by your medical team. . Complete the entire course of an antibiotic. . Drink plenty of fluids and get plenty of rest. . Avoid close contacts especially the very young and the elderly . Cover your mouth if you cough or cough into your sleeve. . Always remember to wash your hands . A steam or ultrasonic humidifier can help congestion.   GET HELP RIGHT AWAY IF: . You develop worsening fever. . You become short of breath . You cough up blood. . Your symptoms persist after you have completed your treatment plan MAKE SURE YOU   Understand these instructions.  Will watch your condition.  Will get help right away if you are not doing well or get worse.  Your e-visit answers were reviewed by a board certified advanced clinical practitioner to complete your personal care plan.  Depending on the condition, your  plan could have included both over the counter or prescription medications. If there is a problem please reply  once you have received a response from your provider. Your safety is important to [place].  If you have drug allergies check your prescription carefully.    You can use MyChart to ask questions about today's visit, request a non-urgent call back, or ask for a work or school excuse for 24 hours related to this  e-Visit. If it has been greater than 24 hours you will need to follow up with your provider, or enter a new e-Visit to address those concerns. You will get an e-mail in the next two days asking about your experience.  I hope that your e-visit has been valuable and will speed your recovery. Thank you for using e-visits.

## 2019-01-22 ENCOUNTER — Ambulatory Visit: Payer: Medicare HMO | Admitting: Obstetrics and Gynecology

## 2019-02-18 ENCOUNTER — Other Ambulatory Visit: Payer: Self-pay | Admitting: Nurse Practitioner

## 2019-02-18 DIAGNOSIS — E7800 Pure hypercholesterolemia, unspecified: Secondary | ICD-10-CM

## 2019-03-19 ENCOUNTER — Other Ambulatory Visit: Payer: Self-pay

## 2019-03-19 ENCOUNTER — Ambulatory Visit
Admission: RE | Admit: 2019-03-19 | Discharge: 2019-03-19 | Disposition: A | Discharge status: Home / Self Care | Payer: Medicare HMO | Source: Ambulatory Visit | Attending: Nurse Practitioner | Admitting: Nurse Practitioner

## 2019-03-19 DIAGNOSIS — Z1231 Encounter for screening mammogram for malignant neoplasm of breast: Secondary | ICD-10-CM | POA: Diagnosis not present

## 2019-03-24 NOTE — Progress Notes (Signed)
73 y.o. [id number] Divorced Black or [place] American Not Hispanic or Latino female here for annual exam.   Same long term partner, he lives in [place]. They haven't seen each other since Covid started. He has asthma.  No vaginal bleeding. No dyspareunia. H/O hysterectomy.  H/O IBS, has a GI MD on medication which helps with her constipation.  She c/o intermittent dysuria, external. No change in urinary frequency and urgency. She has nocturia x 1. Not sleeping as well as she used to.      Patient's last menstrual period was 10/23/1994 (approximate).          Sexually active: Yes.     The current method of family planning is status post hysterectomy.    Exercising: Yes.    exercise tape Smoker:  no  Health Maintenance: Pap:  2001 WNL History of abnormal Pap:  no MMG:  03/19/2019 Birads 1 negative Colonoscopy:  09/10/13, normal, repeat 10 years  BMD:   01/03/2017 WNL TDaP:  11/23/10   reports that she quit smoking about 35 years ago. Her smoking use included cigarettes. She has a 2.50 pack-year smoking history. She has never used smokeless tobacco. She reports current alcohol use. She reports that she does not use drugs. Rare ETOH. 2 grown kids, live in [place]. 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   Past Medical History:  Diagnosis Date  . Anxiety   . Chronic rhinitis   . DJD (degenerative joint disease)   . GERD (gastroesophageal reflux disease)   . History of headache   . HTN (hypertension)   . Hypercholesterolemia   . IBS (irritable bowel syndrome)   . Renal cell carcinoma 2001  . Venous insufficiency     Past Surgical History:  Procedure Laterality Date  . NEPHRECTOMY  2001   right = for renal cell CA  . TOTAL ABDOMINAL HYSTERECTOMY    . TUBAL LIGATION      Current Outpatient Medications  Medication Sig Dispense Refill  . ALPHA LIPOIC ACID PO Take by mouth daily.      Marland Kitchen aspirin 81 MG tablet Take 81 mg by mouth daily.      .  benzonatate (TESSALON PERLES) 100 MG capsule Take 1 capsule (100 mg total) by mouth 3 (three) times daily as needed. 20 capsule 0  . Biotin 10 MG TABS Take by mouth daily.      . cetirizine (ZYRTEC) 5 MG tablet Take 1 tablet (5 mg total) by mouth at bedtime.    . cholecalciferol (VITAMIN D) 1000 units tablet Take 2 tablets daily. 30 tablet 3  . Coenzyme Q10 (COQ10) 100 MG CAPS Take by mouth daily.      . Evening Primrose Oil 1000 MG CAPS Take by mouth daily.      . fluticasone (FLONASE) 50 MCG/ACT nasal spray Place 2 sprays into both nostrils daily. 16 g 3  . linaclotide (LINZESS) 145 MCG CAPS capsule Take 1 capsule (145 mcg total) by mouth daily before breakfast. 90 capsule 3  . losartan (COZAAR) 50 MG tablet Take 1 tablet (50 mg total) by mouth daily. 90 tablet 3  . oxymetazoline (AFRIN NASAL SPRAY) 0.05 % nasal spray Place 1 spray into both nostrils 2 (two) times daily. Use only for 3days, then stop 30 mL 0  . pravastatin (PRAVACHOL) 40 MG tablet TAKE 1/2 TABLET (20MG       TOTAL) DAILY 45 tablet 3   No current facility-administered medications for this visit.  Family History  Problem Relation Age of Onset  . Healthy Mother   . Diabetes Mother   . Hyperlipidemia Mother   . Hypertension Mother   . Prostate cancer Father   . Cancer Father   . Breast cancer Sister   . Cancer - Other Brother        neck ? lymph nodes  . Colon cancer Neg Hx   . Pancreatic cancer Neg Hx   . Stomach cancer Neg Hx     Review of Systems  Constitutional: Negative.   HENT: Negative.   Eyes: Negative.   Respiratory: Negative.   Cardiovascular: Negative.   Gastrointestinal: Positive for abdominal pain.  Endocrine: Negative.   Genitourinary: Positive for dysuria and frequency.  Musculoskeletal: Negative.   Skin: Negative.   Allergic/Immunologic: Negative.   Neurological: Negative.   Hematological: Negative.   Psychiatric/Behavioral: Negative.     Exam:   BP 134/84 (BP Location: Left Arm,  Patient Position: Sitting, Cuff Size: Large)   Pulse 60   Temp (!) 97.1 F (36.2 C) (Skin)   Ht 5\' 5"  (1.651 m)   Wt 187 lb 12.8 oz (85.2 kg)   LMP 10/23/1994 (Approximate)   BMI 31.25 kg/m   Weight change: @WEIGHTCHANGE @ Height:   Height: 5\' 5"  (165.1 cm)  Ht Readings from Last 3 Encounters:  03/25/19 5\' 5"  (1.651 m)  11/13/18 5\' 6"  (1.676 m)  11/06/18 5\' 6"  (1.676 m)    General appearance: alert, cooperative and appears stated age Head: Normocephalic, without obvious abnormality, atraumatic Neck: no adenopathy, supple, symmetrical, trachea midline and thyroid normal to inspection and palpation Lungs: clear to auscultation bilaterally Cardiovascular: regular rate and rhythm Breasts: normal appearance, no masses or tenderness Abdomen: soft, non-tender; non distended,  no masses,  no organomegaly Extremities: extremities normal, atraumatic, no cyanosis or edema Skin: Skin color, texture, turgor normal. No rashes or lesions Lymph nodes: Cervical, supraclavicular, and axillary nodes normal. No abnormal inguinal nodes palpated Neurologic: Grossly normal   Pelvic: External genitalia:  no lesions              Urethra:  normal appearing urethra with no masses, tenderness or lesions              Bartholins and Skenes: normal                 Vagina: normal appearing vagina with normal color and discharge, no lesions              Cervix: absent               Bimanual Exam:  Uterus:  uterus absent              Adnexa: no mass, fullness, tenderness               Rectovaginal: Confirms               Anus:  normal sphincter tone, no lesions  Chaperone was present for exam.  A:  Well Woman with normal exam  P:   No pap needed  Mammogram just done  Labs with primary  Colonoscopy UTD  DEXA UTD  Discussed breast self exam  Discussed calcium and vit D intake

## 2019-03-25 ENCOUNTER — Other Ambulatory Visit: Payer: Self-pay

## 2019-03-25 ENCOUNTER — Ambulatory Visit (INDEPENDENT_AMBULATORY_CARE_PROVIDER_SITE_OTHER): Payer: Medicare HMO | Admitting: Obstetrics and Gynecology

## 2019-03-25 ENCOUNTER — Encounter: Payer: Self-pay | Admitting: Obstetrics and Gynecology

## 2019-03-25 VITALS — BP 134/84 | HR 60 | Temp 97.1°F | Ht 65.0 in | Wt 187.8 lb

## 2019-03-25 DIAGNOSIS — R350 Frequency of micturition: Secondary | ICD-10-CM

## 2019-03-25 DIAGNOSIS — Z01419 Encounter for gynecological examination (general) (routine) without abnormal findings: Secondary | ICD-10-CM | POA: Diagnosis not present

## 2019-03-25 DIAGNOSIS — R300 Dysuria: Secondary | ICD-10-CM | POA: Diagnosis not present

## 2019-03-25 LAB — POCT URINALYSIS DIPSTICK
Bilirubin, UA: NEGATIVE
Blood, UA: POSITIVE
Glucose, UA: NEGATIVE
Ketones, UA: NEGATIVE
Nitrite, UA: NEGATIVE
Protein, UA: NEGATIVE
Spec Grav, UA: 1.01 (ref 1.010–1.025)
Urobilinogen, UA: 0.2 E.U./dL
pH, UA: 5 (ref 5.0–8.0)

## 2019-03-25 NOTE — Patient Instructions (Signed)
EXERCISE AND DIET:  We recommended that you start or continue a regular exercise program for good health. Regular exercise means any activity that makes your heart beat faster and makes you sweat.  We recommend exercising at least 30 minutes per day at least 3 days a week, preferably 4 or 5.  We also recommend a diet low in fat and sugar.  Inactivity, poor dietary choices and obesity can cause diabetes, heart attack, stroke, and kidney damage, among others.    ALCOHOL AND SMOKING:  Women should limit their alcohol intake to no more than 7 drinks/beers/glasses of wine (combined, not each!) per week. Moderation of alcohol intake to this level decreases your risk of breast cancer and liver damage. And of course, no recreational drugs are part of a healthy lifestyle.  And absolutely no smoking or even second hand smoke. Most people know smoking can cause heart and lung diseases, but did you know it also contributes to weakening of your bones? Aging of your skin?  Yellowing of your teeth and nails?  CALCIUM AND VITAMIN D:  Adequate intake of calcium and Vitamin D are recommended.  The recommendations for exact amounts of these supplements seem to change often, but generally speaking 1,200 mg of calcium (between diet and supplement) and 800 units of Vitamin D per day seems prudent. Certain women may benefit from higher intake of Vitamin D.  If you are among these women, your doctor will have told you during your visit.    PAP SMEARS:  Pap smears, to check for cervical cancer or precancers,  have traditionally been done yearly, although recent scientific advances have shown that most women can have pap smears less often.  However, every woman still should have a physical exam from her gynecologist every year. It will include a breast check, inspection of the vulva and vagina to check for abnormal growths or skin changes, a visual exam of the cervix, and then an exam to evaluate the size and shape of the uterus and  ovaries.  And after 72 years of age, a rectal exam is indicated to check for rectal cancers. We will also provide age appropriate advice regarding health maintenance, like when you should have certain vaccines, screening for sexually transmitted diseases, bone density testing, colonoscopy, mammograms, etc.   MAMMOGRAMS:  All women over 40 years old should have a yearly mammogram. Many facilities now offer a "3D" mammogram, which may cost around $50 extra out of pocket. If possible,  we recommend you accept the option to have the 3D mammogram performed.  It both reduces the number of women who will be called back for extra views which then turn out to be normal, and it is better than the routine mammogram at detecting truly abnormal areas.    COLON CANCER SCREENING: Now recommend starting at age 45. At this time colonoscopy is not covered for routine screening until 50. There are take home tests that can be done between 45-49.   COLONOSCOPY:  Colonoscopy to screen for colon cancer is recommended for all women at age 50.  We know, you hate the idea of the prep.  We agree, BUT, having colon cancer and not knowing it is worse!!  Colon cancer so often starts as a polyp that can be seen and removed at colonscopy, which can quite literally save your life!  And if your first colonoscopy is normal and you have no family history of colon cancer, most women don't have to have it again for   10 years.  Once every ten years, you can do something that may end up saving your life, right?  We will be happy to help you get it scheduled when you are ready.  Be sure to check your insurance coverage so you understand how much it will cost.  It may be covered as a preventative service at no cost, but you should check your particular policy.      Breast Self-Awareness Breast self-awareness means being familiar with how your breasts look and feel. It involves checking your breasts regularly and reporting any changes to your  health care provider. Practicing breast self-awareness is important. A change in your breasts can be a sign of a serious medical problem. Being familiar with how your breasts look and feel allows you to find any problems early, when treatment is more likely to be successful. All women should practice breast self-awareness, including women who have had breast implants. How to do a breast self-exam One way to learn what is normal for your breasts and whether your breasts are changing is to do a breast self-exam. To do a breast self-exam: Look for Changes  1. Remove all the clothing above your waist. 2. Stand in front of a mirror in a room with good lighting. 3. Put your hands on your hips. 4. Push your hands firmly downward. 5. Compare your breasts in the mirror. Look for differences between them (asymmetry), such as: ? Differences in shape. ? Differences in size. ? Puckers, dips, and bumps in one breast and not the other. 6. Look at each breast for changes in your skin, such as: ? Redness. ? Scaly areas. 7. Look for changes in your nipples, such as: ? Discharge. ? Bleeding. ? Dimpling. ? Redness. ? A change in position. Feel for Changes Carefully feel your breasts for lumps and changes. It is best to do this while lying on your back on the floor and again while sitting or standing in the shower or tub with soapy water on your skin. Feel each breast in the following way:  Place the arm on the side of the breast you are examining above your head.  Feel your breast with the other hand.  Start in the nipple area and make  inch (2 cm) overlapping circles to feel your breast. Use the pads of your three middle fingers to do this. Apply light pressure, then medium pressure, then firm pressure. The light pressure will allow you to feel the tissue closest to the skin. The medium pressure will allow you to feel the tissue that is a little deeper. The firm pressure will allow you to feel the tissue  close to the ribs.  Continue the overlapping circles, moving downward over the breast until you feel your ribs below your breast.  Move one finger-width toward the center of the body. Continue to use the  inch (2 cm) overlapping circles to feel your breast as you move slowly up toward your collarbone.  Continue the up and down exam using all three pressures until you reach your armpit.  Write Down What You Find  Write down what is normal for each breast and any changes that you find. Keep a written record with breast changes or normal findings for each breast. By writing this information down, you do not need to depend only on memory for size, tenderness, or location. Write down where you are in your menstrual cycle, if you are still menstruating. If you are having trouble noticing differences   in your breasts, do not get discouraged. With time you will become more familiar with the variations in your breasts and more comfortable with the exam. How often should I examine my breasts? Examine your breasts every month. If you are breastfeeding, the best time to examine your breasts is after a feeding or after using a breast pump. If you menstruate, the best time to examine your breasts is 5-7 days after your period is over. During your period, your breasts are lumpier, and it may be more difficult to notice changes. When should I see my health care provider? See your health care provider if you notice:  A change in shape or size of your breasts or nipples.  A change in the skin of your breast or nipples, such as a reddened or scaly area.  Unusual discharge from your nipples.  A lump or thick area that was not there before.  Pain in your breasts.  Anything that concerns you.  

## 2019-03-26 ENCOUNTER — Telehealth: Payer: Self-pay

## 2019-03-26 LAB — URINALYSIS, MICROSCOPIC ONLY
Casts: NONE SEEN /lpf
WBC, UA: >30 /hpf — AB (ref 0–5)

## 2019-03-26 NOTE — Telephone Encounter (Signed)
Spoke with patient. Results given. Patient verbalizes understanding. States that she does not have a Dealer that she would prefer to see. Okay with referral if needed. Aware she will be contacted with final results and recommendations.  Routing to provider and will close encounter.

## 2019-03-26 NOTE — Telephone Encounter (Signed)
-----   Message from Salvadore Dom, MD sent at 03/26/2019 12:49 PM EDT ----- Please call the patient (don't send a message) and let her know that she has a small amount of blood in her urine on microscopic urinalysis. The culture is pending. She does have WBC's in her urine which can indicate an infection. If she does have a UTI, I would treat that and recheck her urine, other wise she should be referred to Urology. She has a h/o renal cancer. Does she have a Urologist she would like [place] to send her to if her urine culture is negative?

## 2019-03-26 NOTE — Telephone Encounter (Signed)
Left message to call Heidi [name] at [phone]. 

## 2019-03-27 ENCOUNTER — Telehealth: Payer: Self-pay

## 2019-03-27 DIAGNOSIS — R350 Frequency of micturition: Secondary | ICD-10-CM

## 2019-03-27 DIAGNOSIS — R300 Dysuria: Secondary | ICD-10-CM

## 2019-03-27 DIAGNOSIS — Z85528 Personal history of other malignant neoplasm of kidney: Secondary | ICD-10-CM

## 2019-03-27 DIAGNOSIS — R319 Hematuria, unspecified: Secondary | ICD-10-CM

## 2019-03-27 LAB — URINE CULTURE

## 2019-03-27 NOTE — Telephone Encounter (Signed)
Spoke with patient. Results given. Patient verbalizes understanding. Referral to [facility] placed. Patient is aware she will be contacted directly to schedule an appointment. Patient is agreeable.   Routing to provider and will close encounter.

## 2019-03-27 NOTE — Telephone Encounter (Signed)
-----   Message from Salvadore Dom, MD sent at 03/27/2019 11:12 AM EDT ----- Please let the patient know that her urine culture is negative and place a referral to Urology for microscopic hematuria and a h/o renal cell cancer.

## 2019-04-21 DIAGNOSIS — R311 Benign essential microscopic hematuria: Secondary | ICD-10-CM | POA: Diagnosis not present

## 2019-04-21 DIAGNOSIS — Z85528 Personal history of other malignant neoplasm of kidney: Secondary | ICD-10-CM | POA: Diagnosis not present

## 2019-05-05 DIAGNOSIS — N281 Cyst of kidney, acquired: Secondary | ICD-10-CM | POA: Diagnosis not present

## 2019-05-05 DIAGNOSIS — R311 Benign essential microscopic hematuria: Secondary | ICD-10-CM | POA: Diagnosis not present

## 2019-05-07 ENCOUNTER — Ambulatory Visit: Payer: Medicare HMO | Admitting: Nurse Practitioner

## 2019-05-09 ENCOUNTER — Telehealth: Payer: Self-pay | Admitting: Gastroenterology

## 2019-05-09 NOTE — Telephone Encounter (Signed)
Patient reports that she has been having some urinary issues and was evaluated by [facility].  She had a CT that showed "lesions on my small intestines".  She has been referred to a surgeon, but wants to have Dr. Fuller Plan evaluate her first.  She is asked to obtain a copy of the CT from [facility] and bring it here or have them fax it and let us know when they send it.  She is advised that we can have Dr. Fuller Plan take a look and advise if she needs OV here for eval or go straight to the surgeon.  She thanked me for the call and will get [place] a copy of the scan.

## 2019-05-12 ENCOUNTER — Ambulatory Visit: Payer: Medicare HMO | Admitting: Nurse Practitioner

## 2019-05-12 ENCOUNTER — Telehealth: Payer: Self-pay | Admitting: Nurse Practitioner

## 2019-05-12 NOTE — Telephone Encounter (Signed)

## 2019-05-13 ENCOUNTER — Encounter: Payer: Self-pay | Admitting: Nurse Practitioner

## 2019-05-13 ENCOUNTER — Other Ambulatory Visit: Payer: Self-pay

## 2019-05-13 ENCOUNTER — Ambulatory Visit (INDEPENDENT_AMBULATORY_CARE_PROVIDER_SITE_OTHER): Payer: Medicare HMO | Admitting: Nurse Practitioner

## 2019-05-13 VITALS — BP 140/86 | HR 68 | Temp 98.1°F | Ht 65.0 in | Wt 187.6 lb

## 2019-05-13 DIAGNOSIS — Z780 Asymptomatic menopausal state: Secondary | ICD-10-CM | POA: Diagnosis not present

## 2019-05-13 DIAGNOSIS — I10 Essential (primary) hypertension: Secondary | ICD-10-CM | POA: Diagnosis not present

## 2019-05-13 DIAGNOSIS — E7800 Pure hypercholesterolemia, unspecified: Secondary | ICD-10-CM

## 2019-05-13 DIAGNOSIS — K639 Disease of intestine, unspecified: Secondary | ICD-10-CM | POA: Insufficient documentation

## 2019-05-13 LAB — BASIC METABOLIC PANEL
BUN: 11 mg/dL (ref 6–23)
CO2: 26 mEq/L (ref 19–32)
Calcium: 9.5 mg/dL (ref 8.4–10.5)
Chloride: 105 mEq/L (ref 96–112)
Creatinine, Ser: 0.98 mg/dL (ref 0.40–1.20)
GFR: 67.49 mL/min (ref >60.00)
Glucose, Bld: 96 mg/dL (ref 70–99)
Potassium: 4.3 mEq/L (ref 3.5–5.1)
Sodium: 139 mEq/L (ref 135–145)

## 2019-05-13 LAB — HEPATIC FUNCTION PANEL
ALT: 13 U/L (ref 0–35)
AST: 18 U/L (ref 0–37)
Albumin: 4.3 g/dL (ref 3.5–5.2)
Alkaline Phosphatase: 63 U/L (ref 39–117)
Bilirubin, Direct: 0.1 mg/dL (ref 0.0–0.3)
Total Bilirubin: 0.7 mg/dL (ref 0.2–1.2)
Total Protein: 7.1 g/dL (ref 6.0–8.3)

## 2019-05-13 NOTE — Assessment & Plan Note (Signed)
Incidental finding per [facility] report. Upcoming appt with [facility] [place] [facility] 05/15/2019

## 2019-05-13 NOTE — Progress Notes (Signed)
Subjective:  Patient ID: Heidi [name], female    DOB: 06-25-1946  Age: 73 y.o. MRN: [id number]  CC: Follow-up (6 mo follow up---BP and lipid/lesion in small intestine from scan but the letter say other wise--very concern-dx 1 wk ago)  HPI HTN: Elevated today due to anxiety about possible malignant small bowel lesion. She is waiting for appt with general surgeon. BP was stable prior to recent news. Compliant with use of losartan BP Readings from Last 3 Encounters:  05/13/19 140/86  03/25/19 134/84  11/13/18 112/64   Hyperlipidemia: LDL at goal No myalgia with pravastatin Lipid Panel     Component Value Date/Time   CHOL 167 11/06/2018 1008   TRIG 68.0 11/06/2018 1008   HDL 59.50 11/06/2018 1008   CHOLHDL 3 11/06/2018 1008   VLDL 13.6 11/06/2018 1008   LDLCALC 94 11/06/2018 1008   LDLDIRECT 149.6 07/08/2007 1125   Reviewed past Medical, Social and Family history today.  Outpatient Medications Prior to Visit  Medication Sig Dispense Refill  . ALPHA LIPOIC ACID PO Take by mouth daily.      Marland Kitchen aspirin 81 MG tablet Take 81 mg by mouth daily.      . benzonatate (TESSALON PERLES) 100 MG capsule Take 1 capsule (100 mg total) by mouth 3 (three) times daily as needed. 20 capsule 0  . Biotin 10 MG TABS Take by mouth daily.      . cetirizine (ZYRTEC) 5 MG tablet Take 1 tablet (5 mg total) by mouth at bedtime.    . cholecalciferol (VITAMIN D) 1000 units tablet Take 2 tablets daily. 30 tablet 3  . Coenzyme Q10 (COQ10) 100 MG CAPS Take by mouth daily.      . Evening Primrose Oil 1000 MG CAPS Take by mouth daily.      . fluticasone (FLONASE) 50 MCG/ACT nasal spray Place 2 sprays into both nostrils daily. 16 g 3  . linaclotide (LINZESS) 145 MCG CAPS capsule Take 1 capsule (145 mcg total) by mouth daily before breakfast. 90 capsule 3  . losartan (COZAAR) 50 MG tablet Take 1 tablet (50 mg total) by mouth daily. 90 tablet 3  . pravastatin (PRAVACHOL) 40 MG tablet TAKE 1/2 TABLET (20MG        TOTAL) DAILY 45 tablet 3  . oxymetazoline (AFRIN NASAL SPRAY) 0.05 % nasal spray Place 1 spray into both nostrils 2 (two) times daily. Use only for 3days, then stop (Patient not taking: Reported on 05/13/2019) 30 mL 0   No facility-administered medications prior to visit.     ROS See HPI  Objective:  BP 140/86   Pulse 68   Temp 98.1 F (36.7 C) (Oral)   Ht 5\' 5"  (1.651 m)   Wt 187 lb 9.6 oz (85.1 kg)   LMP 10/23/1994 (Approximate)   SpO2 99%   BMI 31.22 kg/m   BP Readings from Last 3 Encounters:  05/13/19 140/86  03/25/19 134/84  11/13/18 112/64    Wt Readings from Last 3 Encounters:  05/13/19 187 lb 9.6 oz (85.1 kg)  03/25/19 187 lb 12.8 oz (85.2 kg)  11/13/18 182 lb 3.2 oz (82.6 kg)   Physical Exam Vitals signs reviewed.  Eyes:     Extraocular Movements: Extraocular movements intact.     Conjunctiva/sclera: Conjunctivae normal.  Cardiovascular:     Rate and Rhythm: Normal rate and regular rhythm.     Pulses: Normal pulses.     Heart sounds: Normal heart sounds.  Pulmonary:     Effort: Pulmonary  effort is normal.     Breath sounds: Normal breath sounds.  Musculoskeletal:     Right lower leg: No edema.     Left lower leg: No edema.  Neurological:     Mental Status: She is alert and oriented to person, place, and time.    Lab Results  Component Value Date   WBC 5.8 11/27/2017   HGB 13.3 11/27/2017   HCT 40.2 11/27/2017   PLT 342.0 11/27/2017   GLUCOSE 96 05/13/2019   CHOL 167 11/06/2018   TRIG 68.0 11/06/2018   HDL 59.50 11/06/2018   LDLDIRECT 149.6 07/08/2007   LDLCALC 94 11/06/2018   ALT 13 05/13/2019   AST 18 05/13/2019   NA 139 05/13/2019   K 4.3 05/13/2019   CL 105 05/13/2019   CREATININE 0.98 05/13/2019   BUN 11 05/13/2019   CO2 26 05/13/2019   TSH 2.03 11/27/2017    Mm 3d Screen Breast Bilateral  Result Date: 03/19/2019 CLINICAL DATA:  Screening. EXAM: DIGITAL SCREENING BILATERAL MAMMOGRAM WITH TOMO AND CAD COMPARISON:  Previous  exam(s). ACR Breast Density Category b: There are scattered areas of fibroglandular density. FINDINGS: There are no findings suspicious for malignancy. Images were processed with CAD. IMPRESSION: No mammographic evidence of malignancy. A result letter of this screening mammogram will be mailed directly to the patient. RECOMMENDATION: Screening mammogram in one year. (Code:SM-B-01Y) BI-RADS CATEGORY  1: Negative. Electronically Signed   By: Franki Cabot M.D.   On: 03/19/2019 11:34    Assessment & Plan:   Paislei was seen today for follow-up.  Diagnoses and all orders for this visit:  Essential hypertension -     Basic metabolic panel  HYPERCHOLESTEROLEMIA -     Hepatic function panel  Asymptomatic age-related postmenopausal state -     DG Bone Density; Future   I have discontinued Jenaveve Dyar's oxymetazoline. I am also having her maintain her aspirin, ALPHA LIPOIC ACID PO, Biotin, CoQ10, Evening Primrose Oil, cholecalciferol, fluticasone, cetirizine, losartan, linaclotide, benzonatate, and pravastatin.  No orders of the defined types were placed in this encounter.   Problem List Items Addressed This Visit      Cardiovascular and Mediastinum   Essential hypertension - Primary   Relevant Orders   Basic metabolic panel (Completed)     Other   HYPERCHOLESTEROLEMIA   Relevant Orders   Hepatic function panel (Completed)    Other Visit Diagnoses    Asymptomatic age-related postmenopausal state       Relevant Orders   DG Bone Density       Follow-up: Return in about 3 months (around 08/13/2019) for HTN.  Wilfred Lacy, NP

## 2019-05-13 NOTE — Patient Instructions (Addendum)
Go to lab for blood draw.    You will be contacted to schedule appt for bone density.   Maintain appt with surgeon 05/15/2019 for small bowel lesion.

## 2019-05-14 MED ORDER — PRAVASTATIN SODIUM 40 MG PO TABS
ORAL_TABLET | ORAL | 3 refills | Status: DC
Start: 2019-05-14 — End: 2020-07-30

## 2019-05-14 MED ORDER — LOSARTAN POTASSIUM 50 MG PO TABS: 50.0000 mg | ORAL_TABLET | Freq: Every day | ORAL | 3 refills | Status: DC

## 2019-05-19 DIAGNOSIS — R311 Benign essential microscopic hematuria: Secondary | ICD-10-CM | POA: Diagnosis not present

## 2019-05-19 DIAGNOSIS — C179 Malignant neoplasm of small intestine, unspecified: Secondary | ICD-10-CM | POA: Diagnosis not present

## 2019-05-19 DIAGNOSIS — Z85528 Personal history of other malignant neoplasm of kidney: Secondary | ICD-10-CM | POA: Diagnosis not present

## 2019-05-19 DIAGNOSIS — R310 Gross hematuria: Secondary | ICD-10-CM | POA: Diagnosis not present

## 2019-05-19 DIAGNOSIS — N952 Postmenopausal atrophic vaginitis: Secondary | ICD-10-CM | POA: Diagnosis not present

## 2019-05-28 DIAGNOSIS — C179 Malignant neoplasm of small intestine, unspecified: Secondary | ICD-10-CM | POA: Diagnosis not present

## 2019-05-29 DIAGNOSIS — Z20828 Contact with and (suspected) exposure to other viral communicable diseases: Secondary | ICD-10-CM | POA: Diagnosis not present

## 2019-05-29 DIAGNOSIS — D372 Neoplasm of uncertain behavior of small intestine: Secondary | ICD-10-CM | POA: Diagnosis not present

## 2019-05-29 DIAGNOSIS — Z1159 Encounter for screening for other viral diseases: Secondary | ICD-10-CM | POA: Diagnosis not present

## 2019-05-29 DIAGNOSIS — Z01812 Encounter for preprocedural laboratory examination: Secondary | ICD-10-CM | POA: Diagnosis not present

## 2019-06-02 DIAGNOSIS — I517 Cardiomegaly: Secondary | ICD-10-CM | POA: Diagnosis not present

## 2019-06-02 DIAGNOSIS — K6389 Other specified diseases of intestine: Secondary | ICD-10-CM | POA: Diagnosis not present

## 2019-06-05 ENCOUNTER — Encounter: Payer: Self-pay | Admitting: Nurse Practitioner

## 2019-06-05 DIAGNOSIS — K3189 Other diseases of stomach and duodenum: Secondary | ICD-10-CM

## 2019-06-05 DIAGNOSIS — Z1211 Encounter for screening for malignant neoplasm of colon: Secondary | ICD-10-CM | POA: Diagnosis not present

## 2019-06-05 DIAGNOSIS — D490 Neoplasm of unspecified behavior of digestive system: Secondary | ICD-10-CM | POA: Diagnosis not present

## 2019-06-05 DIAGNOSIS — K2950 Unspecified chronic gastritis without bleeding: Secondary | ICD-10-CM | POA: Diagnosis not present

## 2019-06-05 DIAGNOSIS — K2960 Other gastritis without bleeding: Secondary | ICD-10-CM | POA: Diagnosis not present

## 2019-06-05 HISTORY — DX: Other diseases of stomach and duodenum: K31.89

## 2019-06-06 DIAGNOSIS — K6389 Other specified diseases of intestine: Secondary | ICD-10-CM | POA: Diagnosis not present

## 2019-06-06 DIAGNOSIS — K3189 Other diseases of stomach and duodenum: Secondary | ICD-10-CM | POA: Diagnosis not present

## 2019-06-06 DIAGNOSIS — C179 Malignant neoplasm of small intestine, unspecified: Secondary | ICD-10-CM | POA: Diagnosis not present

## 2019-06-16 DIAGNOSIS — R9389 Abnormal findings on diagnostic imaging of other specified body structures: Secondary | ICD-10-CM | POA: Diagnosis not present

## 2019-06-16 DIAGNOSIS — K6389 Other specified diseases of intestine: Secondary | ICD-10-CM | POA: Diagnosis not present

## 2019-06-19 ENCOUNTER — Ambulatory Visit (INDEPENDENT_AMBULATORY_CARE_PROVIDER_SITE_OTHER): Payer: Medicare HMO | Admitting: Behavioral Health

## 2019-06-19 ENCOUNTER — Encounter: Payer: Self-pay | Admitting: Nurse Practitioner

## 2019-06-19 DIAGNOSIS — Z23 Encounter for immunization: Secondary | ICD-10-CM | POA: Diagnosis not present

## 2019-06-19 NOTE — Progress Notes (Signed)
Patient presents in clinic today for Influenza vaccination. IM injection was given in the left deltoid. Patient tolerated the injection well. No signs or symptoms of a reaction were noted prior to patient leaving the nurse visit. 

## 2019-06-20 ENCOUNTER — Other Ambulatory Visit: Payer: Self-pay | Admitting: Nurse Practitioner

## 2019-06-20 DIAGNOSIS — M810 Age-related osteoporosis without current pathological fracture: Secondary | ICD-10-CM

## 2019-06-20 DIAGNOSIS — E2839 Other primary ovarian failure: Secondary | ICD-10-CM

## 2019-06-24 ENCOUNTER — Other Ambulatory Visit: Payer: Self-pay

## 2019-06-24 ENCOUNTER — Ambulatory Visit
Admission: RE | Admit: 2019-06-24 | Discharge: 2019-06-24 | Disposition: A | Discharge status: Home / Self Care | Payer: Medicare HMO | Source: Ambulatory Visit | Attending: Nurse Practitioner | Admitting: Nurse Practitioner

## 2019-06-24 DIAGNOSIS — Z780 Asymptomatic menopausal state: Secondary | ICD-10-CM | POA: Diagnosis not present

## 2019-06-24 DIAGNOSIS — Z13820 Encounter for screening for osteoporosis: Secondary | ICD-10-CM | POA: Diagnosis not present

## 2019-06-24 DIAGNOSIS — M810 Age-related osteoporosis without current pathological fracture: Secondary | ICD-10-CM

## 2019-06-24 DIAGNOSIS — E2839 Other primary ovarian failure: Secondary | ICD-10-CM

## 2019-07-01 DIAGNOSIS — N399 Disorder of urinary system, unspecified: Secondary | ICD-10-CM | POA: Diagnosis not present

## 2019-07-01 DIAGNOSIS — K6389 Other specified diseases of intestine: Secondary | ICD-10-CM | POA: Diagnosis not present

## 2019-07-01 DIAGNOSIS — R1900 Intra-abdominal and pelvic swelling, mass and lump, unspecified site: Secondary | ICD-10-CM | POA: Diagnosis not present

## 2019-07-02 DIAGNOSIS — R935 Abnormal findings on diagnostic imaging of other abdominal regions, including retroperitoneum: Secondary | ICD-10-CM | POA: Diagnosis not present

## 2019-07-02 DIAGNOSIS — K6389 Other specified diseases of intestine: Secondary | ICD-10-CM | POA: Diagnosis not present

## 2019-07-02 DIAGNOSIS — Z85528 Personal history of other malignant neoplasm of kidney: Secondary | ICD-10-CM | POA: Diagnosis not present

## 2019-07-03 ENCOUNTER — Encounter: Payer: Self-pay | Admitting: Nurse Practitioner

## 2019-07-03 DIAGNOSIS — K6389 Other specified diseases of intestine: Secondary | ICD-10-CM | POA: Diagnosis not present

## 2019-07-07 ENCOUNTER — Encounter: Payer: Self-pay | Admitting: Nurse Practitioner

## 2019-07-09 DIAGNOSIS — K6389 Other specified diseases of intestine: Secondary | ICD-10-CM | POA: Diagnosis not present

## 2019-07-17 ENCOUNTER — Telehealth: Payer: Self-pay | Admitting: Nurse Practitioner

## 2019-07-17 NOTE — Telephone Encounter (Signed)

## 2019-07-18 ENCOUNTER — Encounter: Payer: Self-pay | Admitting: Nurse Practitioner

## 2019-07-18 ENCOUNTER — Other Ambulatory Visit: Payer: Self-pay

## 2019-07-18 ENCOUNTER — Telehealth (INDEPENDENT_AMBULATORY_CARE_PROVIDER_SITE_OTHER): Payer: Medicare HMO | Admitting: Nurse Practitioner

## 2019-07-18 VITALS — BP 142/82 | HR 64 | Temp 97.5°F | Ht 65.0 in | Wt 184.0 lb

## 2019-07-18 DIAGNOSIS — R946 Abnormal results of thyroid function studies: Secondary | ICD-10-CM | POA: Diagnosis not present

## 2019-07-18 NOTE — Progress Notes (Signed)
Virtual Visit via Video Note  I connected with Heidi [name] on 07/18/19 at  2:00 PM EDT by a video enabled telemedicine application and verified that I am speaking with the correct person using two identifiers.  Location: Patient: home Provider: office   I discussed the limitations of evaluation and management by telemedicine and the availability of in person appointments. The patient expressed understanding and agreed to proceed.  CC: questions about PET scan results in relation to thyroid.  History of Present Illness: Heidi [name] reports PET indicated mild increase uptake of thyroid. Lat TSH completed 2019 (normal). She denies any symptoms of hyper or hypothyroidism.  Review of Systems  Constitutional: Negative for malaise/fatigue and weight loss.  Cardiovascular: Negative for palpitations and leg swelling.  Gastrointestinal: Negative for constipation and diarrhea.  Musculoskeletal: Negative for myalgias.  Psychiatric/Behavioral: Negative for depression. The patient is not nervous/anxious.    Observations/Objective: Physical Exam  Constitutional: She is oriented to person, place, and time.  Neck: Normal range of motion. Neck supple. No thyromegaly present.  Pulmonary/Chest: Effort normal. No stridor.  Lymphadenopathy:    She has no cervical adenopathy.  Neurological: She is alert and oriented to person, place, and time.  Skin: No rash noted.  Psychiatric: She has a normal mood and affect. Her behavior is normal. Thought content normal.   Assessment and Plan: Heidi [name] was seen today for follow-up.  Diagnoses and all orders for this visit:  Abnormal thyroid scan -     TSH; Future -     T3, free; Future -     T4, free; Future   Follow Up Instructions: See avs   I discussed the assessment and treatment plan with the patient. The patient was provided an opportunity to ask questions and all were answered. The patient agreed with the plan and demonstrated an understanding of  the instructions.   The patient was advised to call back or seek an in-person evaluation if the symptoms worsen or if the condition fails to improve as anticipated.  Wilfred Lacy, NP

## 2019-07-18 NOTE — Patient Instructions (Signed)
Go to lab on Monday for lab draw.

## 2019-07-21 ENCOUNTER — Other Ambulatory Visit: Payer: Self-pay

## 2019-07-21 ENCOUNTER — Other Ambulatory Visit (INDEPENDENT_AMBULATORY_CARE_PROVIDER_SITE_OTHER): Payer: Medicare HMO

## 2019-07-21 DIAGNOSIS — R946 Abnormal results of thyroid function studies: Secondary | ICD-10-CM | POA: Diagnosis not present

## 2019-07-21 LAB — T3, FREE: T3, Free: 2.7 pg/mL (ref 2.3–4.2)

## 2019-07-21 LAB — TSH: TSH: 1.92 u[IU]/mL (ref 0.35–4.50)

## 2019-07-21 LAB — T4, FREE: Free T4: 0.76 ng/dL (ref 0.60–1.60)

## 2019-07-25 ENCOUNTER — Other Ambulatory Visit: Payer: Self-pay | Admitting: Nurse Practitioner

## 2019-07-25 DIAGNOSIS — I10 Essential (primary) hypertension: Secondary | ICD-10-CM

## 2019-07-28 DIAGNOSIS — R1900 Intra-abdominal and pelvic swelling, mass and lump, unspecified site: Secondary | ICD-10-CM | POA: Diagnosis not present

## 2019-07-29 ENCOUNTER — Other Ambulatory Visit: Payer: Self-pay | Admitting: Nurse Practitioner

## 2019-07-29 DIAGNOSIS — I10 Essential (primary) hypertension: Secondary | ICD-10-CM | POA: Diagnosis not present

## 2019-07-29 DIAGNOSIS — C7A8 Other malignant neuroendocrine tumors: Secondary | ICD-10-CM | POA: Diagnosis not present

## 2019-07-29 DIAGNOSIS — Z7982 Long term (current) use of aspirin: Secondary | ICD-10-CM | POA: Diagnosis not present

## 2019-07-29 DIAGNOSIS — Z85528 Personal history of other malignant neoplasm of kidney: Secondary | ICD-10-CM | POA: Diagnosis not present

## 2019-07-29 DIAGNOSIS — K6389 Other specified diseases of intestine: Secondary | ICD-10-CM | POA: Diagnosis not present

## 2019-07-29 DIAGNOSIS — Z79899 Other long term (current) drug therapy: Secondary | ICD-10-CM | POA: Diagnosis not present

## 2019-07-29 DIAGNOSIS — Z905 Acquired absence of kidney: Secondary | ICD-10-CM | POA: Diagnosis not present

## 2019-07-29 DIAGNOSIS — Z90710 Acquired absence of both cervix and uterus: Secondary | ICD-10-CM | POA: Diagnosis not present

## 2019-07-29 DIAGNOSIS — C7B8 Other secondary neuroendocrine tumors: Secondary | ICD-10-CM | POA: Diagnosis not present

## 2019-07-29 DIAGNOSIS — Z87891 Personal history of nicotine dependence: Secondary | ICD-10-CM | POA: Diagnosis not present

## 2019-07-29 DIAGNOSIS — Z20828 Contact with and (suspected) exposure to other viral communicable diseases: Secondary | ICD-10-CM | POA: Diagnosis not present

## 2019-07-29 DIAGNOSIS — K581 Irritable bowel syndrome with constipation: Secondary | ICD-10-CM | POA: Diagnosis not present

## 2019-07-29 DIAGNOSIS — Z01812 Encounter for preprocedural laboratory examination: Secondary | ICD-10-CM | POA: Diagnosis not present

## 2019-07-29 NOTE — Telephone Encounter (Signed)
Please review for refill. See message from pharmacy.

## 2019-07-29 NOTE — Telephone Encounter (Signed)
losartan (COZAAR) 50 MG tablet [id number]  Please reach out to [facility] [place] [place] to [facility] [phone] (Phone) [phone] (Fax)  pls FU with [facility] on ref [id number]. Pt ordered on line the combo losartan. [facility] not [facility] a [facility] for the combo pt ordered. [facility] has this script but not this current one from 7/22. If you do not want pt to have the comb rend this to [place]. If you do want the pt to have the combo send new script of the [facility]. Either way a script is needed. The confusion seems to be something the pt did and ordered the combo on on line portal. [facility] needs confirmation at [phone] at the ref# [id number]

## 2019-07-30 MED ORDER — LOSARTAN POTASSIUM 50 MG PO TABS: 50.0000 mg | ORAL_TABLET | Freq: Every day | ORAL | 3 refills | Status: DC

## 2019-08-07 DIAGNOSIS — C7B8 Other secondary neuroendocrine tumors: Secondary | ICD-10-CM | POA: Diagnosis not present

## 2019-08-07 DIAGNOSIS — Z90710 Acquired absence of both cervix and uterus: Secondary | ICD-10-CM | POA: Diagnosis not present

## 2019-08-07 DIAGNOSIS — I10 Essential (primary) hypertension: Secondary | ICD-10-CM | POA: Diagnosis not present

## 2019-08-07 DIAGNOSIS — Z7982 Long term (current) use of aspirin: Secondary | ICD-10-CM | POA: Diagnosis not present

## 2019-08-07 DIAGNOSIS — K581 Irritable bowel syndrome with constipation: Secondary | ICD-10-CM | POA: Diagnosis not present

## 2019-08-07 DIAGNOSIS — Z79899 Other long term (current) drug therapy: Secondary | ICD-10-CM | POA: Diagnosis not present

## 2019-08-07 DIAGNOSIS — C7A8 Other malignant neuroendocrine tumors: Secondary | ICD-10-CM | POA: Diagnosis not present

## 2019-08-07 DIAGNOSIS — Z85528 Personal history of other malignant neoplasm of kidney: Secondary | ICD-10-CM | POA: Diagnosis not present

## 2019-08-07 DIAGNOSIS — Z905 Acquired absence of kidney: Secondary | ICD-10-CM | POA: Diagnosis not present

## 2019-08-07 DIAGNOSIS — K6389 Other specified diseases of intestine: Secondary | ICD-10-CM | POA: Diagnosis not present

## 2019-08-07 DIAGNOSIS — Z87891 Personal history of nicotine dependence: Secondary | ICD-10-CM | POA: Diagnosis not present

## 2019-08-13 ENCOUNTER — Ambulatory Visit: Payer: Medicare HMO | Admitting: Nurse Practitioner

## 2019-08-18 DIAGNOSIS — K6389 Other specified diseases of intestine: Secondary | ICD-10-CM | POA: Diagnosis not present

## 2019-08-20 NOTE — Telephone Encounter (Signed)
error 

## 2019-08-26 DIAGNOSIS — C7A8 Other malignant neuroendocrine tumors: Secondary | ICD-10-CM | POA: Insufficient documentation

## 2019-08-27 DIAGNOSIS — C7B8 Other secondary neuroendocrine tumors: Secondary | ICD-10-CM | POA: Diagnosis not present

## 2019-08-27 DIAGNOSIS — Z48815 Encounter for surgical aftercare following surgery on the digestive system: Secondary | ICD-10-CM | POA: Diagnosis not present

## 2019-08-27 DIAGNOSIS — C7A8 Other malignant neuroendocrine tumors: Secondary | ICD-10-CM | POA: Diagnosis not present

## 2019-09-01 DIAGNOSIS — C779 Secondary and unspecified malignant neoplasm of lymph node, unspecified: Secondary | ICD-10-CM | POA: Diagnosis not present

## 2019-09-01 DIAGNOSIS — C7A8 Other malignant neuroendocrine tumors: Secondary | ICD-10-CM | POA: Diagnosis not present

## 2019-11-13 DIAGNOSIS — N952 Postmenopausal atrophic vaginitis: Secondary | ICD-10-CM | POA: Diagnosis not present

## 2019-11-13 DIAGNOSIS — R311 Benign essential microscopic hematuria: Secondary | ICD-10-CM | POA: Diagnosis not present

## 2019-11-13 DIAGNOSIS — Z85528 Personal history of other malignant neoplasm of kidney: Secondary | ICD-10-CM | POA: Diagnosis not present

## 2019-11-13 DIAGNOSIS — R8279 Other abnormal findings on microbiological examination of urine: Secondary | ICD-10-CM | POA: Diagnosis not present

## 2019-11-18 NOTE — Progress Notes (Signed)
Virtual Visit via Video Note  I connected with patient on 11/19/19 at 10:00 AM EST by audio enabled telemedicine application and verified that I am speaking with the correct person using two identifiers.   THIS ENCOUNTER IS A VIRTUAL VISIT DUE TO COVID-19 - PATIENT WAS NOT SEEN IN THE OFFICE. PATIENT HAS CONSENTED TO VIRTUAL VISIT / TELEMEDICINE VISIT   Location of patient: home  Location of provider: office  I discussed the limitations of evaluation and management by telemedicine and the availability of in person appointments. The patient expressed understanding and agreed to proceed.   Subjective:   Heidi [name] is a 73 y.o. female who presents for Medicare Annual (Subsequent) preventive examination.  Pt is retired from Pharmac[employer].  Review of Systems:  Home Safety/Smoke Alarms: Feels safe in home. Smoke alarms in place.  Lives alone in 1 story home. Walk in shower.    Female:    Mammo- 03/19/19      Dexa scan-  06/24/19      CCS- next due 08/2023    Objective:     Vitals: Unable to assess. This visit is enabled though telemedicine due to Covid 19.   Advanced Directives 11/19/2019 11/13/2018  Does Patient Have a Medical Advance Directive? No Yes  Type of Advance Directive - [place];Living will  Does patient want to make changes to medical advance directive? - No - Patient declined  Copy of [place] in Chart? - No - copy requested  Would patient like information on creating a medical advance directive? No - Patient declined -    Tobacco Social History   Tobacco Use  Smoking Status Former Smoker  . Packs/day: 0.25  . Years: 10.00  . Pack years: 2.50  . Types: Cigarettes  . Quit date: 10/24/1983  . Years since quitting: 36.0  Smokeless Tobacco Never Used     Counseling given: Not Answered   Clinical Intake: Pain : No/denies pain     Past Medical History:  Diagnosis Date  . Anxiety   . Chronic rhinitis   . DJD  (degenerative joint disease)   . GERD (gastroesophageal reflux disease)   . History of headache   . HTN (hypertension)   . Hypercholesterolemia   . IBS (irritable bowel syndrome)   . Renal cell carcinoma 2001  . Small bowel mass   . Venous insufficiency    Past Surgical History:  Procedure Laterality Date  . NEPHRECTOMY  2001   right = for renal cell CA  . small bowel mass removal    . TOTAL ABDOMINAL HYSTERECTOMY    . TUBAL LIGATION     Family History  Problem Relation Age of Onset  . Healthy Mother   . Diabetes Mother   . Hyperlipidemia Mother   . Hypertension Mother   . Prostate cancer Father   . Cancer Father   . Breast cancer Sister   . Cancer - Other Brother        neck ? lymph nodes  . Colon cancer Neg Hx   . Pancreatic cancer Neg Hx   . Stomach cancer Neg Hx    Social History   Socioeconomic History  . Marital status: Divorced    Spouse name: Not on file  . Number of children: 2  . Years of education: Not on file  . Highest education level: Not on file  Occupational History  . Not on file  Tobacco Use  . Smoking status: Former Smoker  Packs/day: 0.25    Years: 10.00    Pack years: 2.50    Types: Cigarettes    Quit date: 10/24/1983    Years since quitting: 36.0  . Smokeless tobacco: Never Used  Substance and Sexual Activity  . Alcohol use: Yes    Alcohol/week: 0.0 standard drinks    Comment: once a month  . Drug use: No  . Sexual activity: Yes    Partners: Male    Birth control/protection: Surgical    Comment: hysterectomy  Other Topics Concern  . Not on file  Social History Narrative   Exercises 3x per week   Caffeine use: 2 cups per day   3 siblings healthy   Social Determinants of Health   Financial Resource Strain: Low Risk   . Difficulty of Paying Living Expenses: Not hard at all  Food Insecurity: No Food Insecurity  . Worried About Charity fundraiser in the Last Year: Never true  . Ran Out of Food in the Last Year: Never true    Transportation Needs: No Transportation Needs  . Lack of Transportation (Medical): No  . Lack of Transportation (Non-Medical): No  Physical Activity:   . Days of Exercise per Week: Not on file  . Minutes of Exercise per Session: Not on file  Stress:   . Feeling of Stress : Not on file  Social Connections:   . Frequency of Communication with Friends and Family: Not on file  . Frequency of Social Gatherings with Friends and Family: Not on file  . Attends Religious Services: Not on file  . Active Member of Clubs or Organizations: Not on file  . Attends Archivist Meetings: Not on file  . Marital Status: Not on file    Outpatient Encounter Medications as of 11/19/2019  Medication Sig  . ALPHA LIPOIC ACID PO Take by mouth daily.    Marland Kitchen aspirin 81 MG tablet Take 81 mg by mouth daily.    . Biotin 10 MG TABS Take by mouth daily.    . cetirizine (ZYRTEC) 5 MG tablet Take 1 tablet (5 mg total) by mouth at bedtime.  . cholecalciferol (VITAMIN D) 1000 units tablet Take 2 tablets daily.  . Coenzyme Q10 (COQ10) 100 MG CAPS Take by mouth daily.    Marland Kitchen estradiol (ESTRACE) 0.1 MG/GM vaginal cream Place vaginally.  . Evening Primrose Oil 1000 MG CAPS Take by mouth daily.    . fluticasone (FLONASE) 50 MCG/ACT nasal spray Place 2 sprays into both nostrils daily.  Marland Kitchen losartan (COZAAR) 50 MG tablet Take 1 tablet (50 mg total) by mouth daily.  . pravastatin (PRAVACHOL) 40 MG tablet TAKE 1/2 TABLET (20MG       TOTAL) DAILY  . linaclotide (LINZESS) 145 MCG CAPS capsule Take 1 capsule (145 mcg total) by mouth daily before breakfast. (Patient not taking: Reported on 11/19/2019)  . [DISCONTINUED] benzonatate (TESSALON PERLES) 100 MG capsule Take 1 capsule (100 mg total) by mouth 3 (three) times daily as needed.   No facility-administered encounter medications on file as of 11/19/2019.    Activities of Daily Living In your present state of health, do you have any difficulty performing the following  activities: 11/19/2019  Hearing? N  Vision? N  Difficulty concentrating or making decisions? N  Walking or climbing stairs? N  Dressing or bathing? N  Doing errands, shopping? N  Preparing Food and eating ? N  Using the Toilet? N  In the past six months, have you accidently leaked urine?  N  Do you have problems with loss of bowel control? N  Managing your Medications? N  Managing your Finances? N  Housekeeping or managing your Housekeeping? N  Some recent data might be hidden    Patient Care Team: Nche, Charlene Brooke, NP as PCP - General (Internal Medicine)    Assessment:   This is a routine wellness examination for Heidi [name]. Physical assessment deferred to PCP.  Exercise Activities and Dietary recommendations Current Exercise Habits: Home exercise routine, Type of exercise: walking, Time (Minutes): 30, Frequency (Times/Week): 5, Weekly Exercise (Minutes/Week): 150, Exercise limited by: None identified Diet (meal preparation, eat out, water intake, caffeinated beverages, dairy products, fruits and vegetables): in general, a "healthy" diet  , well balanced    Goals    . Weight (lb) < 170 lb (77.1 kg)       Fall Risk Fall Risk  11/19/2019 11/13/2018 11/06/2018 05/06/2018 11/08/2017  Falls in the past year? 0 0 0 No No  Number falls in past yr: 0 - - - -  Injury with Fall? 0 - - - -  Follow up Education provided;Falls prevention discussed - - - -   Depression Screen PHQ 2/9 Scores 11/19/2019 11/13/2018 11/06/2018 05/06/2018  PHQ - 2 Score 0 0 0 0     Cognitive Function Ad8 score reviewed for issues:  Issues making decisions:no  Less interest in hobbies / activities:no  Repeats questions, stories (family complaining):no  Trouble using ordinary gadgets (microwave, computer, phone):no  Forgets the month or year: no  Mismanaging finances: no  Remembering appts:no  Daily problems with thinking and/or memory:no Ad8 score is=0     MMSE - Mini Mental State Exam 11/13/2018    Orientation to time 5  Orientation to Place 5  Registration 3  Attention/ Calculation 5  Recall 3  Language- name 2 objects 2  Language- repeat 1  Language- follow 3 step command 3  Language- read & follow direction 1  Write a sentence 1  Copy design 1  Total score 30        Immunization History  Administered Date(s) Administered  . Fluad Quad(high Dose 65+) 06/19/2019  . Influenza Split 10/12/2011, 10/11/2012  . Influenza Whole 10/14/2009, 10/12/2010  . Influenza,inj,Quad PF,6+ Mos 08/27/2013, 10/14/2014, 11/24/2015, 10/02/2016  . Influenza-Unspecified 08/23/2017, 07/23/2018  . Pneumococcal Conjugate-13 12/11/2016  . Pneumococcal Polysaccharide-23 04/11/2012  . Td 11/23/2010   Screening Tests Health Maintenance  Topic Date Due  . PNA vac Low Risk Adult (2 of 2 - PPSV23) 12/11/2017  . MAMMOGRAM  03/18/2020  . TETANUS/TDAP  11/23/2020  . COLONOSCOPY  09/11/2023  . INFLUENZA VACCINE  Completed  . DEXA SCAN  Completed  . Hepatitis C Screening  Completed       Plan:    Please schedule your next medicare wellness visit with me in 1 yr.  Continue to eat heart healthy diet (full of fruits, vegetables, whole grains, lean protein, water--limit salt, fat, and sugar intake) and increase physical activity as tolerated.  Continue doing brain stimulating activities (puzzles, reading, adult coloring books, staying active) to keep memory sharp.   Bring a copy of your living will and/or healthcare power of attorney to your next office visit.   I have personally reviewed and noted the following in the patient's chart:   . Medical and social history . Use of alcohol, tobacco or illicit drugs  . Current medications and supplements . Functional ability and status . Nutritional status . Physical activity . Advanced directives .  List of other physicians . Hospitalizations, surgeries, and ER visits in previous 12 months . Vitals . Screenings to include cognitive, depression,  and falls . Referrals and appointments  In addition, I have reviewed and discussed with patient certain preventive protocols, quality metrics, and best practice recommendations. A written personalized care plan for preventive services as well as general preventive health recommendations were provided to patient.     Shela Nevin, [place]  11/19/2019

## 2019-11-19 ENCOUNTER — Encounter: Payer: Self-pay | Admitting: *Deleted

## 2019-11-19 ENCOUNTER — Ambulatory Visit (INDEPENDENT_AMBULATORY_CARE_PROVIDER_SITE_OTHER): Payer: Medicare HMO | Admitting: *Deleted

## 2019-11-19 DIAGNOSIS — Z0000 Encounter for general adult medical examination without abnormal findings: Secondary | ICD-10-CM

## 2019-11-19 NOTE — Patient Instructions (Signed)
Please schedule your next medicare wellness visit with me in 1 yr.  Continue to eat heart healthy diet (full of fruits, vegetables, whole grains, lean protein, water--limit salt, fat, and sugar intake) and increase physical activity as tolerated.  Continue doing brain stimulating activities (puzzles, reading, adult coloring books, staying active) to keep memory sharp.   Bring a copy of your living will and/or healthcare power of attorney to your next office visit.   Ms. Nannini , Thank you for taking time to come for your Medicare Wellness Visit. I appreciate your ongoing commitment to your health goals. Please review the following plan we discussed and let me know if I can assist you in the future.   These are the goals we discussed: Goals    . Weight (lb) < 170 lb (77.1 kg)       This is a list of the screening recommended for you and due dates:  Health Maintenance  Topic Date Due  . Pneumonia vaccines (2 of 2 - PPSV23) 12/11/2017  . Mammogram  03/18/2020  . Tetanus Vaccine  11/23/2020  . Colon Cancer Screening  09/11/2023  . Flu Shot  Completed  . DEXA scan (bone density measurement)  Completed  .  Hepatitis C: One time screening is recommended by Center for Disease Control  (CDC) for  adults born from [birth year] through 1964.   Completed    Preventive Care 61 Years and Older, Female Preventive care refers to lifestyle choices and visits with your health care provider that can promote health and wellness. This includes:  A yearly physical exam. This is also called an annual well check.  Regular dental and eye exams.  Immunizations.  Screening for certain conditions.  Healthy lifestyle choices, such as diet and exercise. What can I expect for my preventive care visit? Physical exam Your health care provider will check:  Height and weight. These may be used to calculate body mass index (BMI), which is a measurement that tells if you are at a healthy weight.  Heart rate  and blood pressure.  Your skin for abnormal spots. Counseling Your health care provider may ask you questions about:  Alcohol, tobacco, and drug use.  Emotional well-being.  Home and relationship well-being.  Sexual activity.  Eating habits.  History of falls.  Memory and ability to understand (cognition).  Work and work Statistician.  Pregnancy and menstrual history. What immunizations do I need?  Influenza (flu) vaccine  This is recommended every year. Tetanus, diphtheria, and pertussis (Tdap) vaccine  You may need a Td booster every 10 years. Varicella (chickenpox) vaccine  You may need this vaccine if you have not already been vaccinated. Zoster (shingles) vaccine  You may need this after age 47. Pneumococcal conjugate (PCV13) vaccine  One dose is recommended after age 27. Pneumococcal polysaccharide (PPSV23) vaccine  One dose is recommended after age 57. Measles, mumps, and rubella (MMR) vaccine  You may need at least one dose of MMR if you were born in 1957 or later. You may also need a second dose. Meningococcal conjugate (MenACWY) vaccine  You may need this if you have certain conditions. Hepatitis A vaccine  You may need this if you have certain conditions or if you travel or work in places where you may be exposed to hepatitis A. Hepatitis B vaccine  You may need this if you have certain conditions or if you travel or work in places where you may be exposed to hepatitis B. Haemophilus influenzae type b (  Hib) vaccine  You may need this if you have certain conditions. You may receive vaccines as individual doses or as more than one vaccine together in one shot (combination vaccines). Talk with your health care provider about the risks and benefits of combination vaccines. What tests do I need? Blood tests  Lipid and cholesterol levels. These may be checked every 5 years, or more frequently depending on your overall health.  Hepatitis C  test.  Hepatitis B test. Screening  Lung cancer screening. You may have this screening every year starting at age 57 if you have a 30-pack-year history of smoking and currently smoke or have quit within the past 15 years.  Colorectal cancer screening. All adults should have this screening starting at age 87 and continuing until age 70. Your health care provider may recommend screening at age 49 if you are at increased risk. You will have tests every 1-10 years, depending on your results and the type of screening test.  Diabetes screening. This is done by checking your blood sugar (glucose) after you have not eaten for a while (fasting). You may have this done every 1-3 years.  Mammogram. This may be done every 1-2 years. Talk with your health care provider about how often you should have regular mammograms.  BRCA-related cancer screening. This may be done if you have a family history of breast, ovarian, tubal, or peritoneal cancers. Other tests  Sexually transmitted disease (STD) testing.  Bone density scan. This is done to screen for osteoporosis. You may have this done starting at age 24. Follow these instructions at home: Eating and drinking  Eat a diet that includes fresh fruits and vegetables, whole grains, lean protein, and low-fat dairy products. Limit your intake of foods with high amounts of sugar, saturated fats, and salt.  Take vitamin and mineral supplements as recommended by your health care provider.  Do not drink alcohol if your health care provider tells you not to drink.  If you drink alcohol: ? Limit how much you have to 0-1 drink a day. ? Be aware of how much alcohol is in your drink. In the [place] one drink equals one 12 oz bottle of beer (355 mL), one 5 oz glass of wine (148 mL), or one 1 oz glass of hard liquor (44 mL). Lifestyle  Take daily care of your teeth and gums.  Stay active. Exercise for at least 30 minutes on 5 or more days each week.  Do not use  any products that contain nicotine or tobacco, such as cigarettes, e-cigarettes, and chewing tobacco. If you need help quitting, ask your health care provider.  If you are sexually active, practice safe sex. Use a condom or other form of protection in order to prevent STIs (sexually transmitted infections).  Talk with your health care provider about taking a low-dose aspirin or statin. What's next?  Go to your health care provider once a year for a well check visit.  Ask your health care provider how often you should have your eyes and teeth checked.  Stay up to date on all vaccines. This information is not intended to replace advice given to you by your health care provider. Make sure you discuss any questions you have with your health care provider. Document Revised: 10/03/2018 Document Reviewed: 10/03/2018 Elsevier Patient Education  2020 [employer].

## 2019-12-01 DIAGNOSIS — C7B8 Other secondary neuroendocrine tumors: Secondary | ICD-10-CM | POA: Diagnosis not present

## 2019-12-01 DIAGNOSIS — C7A8 Other malignant neuroendocrine tumors: Secondary | ICD-10-CM | POA: Diagnosis not present

## 2019-12-09 DIAGNOSIS — C179 Malignant neoplasm of small intestine, unspecified: Secondary | ICD-10-CM | POA: Diagnosis not present

## 2019-12-09 LAB — HEPATIC FUNCTION PANEL
ALT: 10 (ref 7–35)
AST: 14 (ref 13–35)
Bilirubin, Total: 0.7

## 2019-12-09 LAB — BASIC METABOLIC PANEL
BUN: 12 (ref 4–21)
Creatinine: 0.9 (ref 0.5–1.1)
Glucose: 121
Potassium: 3.9 (ref 3.4–5.3)
Sodium: 138 (ref 137–147)

## 2019-12-09 LAB — COMPREHENSIVE METABOLIC PANEL
Albumin: 4.4 (ref 3.5–5.0)
Calcium: 10 (ref 8.7–10.7)

## 2019-12-09 LAB — CBC AND DIFFERENTIAL
HCT: 41 (ref 36–46)
Hemoglobin: 13.4 (ref 12.0–16.0)
Neutrophils Absolute: 5
Platelets: 303 (ref 150–399)
WBC: 7.2

## 2019-12-09 LAB — POCT INR: INR: 0.9 (ref 0.9–1.1)

## 2019-12-09 LAB — CBC: RBC: 4.55 (ref 3.87–5.11)

## 2019-12-25 DIAGNOSIS — N952 Postmenopausal atrophic vaginitis: Secondary | ICD-10-CM | POA: Diagnosis not present

## 2019-12-25 DIAGNOSIS — R8279 Other abnormal findings on microbiological examination of urine: Secondary | ICD-10-CM | POA: Diagnosis not present

## 2019-12-25 DIAGNOSIS — N3000 Acute cystitis without hematuria: Secondary | ICD-10-CM | POA: Diagnosis not present

## 2020-01-22 ENCOUNTER — Telehealth: Payer: Self-pay | Admitting: Nurse Practitioner

## 2020-01-22 NOTE — Progress Notes (Signed)
  Chronic Care Management   Outreach Note  01/22/2020 Name: Leeza Grala MRN: [id number] DOB: [birth year]  Referred by: Flossie Buffy, NP Reason for referral : No chief complaint on file.   An unsuccessful telephone outreach was attempted today. The patient was referred to the pharmacist for assistance with care management and care coordination.   Follow Up Plan:   Earney [place] [employer] Scheduler

## 2020-02-24 ENCOUNTER — Other Ambulatory Visit: Payer: Self-pay

## 2020-02-24 ENCOUNTER — Encounter: Payer: Self-pay | Admitting: Nurse Practitioner

## 2020-02-24 ENCOUNTER — Other Ambulatory Visit: Payer: Self-pay | Admitting: Nurse Practitioner

## 2020-02-24 ENCOUNTER — Telehealth (INDEPENDENT_AMBULATORY_CARE_PROVIDER_SITE_OTHER): Payer: Medicare HMO | Admitting: Nurse Practitioner

## 2020-02-24 VITALS — BP 165/94 | HR 62 | Temp 97.4°F | Ht 65.0 in | Wt 176.0 lb

## 2020-02-24 DIAGNOSIS — R739 Hyperglycemia, unspecified: Secondary | ICD-10-CM

## 2020-02-24 DIAGNOSIS — L247 Irritant contact dermatitis due to plants, except food: Secondary | ICD-10-CM

## 2020-02-24 DIAGNOSIS — Z1231 Encounter for screening mammogram for malignant neoplasm of breast: Secondary | ICD-10-CM

## 2020-02-24 DIAGNOSIS — I10 Essential (primary) hypertension: Secondary | ICD-10-CM

## 2020-02-24 MED ORDER — LOSARTAN POTASSIUM 50 MG PO TABS: 50.0000 mg | ORAL_TABLET | Freq: Every day | ORAL | 3 refills | Status: DC

## 2020-02-24 MED ORDER — METHYLPREDNISOLONE 4 MG PO TBPK: ORAL_TABLET | ORAL | 0 refills | Status: DC

## 2020-02-24 NOTE — Progress Notes (Signed)
Virtual Visit via Video Note  I connected with@ on 02/24/20 at 10:45 AM EDT by a video enabled telemedicine application and verified that I am speaking with the correct person using two identifiers.  Location: Patient:Home Provider: Office Participants: patient and provider  I discussed the limitations of evaluation and management by telemedicine and the availability of in person appointments. I also discussed with the patient that there may be a patient responsible charge related to this service. The patient expressed understanding and agreed to proceed.  [id number] since Saturday after working her yard, elevated BP and elevated glucose during hospitalization.  History of Present Illness: Poison Karlene Einstein This is a new problem. The current episode started in the past 7 days. The problem has been gradually worsening since onset. The affected locations include the face, neck, left arm, right arm, chest and torso. The rash is characterized by itchiness, redness and burning. She was exposed to plant contact. Associated symptoms include facial edema. Pertinent negatives include no anorexia, congestion, cough, eye pain, fever, joint pain, shortness of breath or sore throat. Past treatments include topical steroids. The treatment provided mild relief. There is no history of asthma or eczema.   HTN: Does not check BP at home No headache or dizziness or CP or LE edema Reviewed OV and [facility]: elevated BP readings: 160s/90s BP Readings from Last 3 Encounters:  02/24/20 (!) 165/94  07/18/19 (!) 142/82  05/13/19 140/86   Hyperglycemia: Noted during hospitalization post small bowel resection 41months ago: 120s-130s. No medication or insulin administered at that time. No glucometer to check glucose at home. No polyuria or polydipsia or polyphagia. No HgbA1c collected   Observations/Objective: Physical Exam  Constitutional: She is oriented to person, place, and time. No distress.  Eyes:  Conjunctivae and EOM are normal.  Pulmonary/Chest: Effort normal.  Musculoskeletal:     Cervical back: Normal range of motion.  Neurological: She is alert and oriented to person, place, and time.  Skin: Rash noted. Rash is maculopapular. There is erythema.     Psychiatric: She has a normal mood and affect. Her behavior is normal. Thought content normal.   Assessment and Plan: Jaylani was seen today for poison ivy.  Diagnoses and all orders for this visit:  Irritant contact dermatitis due to plants, except food -     methylPREDNISolone (MEDROL DOSEPAK) 4 MG TBPK tablet; Take as directed on package  Essential hypertension -     losartan (COZAAR) 50 MG tablet; Take 1-2 tablets (50-100 mg total) by mouth daily. 1tab if BP<149/80 and 2tabs if BP >150/90  Hyperglycemia -     Hemoglobin A1c; Future   Follow Up Instructions: Go to lab for blood draw Monitor BP daily in AM. Take losartan 1tab if BP<149/80 and 2tabs if BP >150/90. F/up in 68month.   I discussed the assessment and treatment plan with the patient. The patient was provided an opportunity to ask questions and all were answered. The patient agreed with the plan and demonstrated an understanding of the instructions.   The patient was advised to call back or seek an in-person evaluation if the symptoms worsen or if the condition fails to improve as anticipated.   Wilfred Lacy, NP

## 2020-02-24 NOTE — Patient Instructions (Signed)
Go to lab for blood draw  Monitor BP daily in AM. Take losartan 1tab if BP<149/80 and 2tabs if BP >150/90. F/up in 87month.  Contact Dermatitis Dermatitis is redness, soreness, and swelling (inflammation) of the skin. Contact dermatitis is a reaction to something that touches the skin. There are two types of contact dermatitis:  Irritant contact dermatitis. This happens when something bothers (irritates) your skin, like soap.  Allergic contact dermatitis. This is caused when you are exposed to something that you are allergic to, such as poison ivy. What are the causes?  Common causes of irritant contact dermatitis include: ? Makeup. ? Soaps. ? Detergents. ? Bleaches. ? Acids. ? Metals, such as nickel.  Common causes of allergic contact dermatitis include: ? Plants. ? Chemicals. ? Jewelry. ? Latex. ? Medicines. ? Preservatives in products, such as clothing. What increases the risk?  Having a job that exposes you to things that bother your skin.  Having asthma or eczema. What are the signs or symptoms? Symptoms may happen anywhere the irritant has touched your skin. Symptoms include:  Dry or flaky skin.  Redness.  Cracks.  Itching.  Pain or a burning feeling.  Blisters.  Blood or clear fluid draining from skin cracks. With allergic contact dermatitis, swelling may occur. This may happen in places such as the eyelids, mouth, or genitals. How is this treated?  This condition is treated by checking for the cause of the reaction and protecting your skin. Treatment may also include: ? Steroid creams, ointments, or medicines. ? Antibiotic medicines or other ointments, if you have a skin infection. ? Lotion or medicines to help with itching. ? A bandage (dressing). Follow these instructions at home: Skin care  Moisturize your skin as needed.  Put cool cloths on your skin.  Put a baking soda paste on your skin. Stir water into baking soda until it looks like a  paste.  Do not scratch your skin.  Avoid having things rub up against your skin.  Avoid the use of soaps, perfumes, and dyes. Medicines  Take or apply over-the-counter and prescription medicines only as told by your doctor.  If you were prescribed an antibiotic medicine, take or apply it as told by your doctor. Do not stop using it even if your condition starts to get better. Bathing  Take a bath with: ? Epsom salts. ? Baking soda. ? Colloidal oatmeal.  Bathe less often.  Bathe in warm water. Avoid using hot water. Bandage care  If you were given a bandage, change it as told by your health care provider.  Wash your hands with soap and water before and after you change your bandage. If soap and water are not available, use hand sanitizer. General instructions  Avoid the things that caused your reaction. If you do not know what caused it, keep a journal. Write down: ? What you eat. ? What skin products you use. ? What you drink. ? What you wear in the area that has symptoms. This includes jewelry.  Check the affected areas every day for signs of infection. Check for: ? More redness, swelling, or pain. ? More fluid or blood. ? Warmth. ? Pus or a bad smell.  Keep all follow-up visits as told by your doctor. This is important. Contact a doctor if:  You do not get better with treatment.  Your condition gets worse.  You have signs of infection, such as: ? More swelling. ? Tenderness. ? More redness. ? Soreness. ? Warmth.  You have a fever.  You have new symptoms. Get help right away if:  You have a very bad headache.  You have neck pain.  Your neck is stiff.  You throw up (vomit).  You feel very sleepy.  You see red streaks coming from the area.  Your bone or joint near the area hurts after the skin has healed.  The area turns darker.  You have trouble breathing. Summary  Dermatitis is redness, soreness, and swelling of the skin.  Symptoms may  occur where the irritant has touched you.  Treatment may include medicines and skin care.  If you do not know what caused your reaction, keep a journal.  Contact a doctor if your condition gets worse or you have signs of infection. This information is not intended to replace advice given to you by your health care provider. Make sure you discuss any questions you have with your health care provider. Document Revised: 01/29/2019 Document Reviewed: 04/24/2018 Elsevier Patient Education  [place].

## 2020-02-24 NOTE — Assessment & Plan Note (Signed)
BP Readings from Last 3 Encounters:  02/24/20 (!) 165/94  07/18/19 (!) 142/82  05/13/19 140/86  Bp not at goal with losartan 50mg . No headache or dizziness or CP or LE edema Reviewed OV and hospital notes from [facility]: elevated BP readings: 160s/90s  Monitor BP daily in AM. Take losartan 1tab if BP<149/80 and 2tabs if BP >150/90. F/up in 72month.

## 2020-02-25 ENCOUNTER — Other Ambulatory Visit: Payer: Self-pay

## 2020-02-25 DIAGNOSIS — Z8589 Personal history of malignant neoplasm of other organs and systems: Secondary | ICD-10-CM | POA: Diagnosis not present

## 2020-02-25 DIAGNOSIS — Z9889 Other specified postprocedural states: Secondary | ICD-10-CM | POA: Diagnosis not present

## 2020-02-25 DIAGNOSIS — C7A8 Other malignant neuroendocrine tumors: Secondary | ICD-10-CM | POA: Diagnosis not present

## 2020-02-25 DIAGNOSIS — Z8579 Personal history of other malignant neoplasms of lymphoid, hematopoietic and related tissues: Secondary | ICD-10-CM | POA: Diagnosis not present

## 2020-02-25 DIAGNOSIS — Z85068 Personal history of other malignant neoplasm of small intestine: Secondary | ICD-10-CM | POA: Diagnosis not present

## 2020-02-25 DIAGNOSIS — C7B8 Other secondary neuroendocrine tumors: Secondary | ICD-10-CM | POA: Diagnosis not present

## 2020-02-25 DIAGNOSIS — Z08 Encounter for follow-up examination after completed treatment for malignant neoplasm: Secondary | ICD-10-CM | POA: Diagnosis not present

## 2020-02-26 ENCOUNTER — Other Ambulatory Visit (INDEPENDENT_AMBULATORY_CARE_PROVIDER_SITE_OTHER): Payer: Medicare HMO

## 2020-02-26 DIAGNOSIS — R739 Hyperglycemia, unspecified: Secondary | ICD-10-CM | POA: Diagnosis not present

## 2020-02-26 LAB — HEMOGLOBIN A1C: Hgb A1c MFr Bld: 5.7 % (ref 4.6–6.5)

## 2020-02-27 ENCOUNTER — Encounter: Payer: Self-pay | Admitting: Nurse Practitioner

## 2020-03-05 ENCOUNTER — Encounter: Payer: Self-pay | Admitting: Nurse Practitioner

## 2020-03-10 DIAGNOSIS — C7A8 Other malignant neuroendocrine tumors: Secondary | ICD-10-CM | POA: Diagnosis not present

## 2020-03-10 DIAGNOSIS — C179 Malignant neoplasm of small intestine, unspecified: Secondary | ICD-10-CM | POA: Diagnosis not present

## 2020-03-10 DIAGNOSIS — C7B8 Other secondary neuroendocrine tumors: Secondary | ICD-10-CM | POA: Diagnosis not present

## 2020-03-19 ENCOUNTER — Ambulatory Visit
Admission: RE | Admit: 2020-03-19 | Discharge: 2020-03-19 | Disposition: A | Discharge status: Home / Self Care | Payer: Medicare HMO | Source: Ambulatory Visit | Attending: Nurse Practitioner | Admitting: Nurse Practitioner

## 2020-03-19 ENCOUNTER — Other Ambulatory Visit: Payer: Self-pay

## 2020-03-19 DIAGNOSIS — Z1231 Encounter for screening mammogram for malignant neoplasm of breast: Secondary | ICD-10-CM | POA: Diagnosis not present

## 2020-03-30 NOTE — Progress Notes (Signed)
74 y.o. [id number] Divorced Black or [place] American Not Hispanic or Latino female here for annual exam. H/O hysterectomy.    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 In 10/20 she had an exploratory laparotomy with SBR, BSO, appendectomy and cholecystectomy. Nodes were +. She is being followed by General surgery every 6 months. She is seeing an Materials engineer, has an appointment in a few weeks.  She is feeling well.   No other medical changes. The urologist gave her a small amount of estrogen cream at the opening of the urethra every other day, helping.  Sexually active, no pain. Her libido has improved. Same long term partner, they don't live together.   Patient's last menstrual period was 10/23/1994 (approximate).          Sexually active: Yes.    The current method of family planning is status post hysterectomy.    Exercising: Yes.    3 miles a day  Smoker:  no  Health Maintenance: Pap:  2001 WNL History of abnormal Pap:  no MMG:  03/23/20 negative BMD:   01/03/17 WNL Colonoscopy: 09/10/13, normal, repeat 10 years  TDaP:  11/24/19 Gardasil: na   reports that she quit smoking about 36 years ago. Her smoking use included cigarettes. She has a 2.50 pack-year smoking history. She has never used smokeless tobacco. She reports current alcohol use. She reports that she does not use drugs. ETOH on occasion. 2 grown kids, live in [place]. 2 grandchildren: 12 and 8 Retired Government social research officer  Past Medical History:  Diagnosis Date  . Anxiety   . Chronic rhinitis   . DJD (degenerative joint disease)   . GERD (gastroesophageal reflux disease)   . History of headache   . HTN (hypertension)   . Hypercholesterolemia   . IBS (irritable bowel syndrome)   . Renal cell carcinoma 2001  . Small bowel mass   . Venous insufficiency     Past Surgical History:  Procedure Laterality Date  . NEPHRECTOMY  2001   right = for renal cell CA  . small bowel mass removal    . TOTAL ABDOMINAL  HYSTERECTOMY    . TUBAL LIGATION      Current Outpatient Medications  Medication Sig Dispense Refill  . ALPHA LIPOIC ACID PO Take by mouth daily.      Marland Kitchen aspirin 81 MG tablet Take 81 mg by mouth daily.      . Biotin 10 MG TABS Take by mouth daily.      . cetirizine (ZYRTEC) 5 MG tablet Take 1 tablet (5 mg total) by mouth at bedtime.    . cholecalciferol (VITAMIN D) 1000 units tablet Take 2 tablets daily. 30 tablet 3  . Coenzyme Q10 (COQ10) 100 MG CAPS Take by mouth daily.      Marland Kitchen estradiol (ESTRACE) 0.1 MG/GM vaginal cream Place vaginally.    . Evening Primrose Oil 1000 MG CAPS Take by mouth daily.      . fluticasone (FLONASE) 50 MCG/ACT nasal spray Place 2 sprays into both nostrils daily. 16 g 3  . linaclotide (LINZESS) 145 MCG CAPS capsule Take 1 capsule (145 mcg total) by mouth daily before breakfast. 90 capsule 3  . losartan (COZAAR) 50 MG tablet Take 1-2 tablets (50-100 mg total) by mouth daily. 1tab if BP<149/80 and 2tabs if BP >150/90 180 tablet 3  . pravastatin (PRAVACHOL) 40 MG tablet TAKE 1/2 TABLET (20MG       TOTAL) DAILY 45 tablet 3   No current  facility-administered medications for this visit.    Family History  Problem Relation Age of Onset  . Healthy Mother   . Diabetes Mother   . Hyperlipidemia Mother   . Hypertension Mother   . Prostate cancer Father   . Cancer Father   . Breast cancer Sister   . Cancer - Other Brother        neck ? lymph nodes  . Colon cancer Neg Hx   . Pancreatic cancer Neg Hx   . Stomach cancer Neg Hx     Review of Systems  All other systems reviewed and are negative.   Exam:   BP (!) 160/82   Pulse 66   Temp 98.3 F (36.8 C)   Ht 5\' 5"  (1.651 m)   Wt 176 lb (79.8 kg)   LMP 10/23/1994 (Approximate)   SpO2 99%   BMI 29.29 kg/m   Weight change: @WEIGHTCHANGE @ Height:   Height: 5\' 5"  (165.1 cm)  Ht Readings from Last 3 Encounters:  04/01/20 5\' 5"  (1.651 m)  02/24/20 5\' 5"  (1.651 m)  07/18/19 5\' 5"  (1.651 m)    General  appearance: alert, cooperative and appears stated age Head: Normocephalic, without obvious abnormality, atraumatic Neck: no adenopathy, supple, symmetrical, trachea midline and thyroid normal to inspection and palpation Lungs: clear to auscultation bilaterally Cardiovascular: regular rate and rhythm Breasts: normal appearance, no masses or tenderness Abdomen: soft, non-tender; non distended,  no masses,  no organomegaly Extremities: extremities normal, atraumatic, no cyanosis or edema Skin: Skin color, texture, turgor normal. No rashes or lesions Lymph nodes: Cervical, supraclavicular, and axillary nodes normal. No abnormal inguinal nodes palpated Neurologic: Grossly normal   Pelvic: External genitalia:  no lesions              Urethra:  normal appearing urethra with no masses, tenderness or lesions              Bartholins and Skenes: normal                 Vagina: atrophic appearing vagina with normal color and discharge, no lesions              Cervix: absent               Bimanual Exam:  Uterus:  uterus absent              Adnexa: no mass, fullness, tenderness               Rectovaginal: Confirms               Anus:  normal sphincter tone, no lesions  Gae Dry chaperoned for the exam.  A:  Well Woman with normal exam  On vaginal estrogen, prescribed by Urology  H/O renal cell cancer  H/O small bowel cancer  H/O HTN, BP elevated today    P:   No pap needed  Mammogram UTD  Colonoscopy and DEXA UTD  Discussed breast self exam  Discussed calcium and vit D intake  Recheck BP 160/82, she will f/u with her primary

## 2020-03-31 ENCOUNTER — Other Ambulatory Visit: Payer: Self-pay

## 2020-04-01 ENCOUNTER — Ambulatory Visit (INDEPENDENT_AMBULATORY_CARE_PROVIDER_SITE_OTHER): Payer: Medicare HMO | Admitting: Obstetrics and Gynecology

## 2020-04-01 ENCOUNTER — Encounter: Payer: Self-pay | Admitting: Obstetrics and Gynecology

## 2020-04-01 VITALS — BP 160/82 | HR 66 | Temp 98.3°F | Ht 65.0 in | Wt 176.0 lb

## 2020-04-01 DIAGNOSIS — Z01419 Encounter for gynecological examination (general) (routine) without abnormal findings: Secondary | ICD-10-CM

## 2020-04-01 NOTE — Patient Instructions (Signed)
EXERCISE AND DIET:  We recommended that you start or continue a regular exercise program for good health. Regular exercise means any activity that makes your heart beat faster and makes you sweat.  We recommend exercising at least 30 minutes per day at least 3 days a week, preferably 4 or 5.  We also recommend a diet low in fat and sugar.  Inactivity, poor dietary choices and obesity can cause diabetes, heart attack, stroke, and kidney damage, among others.    ALCOHOL AND SMOKING:  Women should limit their alcohol intake to no more than 7 drinks/beers/glasses of wine (combined, not each!) per week. Moderation of alcohol intake to this level decreases your risk of breast cancer and liver damage. And of course, no recreational drugs are part of a healthy lifestyle.  And absolutely no smoking or even second hand smoke. Most people know smoking can cause heart and lung diseases, but did you know it also contributes to weakening of your bones? Aging of your skin?  Yellowing of your teeth and nails?  CALCIUM AND VITAMIN D:  Adequate intake of calcium and Vitamin D are recommended.  The recommendations for exact amounts of these supplements seem to change often, but generally speaking 1,200 mg of calcium (between diet and supplement) and 800 units of Vitamin D per day seems prudent. Certain women may benefit from higher intake of Vitamin D.  If you are among these women, your doctor will have told you during your visit.    PAP SMEARS:  Pap smears, to check for cervical cancer or precancers,  have traditionally been done yearly, although recent scientific advances have shown that most women can have pap smears less often.  However, every woman still should have a physical exam from her gynecologist every year. It will include a breast check, inspection of the vulva and vagina to check for abnormal growths or skin changes, a visual exam of the cervix, and then an exam to evaluate the size and shape of the uterus and  ovaries.  And after 73 years of age, a rectal exam is indicated to check for rectal cancers. We will also provide age appropriate advice regarding health maintenance, like when you should have certain vaccines, screening for sexually transmitted diseases, bone density testing, colonoscopy, mammograms, etc.   MAMMOGRAMS:  All women over 40 years old should have a yearly mammogram. Many facilities now offer a "3D" mammogram, which may cost around $50 extra out of pocket. If possible,  we recommend you accept the option to have the 3D mammogram performed.  It both reduces the number of women who will be called back for extra views which then turn out to be normal, and it is better than the routine mammogram at detecting truly abnormal areas.    COLON CANCER SCREENING: Now recommend starting at age 45. At this time colonoscopy is not covered for routine screening until 50. There are take home tests that can be done between 45-49.   COLONOSCOPY:  Colonoscopy to screen for colon cancer is recommended for all women at age 50.  We know, you hate the idea of the prep.  We agree, BUT, having colon cancer and not knowing it is worse!!  Colon cancer so often starts as a polyp that can be seen and removed at colonscopy, which can quite literally save your life!  And if your first colonoscopy is normal and you have no family history of colon cancer, most women don't have to have it again for   10 years.  Once every ten years, you can do something that may end up saving your life, right?  We will be happy to help you get it scheduled when you are ready.  Be sure to check your insurance coverage so you understand how much it will cost.  It may be covered as a preventative service at no cost, but you should check your particular policy.      Breast Self-Awareness Breast self-awareness means being familiar with how your breasts look and feel. It involves checking your breasts regularly and reporting any changes to your  health care provider. Practicing breast self-awareness is important. A change in your breasts can be a sign of a serious medical problem. Being familiar with how your breasts look and feel allows you to find any problems early, when treatment is more likely to be successful. All women should practice breast self-awareness, including women who have had breast implants. How to do a breast self-exam One way to learn what is normal for your breasts and whether your breasts are changing is to do a breast self-exam. To do a breast self-exam: Look for Changes  1. Remove all the clothing above your waist. 2. Stand in front of a mirror in a room with good lighting. 3. Put your hands on your hips. 4. Push your hands firmly downward. 5. Compare your breasts in the mirror. Look for differences between them (asymmetry), such as: ? Differences in shape. ? Differences in size. ? Puckers, dips, and bumps in one breast and not the other. 6. Look at each breast for changes in your skin, such as: ? Redness. ? Scaly areas. 7. Look for changes in your nipples, such as: ? Discharge. ? Bleeding. ? Dimpling. ? Redness. ? A change in position. Feel for Changes Carefully feel your breasts for lumps and changes. It is best to do this while lying on your back on the floor and again while sitting or standing in the shower or tub with soapy water on your skin. Feel each breast in the following way:  Place the arm on the side of the breast you are examining above your head.  Feel your breast with the other hand.  Start in the nipple area and make  inch (2 cm) overlapping circles to feel your breast. Use the pads of your three middle fingers to do this. Apply light pressure, then medium pressure, then firm pressure. The light pressure will allow you to feel the tissue closest to the skin. The medium pressure will allow you to feel the tissue that is a little deeper. The firm pressure will allow you to feel the tissue  close to the ribs.  Continue the overlapping circles, moving downward over the breast until you feel your ribs below your breast.  Move one finger-width toward the center of the body. Continue to use the  inch (2 cm) overlapping circles to feel your breast as you move slowly up toward your collarbone.  Continue the up and down exam using all three pressures until you reach your armpit.  Write Down What You Find  Write down what is normal for each breast and any changes that you find. Keep a written record with breast changes or normal findings for each breast. By writing this information down, you do not need to depend only on memory for size, tenderness, or location. Write down where you are in your menstrual cycle, if you are still menstruating. If you are having trouble noticing differences   in your breasts, do not get discouraged. With time you will become more familiar with the variations in your breasts and more comfortable with the exam. How often should I examine my breasts? Examine your breasts every month. If you are breastfeeding, the best time to examine your breasts is after a feeding or after using a breast pump. If you menstruate, the best time to examine your breasts is 5-7 days after your period is over. During your period, your breasts are lumpier, and it may be more difficult to notice changes. When should I see my health care provider? See your health care provider if you notice:  A change in shape or size of your breasts or nipples.  A change in the skin of your breast or nipples, such as a reddened or scaly area.  Unusual discharge from your nipples.  A lump or thick area that was not there before.  Pain in your breasts.  Anything that concerns you.  

## 2020-04-14 DIAGNOSIS — R970 Elevated carcinoembryonic antigen [CEA]: Secondary | ICD-10-CM | POA: Diagnosis not present

## 2020-04-14 DIAGNOSIS — C7A8 Other malignant neuroendocrine tumors: Secondary | ICD-10-CM | POA: Diagnosis not present

## 2020-04-14 DIAGNOSIS — C7B8 Other secondary neuroendocrine tumors: Secondary | ICD-10-CM | POA: Diagnosis not present

## 2020-05-10 DIAGNOSIS — C7A8 Other malignant neuroendocrine tumors: Secondary | ICD-10-CM | POA: Diagnosis not present

## 2020-05-10 DIAGNOSIS — C7B8 Other secondary neuroendocrine tumors: Secondary | ICD-10-CM | POA: Diagnosis not present

## 2020-05-10 DIAGNOSIS — K319 Disease of stomach and duodenum, unspecified: Secondary | ICD-10-CM | POA: Diagnosis not present

## 2020-06-16 DIAGNOSIS — C7B8 Other secondary neuroendocrine tumors: Secondary | ICD-10-CM | POA: Diagnosis not present

## 2020-06-16 DIAGNOSIS — C7A8 Other malignant neuroendocrine tumors: Secondary | ICD-10-CM | POA: Diagnosis not present

## 2020-06-16 DIAGNOSIS — R9389 Abnormal findings on diagnostic imaging of other specified body structures: Secondary | ICD-10-CM | POA: Diagnosis not present

## 2020-06-24 ENCOUNTER — Encounter: Payer: Self-pay | Admitting: Nurse Practitioner

## 2020-06-24 DIAGNOSIS — R69 Illness, unspecified: Secondary | ICD-10-CM | POA: Diagnosis not present

## 2020-06-24 DIAGNOSIS — R8279 Other abnormal findings on microbiological examination of urine: Secondary | ICD-10-CM | POA: Diagnosis not present

## 2020-06-24 DIAGNOSIS — N3000 Acute cystitis without hematuria: Secondary | ICD-10-CM | POA: Diagnosis not present

## 2020-06-25 ENCOUNTER — Telehealth: Payer: Self-pay | Admitting: Nurse Practitioner

## 2020-06-25 NOTE — Telephone Encounter (Signed)
Patient is calling and wanted to speak to someone regarding her pneumonia vaccine, please advise. CB is [phone]

## 2020-06-25 NOTE — Telephone Encounter (Signed)
Spoke with patient and answered questions regarding vaccine.

## 2020-07-05 DIAGNOSIS — C179 Malignant neoplasm of small intestine, unspecified: Secondary | ICD-10-CM | POA: Diagnosis not present

## 2020-07-05 DIAGNOSIS — C7A8 Other malignant neuroendocrine tumors: Secondary | ICD-10-CM | POA: Diagnosis not present

## 2020-07-05 DIAGNOSIS — R7989 Other specified abnormal findings of blood chemistry: Secondary | ICD-10-CM | POA: Diagnosis not present

## 2020-07-05 DIAGNOSIS — Z79899 Other long term (current) drug therapy: Secondary | ICD-10-CM | POA: Diagnosis not present

## 2020-07-05 DIAGNOSIS — R9389 Abnormal findings on diagnostic imaging of other specified body structures: Secondary | ICD-10-CM | POA: Diagnosis not present

## 2020-07-05 DIAGNOSIS — Z87891 Personal history of nicotine dependence: Secondary | ICD-10-CM | POA: Diagnosis not present

## 2020-07-05 DIAGNOSIS — C7B8 Other secondary neuroendocrine tumors: Secondary | ICD-10-CM | POA: Diagnosis not present

## 2020-07-06 ENCOUNTER — Telehealth: Payer: Self-pay | Admitting: Nurse Practitioner

## 2020-07-06 NOTE — Progress Notes (Signed)
  Chronic Care Management   Outreach Note  07/06/2020 Name: Heidi [name] MRN: [id number] DOB: 1946-07-06  Referred by: Flossie Buffy, NP Reason for referral : No chief complaint on file.   An unsuccessful telephone outreach was attempted today. The patient was referred to the pharmacist for assistance with care management and care coordination.   Follow Up Plan:   Carley Perdue [employer] Scheduler

## 2020-07-20 ENCOUNTER — Telehealth: Payer: Self-pay | Admitting: Nurse Practitioner

## 2020-07-20 NOTE — Progress Notes (Signed)
  Chronic Care Management   Note  07/20/2020 Name: [name] MRN: [id number] DOB: 08/27/1946  Heidi [name] is a 74 y.o. year old female who is a primary care patient of Nche, Charlene Brooke, NP. I reached out to [facility] by phone today in response to a referral sent by Heidi [name]'s PCP, Nche, Charlene Brooke, NP.   Heidi [name] was given information about Chronic Care Management services today including:  1. CCM service includes personalized support from designated clinical staff supervised by her physician, including individualized plan of care and coordination with other care providers 2. 24/7 contact phone numbers for assistance for urgent and routine care needs. 3. Service will only be billed when office clinical staff spend 20 minutes or more in a month to coordinate care. 4. Only one practitioner may furnish and bill the service in a calendar month. 5. The patient may stop CCM services at any time (effective at the end of the month) by phone call to the office staff.   Patient agreed to services and verbal consent obtained.   Follow up plan:   Carley Perdue [employer] Scheduler

## 2020-07-29 NOTE — Chronic Care Management (AMB) (Signed)
Chronic Care Management Pharmacy  Name: Renesha Lizama  MRN: [id number] DOB: 14-Apr-1946   Chief Complaint/ HPI  Heidi [name],  74 y.o. , female presents for their Initial CCM visit with the clinical pharmacist via telephone.  PCP : Flossie Buffy, NP Patient Care Team: Nche, Charlene Brooke, NP as PCP - General (Internal Medicine) Germaine Pomfret, [facility] as Pharmacist (Pharmacist)  Their chronic conditions include: Hypertension, Hyperlipidemia, GERD, Anxiety, Allergic Rhinitis and Irritable Bowel Syndrome and Carcinoma    Office Visits: 02/24/20: Patient presented to Wilfred Lacy, NP for poison ivy dermatitis. Patient started on methylprednisolone dose pack. BP in clinic 165/94, losartan increased to 100 mg PRN BP >150/90  Consult Visit: 07/05/20: Patient presented for initial visit to Dr. Dorene Grebe (Oncology). Patient without evidence of recurrence.  05/10/20: Televisit with Dr. Jerene Pitch ([facility]) for Carcinoma follow-up. Will only perform EGD if patient symptomatic. 04/14/20: Patient presented to Dr. Shary Decamp (GI) for carcinoma follow-up.  04/01/20: Patient presented to Dr. Talbert [facility]) for well woman exam.   No Known Allergies  Medications: Outpatient Encounter Medications as of 07/30/2020  Medication Sig   ALPHA LIPOIC ACID PO Take by mouth at bedtime.    Ascorbic Acid (SM CHEWABLE VITAMIN C) 500 MG CHEW Chew 500 mg by mouth daily.   aspirin 81 MG tablet Take 81 mg by mouth at bedtime.    Biotin 10 MG TABS Take by mouth daily.     cetirizine (ZYRTEC) 5 MG tablet Take 1 tablet (5 mg total) by mouth at bedtime. (Patient taking differently: Take 5 mg by mouth daily. )   cholecalciferol (VITAMIN D) 1000 units tablet Take 2 tablets daily. (Patient taking differently: 1,000 Units daily. )   Coenzyme Q10 (COQ10) 100 MG CAPS Take by mouth at bedtime.    estradiol (ESTRACE) 0.1 MG/GM vaginal cream Place vaginally every other day.    fluticasone (FLONASE) 50  MCG/ACT nasal spray Place 2 sprays into both nostrils daily. (Patient taking differently: Place 2 sprays into both nostrils daily as needed. )   linaclotide (LINZESS) 145 MCG CAPS capsule Take 1 capsule (145 mcg total) by mouth daily before breakfast.   losartan (COZAAR) 50 MG tablet Take 1-2 tablets (50-100 mg total) by mouth daily. 1tab if BP<149/80 and 2tabs if BP >150/90   NON FORMULARY CBD Gummy 10 mg nighty   pravastatin (PRAVACHOL) 40 MG tablet TAKE 1/2 TABLET (20MG TOTAL) DAILY. Need office visit for additional refills   [DISCONTINUED] pravastatin (PRAVACHOL) 40 MG tablet TAKE 1/2 TABLET (20MG      TOTAL) DAILY   Evening Primrose Oil 1000 MG CAPS Take by mouth daily.   (Patient not taking: Reported on 07/30/2020)   No facility-administered encounter medications on file as of 07/30/2020.    Wt Readings from Last 3 Encounters:  04/01/20 176 lb (79.8 kg)  02/24/20 176 lb (79.8 kg)  07/18/19 184 lb (83.5 kg)    Current Diagnosis/Assessment:  SDOH Interventions     Most Recent Value  SDOH Interventions  Financial Strain Interventions Other (Comment)  [Will start PAP]  Transportation Interventions Intervention Not Indicated      Goals Addressed            This Visit's Progress    Chronic Care Management       CARE PLAN ENTRY (see longitudinal plan of care for additional care plan information)  Current Barriers:   Chronic Disease Management support, education, and care coordination needs related to Hypertension, Hyperlipidemia, GERD, Anxiety, Allergic Rhinitis  and Irritable Bowel Syndrome and Carcinoma    Hypertension BP Readings from Last 3 Encounters:  04/01/20 (!) 160/82  02/24/20 (!) 165/94  07/18/19 (!) 142/82    Pharmacist Clinical Goal(s): o Over the next 90 days, patient will work with PharmD and providers to achieve BP goal <130/80  Current regimen:  o Losartan 50 mg 1-2 tablets daily  Interventions: o Discussed low salt diet and exercising as  tolerated extensively o Will initiate blood pressure monitoring plan   Patient self care activities - Over the next 90 days, patient will: o Check Blood Pressure 3-5 times weekly, document, and provide at future appointments o Ensure daily salt intake < 2300 mg/day  Hyperlipidemia Lab Results  Component Value Date/Time   LDLCALC 94 11/06/2018 10:08 AM   LDLDIRECT 149.6 07/08/2007 11:25 AM    Pharmacist Clinical Goal(s): o Over the next 90 days, patient will work with PharmD and providers to maintain LDL goal < 100  Current regimen:  o Pravastatin 40 mg 1/2 tablet daily   Interventions: o Discussed low cholesterol diet and exercising as tolerated extensively o Will initiate cholesterol monitoring plan   Medication management  Pharmacist Clinical Goal(s): o Over the next 90 days, patient will work with PharmD and providers to maintain optimal medication adherence  Current [facility]  Interventions o Comprehensive medication review performed. o Continue current medication management strategy  Patient self care activities - Over the next 90 days, patient will: o Take medications as prescribed o Report any questions or concerns to PharmD and/or provider(s)      Hypertension   BP goal is:  <130/80  Office blood pressures are  BP Readings from Last 3 Encounters:  04/01/20 (!) 160/82  02/24/20 (!) 165/94  07/18/19 (!) 142/82   Kidney Function Lab Results  Component Value Date/Time   CREATININE 0.9 12/09/2019 12:00 AM   CREATININE 0.98 05/13/2019 10:55 AM   CREATININE 1.06 11/06/2018 10:08 AM   CREATININE 1.07 (H) 11/27/2017 11:43 AM   GFR 67.49 05/13/2019 10:55 AM   GFRNONAA 53 (L) 11/27/2017 11:43 AM   GFRAA 61 11/27/2017 11:43 AM   K 3.9 12/09/2019 12:00 AM   K 4.3 05/13/2019 10:55 AM   Patient checks BP at home 1-2x per week Patient home BP readings are ranging: Systolics 212.   Patient has failed these meds in the past: n/a Patient is currently  controlled on the following medications:   Losartan 50 mg 1-2 tablets daily (AM)   We discussed diet and exercise extensively. Patient states she is active almost every day, and has been trying to eat more leafy greens and salads. Her goal is to completely control her BP with diet/exercise. Most days she only needs to take 1 tablet of losartan  Plan  Continue current medications   Hyperlipidemia   LDL goal < 100  Lipid Panel     Component Value Date/Time   CHOL 167 11/06/2018 1008   TRIG 68.0 11/06/2018 1008   HDL 59.50 11/06/2018 1008   LDLCALC 94 11/06/2018 1008   LDLDIRECT 149.6 07/08/2007 1125    Hepatic Function Latest Ref Rng & Units 12/09/2019 05/13/2019 11/06/2018  Total Protein 6.0 - 8.3 g/dL - 7.1 7.7  Albumin 3.5 - 5.0 4.4 4.3 4.6  AST 13 - 35 _0 ALT 7 - 35 10 13 47(H)  Alk Phosphatase 39 - 117 U/L - 63 61  Total Bilirubin 0.2 - 1.2 mg/dL - 0.7 0.6  Bilirubin, Direct 0.0 -  0.3 mg/dL - 0.1 -     The 10-year ASCVD risk score Mikey Bussing DC Jr., et al., 2013) is: 18%   Values used to calculate the score:     Age: 65 years     Sex: Female     Is Non-Hispanic African American: Yes     Diabetic: No     Tobacco smoker: No     Systolic Blood Pressure: 419 mmHg     Is BP treated: Yes     HDL Cholesterol: 59.5 mg/dL     Total Cholesterol: 167 mg/dL   Patient has failed these meds in past: n/a Patient is currently controlled on the following medications:   Pravastatin 40 mg 1/2 tablet daily (nightly)   We discussed:  diet and exercise extensively. Patient wondering if she still needs to be taking pravastatin. Discussed patient's cardiovascular risk and that stopping pravastatin might increase her LDL above goal. She was amenable to continuing at least until her lipid panel was rechecked.   Plan  Continue current medications  IBS   Managed by Dr. Fuller Plan   Patient has failed these meds in past: n/a Patient is currently controlled on the following medications:     Linzess 145 mcg daily   We discussed:  Patient states Linzess has been effective at controlling her symptoms, but has had affordability concerns with the medication. States that it costs ~$300 for 3 month supply.   Plan  Continue current medications  Will start PAP for Linzess  Allergic Rhinitis   Patient has failed these meds in past: n/a Patient is currently controlled on the following medications:   Cetirizine 5 mg QHS PRN  Flonase 50 mcg/act 2 spray daily PRN  We discussed:  Mainly has seasonal challenges during fall and spring.  Plan  Continue current medications   Misc / OTC    Alpha Lipoic Acid daily   Aspirin 81 mg daily   Biotin 10 mg daily (not taking)   Vitamin D 1000 units 2 tabs daily   CoQ10 100 mg daily   Estradiol vaginal cream  Evening Primrose Oil 1000 mg daily  CBD Gummy 10 mg nightly - Sleep   Vitamin C Chewables 500 mg 1 daily   Plan  Recommend stopping Primrose Oil due to risk of elevated BP.    Medication Management   [facility] Uses pill box? Yes  We discussed: Current pharmacy is preferred with insurance plan and patient is satisfied with pharmacy services  Plan  Continue current medication management strategy  Follow up: 12 month phone visit  Anna Pharmacist [facility]  [phone]

## 2020-07-30 ENCOUNTER — Ambulatory Visit: Payer: Medicare HMO

## 2020-07-30 ENCOUNTER — Telehealth: Payer: Self-pay

## 2020-07-30 ENCOUNTER — Other Ambulatory Visit: Payer: Self-pay | Admitting: Nurse Practitioner

## 2020-07-30 DIAGNOSIS — E7800 Pure hypercholesterolemia, unspecified: Secondary | ICD-10-CM

## 2020-07-30 DIAGNOSIS — I10 Essential (primary) hypertension: Secondary | ICD-10-CM

## 2020-07-30 NOTE — Progress Notes (Signed)
    Chronic Care Management Pharmacy Assistant   Name: Heidi [name]  MRN: [id number] DOB: 05-28-1946  Reason for Encounter: Medication Review/Patient assistance form for linzess    PCP : Flossie Buffy, NP  Allergies:  No Known Allergies  Medications: Outpatient Encounter Medications as of 07/30/2020  Medication Sig  . ALPHA LIPOIC ACID PO Take by mouth at bedtime.   Marland Kitchen aspirin 81 MG tablet Take 81 mg by mouth at bedtime.   . Biotin 10 MG TABS Take by mouth daily.    . cetirizine (ZYRTEC) 5 MG tablet Take 1 tablet (5 mg total) by mouth at bedtime. (Patient taking differently: Take 5 mg by mouth daily. )  . cholecalciferol (VITAMIN D) 1000 units tablet Take 2 tablets daily. (Patient taking differently: 1,000 Units daily. )  . Coenzyme Q10 (COQ10) 100 MG CAPS Take by mouth at bedtime.   Marland Kitchen estradiol (ESTRACE) 0.1 MG/GM vaginal cream Place vaginally every other day.   . Evening Primrose Oil 1000 MG CAPS Take by mouth daily.   (Patient not taking: Reported on 07/30/2020)  . fluticasone (FLONASE) 50 MCG/ACT nasal spray Place 2 sprays into both nostrils daily. (Patient taking differently: Place 2 sprays into both nostrils daily as needed. )  . linaclotide (LINZESS) 145 MCG CAPS capsule Take 1 capsule (145 mcg total) by mouth daily before breakfast.  . losartan (COZAAR) 50 MG tablet Take 1-2 tablets (50-100 mg total) by mouth daily. 1tab if BP<149/80 and 2tabs if BP >150/90  . pravastatin (PRAVACHOL) 40 MG tablet TAKE 1/2 TABLET (20MG       TOTAL) DAILY   No facility-administered encounter medications on file as of 07/30/2020.    Current Diagnosis: Patient Active Problem List   Diagnosis Date Noted  . 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 Small bowel lesion 05/13/2019  . Constipation 06/08/2016  . Heel spur 04/19/2015  . CHRONIC RHINITIS 01/12/2009  . Malignant neoplasm of kidney excluding renal  pelvis ([place]) 01/11/2008  . Anxiety state 01/11/2008  . ACUTE CYSTITIS 01/11/2008  . Osteoarthritis 01/11/2008  . HEADACHE 01/11/2008  . HYPERCHOLESTEROLEMIA 01/02/2008  . Essential hypertension 01/02/2008  . Venous (peripheral) insufficiency 01/02/2008  . GERD 01/02/2008  . IRRITABLE BOWEL SYNDROME 01/02/2008     Follow-Up:  Pharmacist Review   Spoke to patient to inform her that we are sending her a Patient assistance form  for Linzess  by mail.Informed patient to include a copy of her proof of income AND a copy of her Explanation of Benefits (EOB) statement from her insurance.Advised patient to return the PAP forms back to the [place] office.  [place] Pharmacist Assistant [phone]

## 2020-08-17 ENCOUNTER — Encounter: Payer: Self-pay | Admitting: Nurse Practitioner

## 2020-08-17 DIAGNOSIS — R69 Illness, unspecified: Secondary | ICD-10-CM | POA: Diagnosis not present

## 2020-08-27 DIAGNOSIS — C7A8 Other malignant neuroendocrine tumors: Secondary | ICD-10-CM | POA: Diagnosis not present

## 2020-08-27 DIAGNOSIS — C7B8 Other secondary neuroendocrine tumors: Secondary | ICD-10-CM | POA: Diagnosis not present

## 2020-08-27 DIAGNOSIS — Z08 Encounter for follow-up examination after completed treatment for malignant neoplasm: Secondary | ICD-10-CM | POA: Diagnosis not present

## 2020-08-27 DIAGNOSIS — Z8589 Personal history of malignant neoplasm of other organs and systems: Secondary | ICD-10-CM | POA: Diagnosis not present

## 2020-09-01 ENCOUNTER — Telehealth: Payer: Self-pay

## 2020-09-01 DIAGNOSIS — I10 Essential (primary) hypertension: Secondary | ICD-10-CM

## 2020-09-01 NOTE — Progress Notes (Addendum)
Chronic Care Management Pharmacy Assistant   Name: Heidi [name]  MRN: [id number] DOB: 1946-06-09  Reason for Encounter:Hypertension Disease State Call.   PCP : Flossie Buffy, NP  Allergies:  No Known Allergies  Medications: Outpatient Encounter Medications as of 09/01/2020  Medication Sig  . ALPHA LIPOIC ACID PO Take by mouth at bedtime.   . Ascorbic Acid (SM CHEWABLE VITAMIN C) 500 MG CHEW Chew 500 mg by mouth daily.  Marland Kitchen aspirin 81 MG tablet Take 81 mg by mouth at bedtime.   . Biotin 10 MG TABS Take by mouth daily.    . cetirizine (ZYRTEC) 5 MG tablet Take 1 tablet (5 mg total) by mouth at bedtime. (Patient taking differently: Take 5 mg by mouth daily. )  . cholecalciferol (VITAMIN D) 1000 units tablet Take 2 tablets daily. (Patient taking differently: 1,000 Units daily. )  . Coenzyme Q10 (COQ10) 100 MG CAPS Take by mouth at bedtime.   Marland Kitchen estradiol (ESTRACE) 0.1 MG/GM vaginal cream Place vaginally every other day.   . Evening Primrose Oil 1000 MG CAPS Take by mouth daily.   (Patient not taking: Reported on 07/30/2020)  . fluticasone (FLONASE) 50 MCG/ACT nasal spray Place 2 sprays into both nostrils daily. (Patient taking differently: Place 2 sprays into both nostrils daily as needed. )  . linaclotide (LINZESS) 145 MCG CAPS capsule Take 1 capsule (145 mcg total) by mouth daily before breakfast.  . losartan (COZAAR) 50 MG tablet Take 1-2 tablets (50-100 mg total) by mouth daily. 1tab if BP<149/80 and 2tabs if BP >150/90  . NON FORMULARY CBD Gummy 10 mg nighty  . pravastatin (PRAVACHOL) 40 MG tablet TAKE 1/2 TABLET (20MG  TOTAL) DAILY. Need office visit for additional refills   No facility-administered encounter medications on file as of 09/01/2020.    Current Diagnosis: Patient Active Problem List   Diagnosis Date Noted  . 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 Small bowel lesion  05/13/2019  . Constipation 06/08/2016  . Heel spur 04/19/2015  . CHRONIC RHINITIS 01/12/2009  . Malignant neoplasm of kidney excluding renal pelvis ([place]) 01/11/2008  . Anxiety state 01/11/2008  . ACUTE CYSTITIS 01/11/2008  . Osteoarthritis 01/11/2008  . HEADACHE 01/11/2008  . HYPERCHOLESTEROLEMIA 01/02/2008  . Essential hypertension 01/02/2008  . Venous (peripheral) insufficiency 01/02/2008  . GERD 01/02/2008  . IRRITABLE BOWEL SYNDROME 01/02/2008    Follow-Up:  Pharmacist Review   Reviewed chart prior to disease state call. Spoke with patient regarding BP  Recent Office Vitals: BP Readings from Last 3 Encounters:  04/01/20 (!) 160/82  02/24/20 (!) 165/94  07/18/19 (!) 142/82   Pulse Readings from Last 3 Encounters:  04/01/20 66  02/24/20 62  07/18/19 64    Wt Readings from Last 3 Encounters:  04/01/20 176 lb (79.8 kg)  02/24/20 176 lb (79.8 kg)  07/18/19 184 lb (83.5 kg)     Kidney Function Lab Results  Component Value Date/Time   CREATININE 0.9 12/09/2019 12:00 AM   CREATININE 0.98 05/13/2019 10:55 AM   CREATININE 1.06 11/06/2018 10:08 AM   CREATININE 1.07 (H) 11/27/2017 11:43 AM   GFR 67.49 05/13/2019 10:55 AM   GFRNONAA 53 (L) 11/27/2017 11:43 AM   GFRAA 61 11/27/2017 11:43 AM    BMP Latest Ref Rng & Units 12/09/2019 05/13/2019 11/06/2018  Glucose 70 - 99 mg/dL - 96 91  BUN 4 - 21 12 11 13   Creatinine 0.5 - 1.1 0.9 0.98  1.06  BUN/Creat Ratio 6 - 22 (calc) - - -  Sodium 137 - 147 138 139 137  Potassium 3.4 - 5.3 3.9 4.3 3.3(L)  Chloride 96 - 112 mEq/L - 105 101  CO2 19 - 32 mEq/L - 26 27  Calcium 8.7 - 10.7 10.0 9.5 9.7    . Current antihypertensive regimen:  ? Losartan 50 mg 1-2 tablets daily . How often are you checking your Blood Pressure? infrequently . Current home BP readings:  o Patient states her blood pressure has been ranging around 150's/80's. Patient states she has been out of town traveling , but she will start to check her blood pressure  daily when she returns. . What recent interventions/DTPs have been made by any provider to improve Blood Pressure control since last CPP Visit: none ID  . Any recent hospitalizations or ED visits since last visit with CPP? No . What diet changes have been made to improve Blood Pressure Control?  o Patient reports she cooks mostly at home without using salt.Patient states she is going to start to drink more water. . What exercise is being done to improve your Blood Pressure Control?  o Patient states she has not be able to exercise due to her knee pain.  Adherence Review: Is the patient currently on ACE/ARB medication? Yes Does the patient have >5 day gap between last estimated fill dates? Yes   Joycelyn Schmid Clinical Pharmacist Assistant [phone]    Addendum 09/08/20: BP potentially elevated per patient report and recent office visits (172/86 on 08/27/20). Will ensure patient is taking 2 tablets of losartan daily. Plan for sooner CPP follow-up in ~3 weeks. Increase monitoring of BP to every other day until next CPP visit.   Doristine Section Clinical Pharmacist [place] [facility]  [phone]

## 2020-09-09 ENCOUNTER — Telehealth: Payer: Self-pay

## 2020-09-09 NOTE — Telephone Encounter (Signed)
-----   Message from Germaine Pomfret, [facility] sent at 09/09/2020  9:40 AM EST ----- Marnette Burgess, Please reach out to Ms Waverley and ask for her to do the following: Ensure patient is taking 2 tablets of losartan dailySchedule her for sooner CPP follow-up in ~3 weeks (12/7 9a-2p, 12/8 9a-3p, 12/9 9a-12p or 2p-4p)Increase monitoring of BP to every other day until next CPP visit.

## 2020-09-09 NOTE — Progress Notes (Signed)
Spoke to patient per clinical pharmacist to confirm with her to do the following:  Ensure patient is taking 2 tablets of losartan daily  Schedule her for sooner CPP follow-up in ~3 weeks (12/7 9a-2p, 12/8 9a-3p, 12/9 9a-12p or 2p-4p)  Increase monitoring of BP to every other day until next CPP visit.   Patient states she takes 2 tablets of losartan every morning. Telephone appointment schedule on 09/28/20 at 9:00 am with clinical pharmacist. Patient verbalized understanding and agreed to check her blood pressure every other day to report back to CPP.  [place] Pharmacist Assistant [phone]

## 2020-09-21 ENCOUNTER — Telehealth: Payer: Self-pay

## 2020-09-21 NOTE — Progress Notes (Signed)
    Chronic Care Management Pharmacy Assistant   Name: Heidi [name]  MRN: [id number] DOB: 1946/06/17  Reason for Encounter: Medication Review  PCP : Flossie Buffy, NP  Allergies:  No Known Allergies  Medications: Outpatient Encounter Medications as of 09/21/2020  Medication Sig  . ALPHA LIPOIC ACID PO Take by mouth at bedtime.   . Ascorbic Acid (SM CHEWABLE VITAMIN C) 500 MG CHEW Chew 500 mg by mouth daily.  Marland Kitchen aspirin 81 MG tablet Take 81 mg by mouth at bedtime.   . Biotin 10 MG TABS Take by mouth daily.    . cetirizine (ZYRTEC) 5 MG tablet Take 1 tablet (5 mg total) by mouth at bedtime. (Patient taking differently: Take 5 mg by mouth daily. )  . cholecalciferol (VITAMIN D) 1000 units tablet Take 2 tablets daily. (Patient taking differently: 1,000 Units daily. )  . Coenzyme Q10 (COQ10) 100 MG CAPS Take by mouth at bedtime.   Marland Kitchen estradiol (ESTRACE) 0.1 MG/GM vaginal cream Place vaginally every other day.   . Evening Primrose Oil 1000 MG CAPS Take by mouth daily.   (Patient not taking: Reported on 07/30/2020)  . fluticasone (FLONASE) 50 MCG/ACT nasal spray Place 2 sprays into both nostrils daily. (Patient taking differently: Place 2 sprays into both nostrils daily as needed. )  . linaclotide (LINZESS) 145 MCG CAPS capsule Take 1 capsule (145 mcg total) by mouth daily before breakfast.  . losartan (COZAAR) 50 MG tablet Take 1-2 tablets (50-100 mg total) by mouth daily. 1tab if BP<149/80 and 2tabs if BP >150/90  . NON FORMULARY CBD Gummy 10 mg nighty  . pravastatin (PRAVACHOL) 40 MG tablet TAKE 1/2 TABLET (20MG  TOTAL) DAILY. Need office visit for additional refills   No facility-administered encounter medications on file as of 09/21/2020.    Current Diagnosis: Patient Active Problem List   Diagnosis Date Noted  . 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 Small bowel lesion 05/13/2019  .  Constipation 06/08/2016  . Heel spur 04/19/2015  . CHRONIC RHINITIS 01/12/2009  . Malignant neoplasm of kidney excluding renal pelvis ([place]) 01/11/2008  . Anxiety state 01/11/2008  . ACUTE CYSTITIS 01/11/2008  . Osteoarthritis 01/11/2008  . HEADACHE 01/11/2008  . HYPERCHOLESTEROLEMIA 01/02/2008  . Essential hypertension 01/02/2008  . Venous (peripheral) insufficiency 01/02/2008  . GERD 01/02/2008  . IRRITABLE BOWEL SYNDROME 01/02/2008    Follow-Up:  Medication Cost Review and Pharmacist Review   Performed cost analysis for patient, estimated yearly medication cost of $450.00.    [place] Pharmacist Assistant [phone]

## 2020-09-23 ENCOUNTER — Other Ambulatory Visit: Payer: Self-pay

## 2020-09-23 DIAGNOSIS — I10 Essential (primary) hypertension: Secondary | ICD-10-CM

## 2020-09-23 MED ORDER — LOSARTAN POTASSIUM 50 MG PO TABS: 50.0000 mg | ORAL_TABLET | Freq: Every day | ORAL | 1 refills | Status: DC

## 2020-09-23 NOTE — Telephone Encounter (Signed)
Last VV 02/24/20 Last fill 02/24/20  #180/3

## 2020-09-27 ENCOUNTER — Telehealth: Payer: Self-pay

## 2020-09-27 NOTE — Progress Notes (Signed)
Spoke to patient to confirmed patient telephone appointment on 09/28/2020 for CCM at 9:00 am with Junius Argyle the Clinical pharmacist.   [place] Pharmacist Assistant [phone]

## 2020-09-28 ENCOUNTER — Ambulatory Visit: Payer: Medicare HMO

## 2020-09-28 DIAGNOSIS — I10 Essential (primary) hypertension: Secondary | ICD-10-CM

## 2020-09-28 DIAGNOSIS — E7800 Pure hypercholesterolemia, unspecified: Secondary | ICD-10-CM

## 2020-09-28 NOTE — Chronic Care Management (AMB) (Signed)
Chronic Care Management Pharmacy  Name: Heidi [name]  MRN: [id number] DOB: 06-Oct-1946   Chief Complaint/ HPI  Heidi [name],  74 y.o. , female presents for their Follow-Up CCM visit with the clinical pharmacist via telephone.  PCP : Heidi Buffy, NP Patient Care Team: Nche, Charlene Brooke, NP as PCP - General (Internal Medicine) Heidi [name], [facility] as Pharmacist (Pharmacist)  Their chronic conditions include: Hypertension, Hyperlipidemia, GERD, Anxiety, Allergic Rhinitis and Irritable Bowel Syndrome and Carcinoma    Office Visits: 02/24/20: Patient presented to Heidi Lacy, NP for poison ivy dermatitis. Patient started on methylprednisolone dose pack. BP in clinic 165/94, losartan increased to 100 mg PRN BP >150/90  Consult Visit: 08/27/20: Patient presented to Dr. Winona [name] for carcinoma follow-up.  07/05/20: Patient presented for initial visit to Dr. Dorene [name] (Oncology). Patient without evidence of recurrence.  05/10/20: Televisit with Dr. Jerene [name] ([facility]) for Carcinoma follow-up. Will only perform EGD if patient symptomatic. 04/14/20: Patient presented to Dr. Shary [name] (GI) for carcinoma follow-up.  04/01/20: Patient presented to Dr. Talbert [name] [facility]) for well woman exam.   No Known Allergies  Medications: Outpatient Encounter Medications as of 09/28/2020  Medication Sig  . ALPHA LIPOIC ACID PO Take by mouth at bedtime.   . Ascorbic Acid (SM CHEWABLE VITAMIN C) 500 MG CHEW Chew 500 mg by mouth daily.  Marland Kitchen aspirin 81 MG tablet Take 81 mg by mouth at bedtime.   . Biotin 10 MG TABS Take by mouth daily.    . cetirizine (ZYRTEC) 5 MG tablet Take 1 tablet (5 mg total) by mouth at bedtime. (Patient taking differently: Take 5 mg by mouth daily. )  . cholecalciferol (VITAMIN D) 1000 units tablet Take 2 tablets daily. (Patient taking differently: 1,000 Units daily. )  . Coenzyme Q10 (COQ10) 100 MG CAPS Take by mouth at bedtime.   Marland Kitchen estradiol (ESTRACE) 0.1 MG/GM vaginal  cream Place vaginally every other day.   . fluticasone (FLONASE) 50 MCG/ACT nasal spray Place 2 sprays into both nostrils daily. (Patient taking differently: Place 2 sprays into both nostrils daily as needed. )  . losartan (COZAAR) 50 MG tablet Take 1-2 tablets (50-100 mg total) by mouth daily. 1tab if BP<149/80 and 2tabs if BP >150/90. Need office visit for additional refills  . NON FORMULARY CBD Gummy 10 mg nighty  . pravastatin (PRAVACHOL) 40 MG tablet TAKE 1/2 TABLET (20MG TOTAL) DAILY. Need office visit for additional refills  . [DISCONTINUED] Evening Primrose Oil 1000 MG CAPS Take by mouth daily.   (Patient not taking: Reported on 07/30/2020)  . [DISCONTINUED] linaclotide (LINZESS) 145 MCG CAPS capsule Take 1 capsule (145 mcg total) by mouth daily before breakfast.   No facility-administered encounter medications on file as of 09/28/2020.    Wt Readings from Last 3 Encounters:  04/01/20 176 lb (79.8 kg)  02/24/20 176 lb (79.8 kg)  07/18/19 184 lb (83.5 kg)    Current Diagnosis/Assessment:  SDOH Interventions     Most Recent Value  SDOH Interventions  Financial Strain Interventions Intervention Not Indicated  Transportation Interventions Intervention Not Indicated      Goals Addressed            This Visit's Progress   . Chronic Care Management   On track    CARE PLAN ENTRY (see longitudinal plan of care for additional care plan information)  Current Barriers:  . Chronic Disease Management support, education, and care coordination needs related to Hypertension, Hyperlipidemia, GERD, Anxiety, Allergic Rhinitis and Irritable Bowel Syndrome  and Carcinoma    Hypertension BP Readings from Last 3 Encounters:  04/01/20 (!) 160/82  02/24/20 (!) 165/94  07/18/19 (!) 142/82   . Pharmacist Clinical Goal(s): o Over the next 90 days, patient will work with PharmD and providers to achieve BP goal <130/80 . Current regimen:  o Losartan 50 mg 1-2 tablets daily . Interventions: o  Discussed low salt diet and exercising as tolerated extensively o Will initiate blood pressure monitoring plan  . Patient self care activities - Over the next 90 days, patient will: o Check Blood Pressure 3-5 times weekly, document, and provide at future appointments o Ensure daily salt intake < 2300 mg/day  Hyperlipidemia Lab Results  Component Value Date/Time   LDLCALC 94 11/06/2018 10:08 AM   LDLDIRECT 149.6 07/08/2007 11:25 AM   . Pharmacist Clinical Goal(s): o Over the next 90 days, patient will work with PharmD and providers to maintain LDL goal < 100 . Current regimen:  o Pravastatin 40 mg 1/2 tablet daily  . Interventions: o Discussed low cholesterol diet and exercising as tolerated extensively o Will initiate cholesterol monitoring plan   Medication management . Pharmacist Clinical Goal(s): o Over the next 90 days, patient will work with PharmD and providers to maintain optimal medication adherence . [facility] . Interventions o Comprehensive medication review performed. o Continue current medication management strategy . Patient self care activities - Over the next 90 days, patient will: o Take medications as prescribed o Report any questions or concerns to PharmD and/or provider(s)      Hypertension   BP goal is:  <130/80  Office blood pressures are  BP Readings from Last 3 Encounters:  04/01/20 (!) 160/82  02/24/20 (!) 165/94  07/18/19 (!) 142/82   Kidney Function Lab Results  Component Value Date/Time   CREATININE 0.9 12/09/2019 12:00 AM   CREATININE 0.98 05/13/2019 10:55 AM   CREATININE 1.06 11/06/2018 10:08 AM   CREATININE 1.07 (H) 11/27/2017 11:43 AM   GFR 67.49 05/13/2019 10:55 AM   GFRNONAA 53 (L) 11/27/2017 11:43 AM   GFRAA 61 11/27/2017 11:43 AM   K 3.9 12/09/2019 12:00 AM   K 4.3 05/13/2019 10:55 AM   Patient checks BP at home daily Patient home BP readings are ranging:  7-Dec 175/85  6-Dec 118/100  5-Dec 163/84  2-Dec  136/83  1-Dec 140/89  30-Nov 169/91  29-Nov 170/111  26-Nov 159/83  23-Nov 178/100  22-Nov 157/89  21-Nov 125/76  20-Nov 130/81  19-Nov 135/82  Average 150/89   Patient has failed these meds in the past: HCTZ (low BP)   Patient is currently controlled on the following medications:  . Losartan 50 mg 2 tablets daily (AM)   We discussed diet and exercise extensively. Patient's blood pressures have been significantly elevated and she is symptomatic with headaches.   Patient has consistently been taking losartan 100 mg daily. She states that she has been eating more salty food recently, although she does not salt her food. With the holidays, patient reports she has not been exercising. She does have a stationary bike which she wants to get back into using.   Counseled patient on strict sodium restriction and increasing activity level   Plan  Recommend stopping losartan Recommend starting losartan-HCTZ 100-12.5 mg daily  Patient to work on minimizing sodium intake and increasing activity level slowly as tolerated.  CPA assessment in 2 weeks to ensure tolerability and assess blood pressures.   Hyperlipidemia   LDL goal < 100  Lipid Panel     Component Value Date/Time   CHOL 167 11/06/2018 1008   TRIG 68.0 11/06/2018 1008   HDL 59.50 11/06/2018 1008   LDLCALC 94 11/06/2018 1008   LDLDIRECT 149.6 07/08/2007 1125    Hepatic Function Latest Ref Rng & Units 12/09/2019 05/13/2019 11/06/2018  Total Protein 6.0 - 8.3 g/dL - 7.1 7.7  Albumin 3.5 - 5.0 4.4 4.3 4.6  AST 13 - 35 '14 18 30  ' ALT 7 - 35 10 13 47(H)  Alk Phosphatase 39 - 117 U/L - 63 61  Total Bilirubin 0.2 - 1.2 mg/dL - 0.7 0.6  Bilirubin, Direct 0.0 - 0.3 mg/dL - 0.1 -     The 10-year ASCVD risk score Mikey Bussing DC Jr., et al., 2013) is: 19.2%   Values used to calculate the score:     Age: 23 years     Sex: Female     Is Non-Hispanic African American: Yes     Diabetic: No     Tobacco smoker: No     Systolic Blood  Pressure: 172 mmHg     Is BP treated: Yes     HDL Cholesterol: 59.5 mg/dL     Total Cholesterol: 167 mg/dL   Patient has failed these meds in past: n/a Patient is currently controlled on the following medications:  . Aspirin 81 mg daily . Pravastatin 40 mg 1/2 tablet daily (nightly)   We discussed:  diet and exercise extensively. Patient still adherent and tolerating pravastatin well.   Plan  Continue current medications  IBS   Managed by Dr. Fuller Plan   Patient has failed these meds in past: n/a Patient is currently controlled on the following medications:  . None  We discussed:  Patient discontinued Linzess due to cost and not noticing it had made a huge difference in her IBS symptoms. She has been trying to follow a high-fiber diet.   Plan  Continue control with diet and exercise   Allergic Rhinitis   Patient has failed these meds in past: n/a Patient is currently controlled on the following medications:  . Cetirizine 5 mg QHS PRN . Flonase 50 mcg/act 2 spray daily PRN  We discussed:  Mainly has seasonal challenges during fall and spring.  Plan  Continue current medications   Misc / OTC   . Alpha Lipoic Acid daily  . Biotin 10 mg daily (not taking)  . Vitamin D 1000 units 2 tabs daily  . CoQ10 100 mg daily  . Estradiol vaginal cream . CBD Gummy 10 mg nightly - Sleep  . Vitamin C Chewables 500 mg 1 daily   Plan  Medication Management   [facility] Uses pill box? Yes  We discussed: Current pharmacy is preferred with insurance plan and patient is satisfied with pharmacy services  Plan  Continue current medication management strategy  Follow up: 2 month phone visit  Doristine Section Clinical Pharmacist [facility] at [facility]  [phone]

## 2020-09-28 NOTE — Patient Instructions (Signed)
Visit Information It was great speaking with you today!  Please let me know if you have any questions about our visit. Goals Addressed            This Visit's Progress   . Chronic Care Management   On track    CARE PLAN ENTRY (see longitudinal plan of care for additional care plan information)  Current Barriers:  . Chronic Disease Management support, education, and care coordination needs related to Hypertension, Hyperlipidemia, GERD, Anxiety, Allergic Rhinitis and Irritable Bowel Syndrome and Carcinoma    Hypertension BP Readings from Last 3 Encounters:  04/01/20 (!) 160/82  02/24/20 (!) 165/94  07/18/19 (!) 142/82   . Pharmacist Clinical Goal(s): o Over the next 90 days, patient will work with PharmD and providers to achieve BP goal <130/80 . Current regimen:  o Losartan 50 mg 1-2 tablets daily . Interventions: o Discussed low salt diet and exercising as tolerated extensively o Will initiate blood pressure monitoring plan  . Patient self care activities - Over the next 90 days, patient will: o Check Blood Pressure 3-5 times weekly, document, and provide at future appointments o Ensure daily salt intake < 2300 mg/day  Hyperlipidemia Lab Results  Component Value Date/Time   LDLCALC 94 11/06/2018 10:08 AM   LDLDIRECT 149.6 07/08/2007 11:25 AM   . Pharmacist Clinical Goal(s): o Over the next 90 days, patient will work with PharmD and providers to maintain LDL goal < 100 . Current regimen:  o Pravastatin 40 mg 1/2 tablet daily  . Interventions: o Discussed low cholesterol diet and exercising as tolerated extensively o Will initiate cholesterol monitoring plan   Medication management . Pharmacist Clinical Goal(s): o Over the next 90 days, patient will work with PharmD and providers to maintain optimal medication adherence . [facility] . Interventions o Comprehensive medication review performed. o Continue current medication management strategy . Patient  self care activities - Over the next 90 days, patient will: o Take medications as prescribed o Report any questions or concerns to PharmD and/or provider(s)       The patient verbalized understanding of instructions, educational materials, and care plan provided today and agreed to receive a mailed copy of patient instructions, educational materials, and care plan.   Telephone follow up appointment with pharmacy team member scheduled for: 11/30/20 at 9:30 AM   [place] [facility]  [phone]

## 2020-10-04 NOTE — Progress Notes (Signed)
She has been taking losartan 100 mg daily consistently since her blood pressure started being elevated.  Heidi [name] could one of you two please reach out to the patient and ask her to schedule a visit with [place] or another provider if necessary this week to discuss her blood pressure?  Thanks, Doristine Section Clinical Pharmacist [place] [facility] at [facility]  [phone]

## 2020-10-06 ENCOUNTER — Ambulatory Visit (INDEPENDENT_AMBULATORY_CARE_PROVIDER_SITE_OTHER): Payer: Medicare HMO | Admitting: Nurse Practitioner

## 2020-10-06 ENCOUNTER — Other Ambulatory Visit: Payer: Self-pay

## 2020-10-06 ENCOUNTER — Encounter: Payer: Self-pay | Admitting: Nurse Practitioner

## 2020-10-06 VITALS — BP 138/74 | HR 84 | Temp 96.6°F | Ht 65.0 in | Wt 181.0 lb

## 2020-10-06 DIAGNOSIS — I10 Essential (primary) hypertension: Secondary | ICD-10-CM

## 2020-10-06 DIAGNOSIS — C7B8 Other secondary neuroendocrine tumors: Secondary | ICD-10-CM

## 2020-10-06 DIAGNOSIS — C7A8 Other malignant neuroendocrine tumors: Secondary | ICD-10-CM

## 2020-10-06 MED ORDER — LOSARTAN POTASSIUM 100 MG PO TABS: 100.0000 mg | ORAL_TABLET | Freq: Every day | ORAL | 3 refills | Status: DC

## 2020-10-06 NOTE — Assessment & Plan Note (Signed)
Elevated SBP reading with wrist cuff: 150s-160s/80s Asymptomatic Admits to high sodium diet, minimal oral hydration and lack of exercise. BP within range today BP Readings from Last 3 Encounters:  10/06/20 138/74  04/01/20 (!) 160/82  02/24/20 (!) 165/94   Maintain losartan at 100mg  Advised about the imporatnce of DASH diet, adequate oral hydration and regular exercise. Also recommended switching to upper arm BP machine. F/up in 45month

## 2020-10-06 NOTE — Assessment & Plan Note (Signed)
Chronic constipation, stable with use of colace and increase fiber. No ABD pain/nausea/blood in stool. [facility] and Dr. Lennie Odor (Gen. Surgery). Last OV 08/27/2020 and 07/07/2020 respectively.  PET scan 06/2019: negative for metastasis S/p exploratory laparotomy with SBR, BSO, appendectomy, and cholecystectomy on 08/07/2019 for a mesenteric mass. 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 The BSO, appendectomy, and cholecystectomy specimens were benign.

## 2020-10-06 NOTE — Progress Notes (Signed)
Subjective:  Patient ID: Heidi [name], female    DOB: [birth year]  Age: 74 y.o. MRN: [id number]  CC: Follow-up (Pt has been checking BP at home and states the highest she has seen it is 163/84 and the lowest has been 108/80. )  HPI  Essential hypertension Elevated SBP reading with wrist cuff: 150s-160s/80s Asymptomatic Admits to high sodium diet, minimal oral hydration and lack of exercise. BP within range today BP Readings from Last 3 Encounters:  10/06/20 138/74  04/01/20 (!) 160/82  02/24/20 (!) 165/94   Maintain losartan at 100mg  Advised about the imporatnce of DASH diet, adequate oral hydration and regular exercise. Also recommended switching to upper arm BP machine. 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 No ABD pain/nausea/blood in stool. [facility] and Dr. Lennie Odor (Gen. Surgery). Last OV 08/27/2020 and 07/07/2020 respectively.  PET scan 06/2019: negative for metastasis S/p exploratory laparotomy with SBR, BSO, appendectomy, and cholecystectomy on 08/07/2019 for a mesenteric mass. 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 The BSO, appendectomy, and cholecystectomy specimens were benign.     Reviewed past Medical, Social and Family history today.  Outpatient Medications Prior to Visit  Medication Sig Dispense Refill   ALPHA LIPOIC ACID PO Take by mouth at bedtime.     Ascorbic Acid (SM CHEWABLE VITAMIN C) 500 MG CHEW Chew 500 mg by mouth daily.     aspirin 81 MG tablet Take 81 mg by mouth at bedtime.     Biotin 10 MG TABS Take by mouth daily.     cetirizine (ZYRTEC) 5 MG tablet Take 1 tablet (5 mg total) by mouth at bedtime. (Patient taking differently: Take 5 mg by mouth daily.)     cholecalciferol (VITAMIN D) 1000 units  tablet Take 2 tablets daily. (Patient taking differently: 1,000 Units daily.) 30 tablet 3   Coenzyme Q10 (COQ10) 100 MG CAPS Take by mouth at bedtime.     estradiol (ESTRACE) 0.1 MG/GM vaginal cream Place vaginally every other day.      fluticasone (FLONASE) 50 MCG/ACT nasal spray Place 2 sprays into both nostrils daily. (Patient taking differently: Place 2 sprays into both nostrils daily as needed.) 16 g 3   NON FORMULARY CBD Gummy 10 mg nighty     pravastatin (PRAVACHOL) 40 MG tablet TAKE 1/2 TABLET (20MG  TOTAL) DAILY. Need office visit for additional refills 45 tablet 1   losartan (COZAAR) 50 MG tablet Take 1-2 tablets (50-100 mg total) by mouth daily. 1tab if BP<149/80 and 2tabs if BP >150/90. Need office visit for additional refills 180 tablet 1   No facility-administered medications prior to visit.    ROS See HPI  Objective:  BP 138/74 (BP Location: Left Arm, Patient Position: Sitting, Cuff Size: Normal)    Pulse 84    Temp (!) 96.6 F (35.9 C) (Temporal)    Ht 5\' 5"  (1.651 m)    Wt 181 lb (82.1 kg)    LMP 10/23/1994 (Approximate)    SpO2 99%    BMI 30.12 kg/m   Physical Exam Constitutional:      Appearance: She is obese.  Cardiovascular:     Rate and Rhythm: Normal rate.     Pulses: Normal pulses.  Pulmonary:     Effort: Pulmonary effort is normal.  Abdominal:     General:  There is no distension.     Palpations: Abdomen is soft.     Tenderness: There is no abdominal tenderness.  Musculoskeletal:     Right lower leg: No edema.     Left lower leg: No edema.  Neurological:     Mental Status: She is alert and oriented to person, place, and time.     Assessment & Plan:  This visit occurred during the SARS-CoV-2 public health emergency.  Safety protocols were in place, including screening questions prior to the visit, additional usage of staff PPE, and extensive cleaning of exam room while observing appropriate contact time as indicated for disinfecting solutions.    Belva was seen today for follow-up.  Diagnoses and all orders for this visit:  Essential hypertension -     losartan (COZAAR) 100 MG tablet; Take 1 tablet (100 mg total) by mouth daily.  Neuroendocrine carcinoma metastatic to intra-abdominal lymph node ([place])   Problem List Items Addressed This Visit      Cardiovascular and Mediastinum   Essential hypertension - Primary    Elevated SBP reading with wrist cuff: 150s-160s/80s Asymptomatic Admits to high sodium diet, minimal oral hydration and lack of exercise. BP within range today BP Readings from Last 3 Encounters:  10/06/20 138/74  04/01/20 (!) 160/82  02/24/20 (!) 165/94   Maintain losartan at 100mg  Advised about the imporatnce of DASH diet, adequate oral hydration and regular exercise. Also recommended switching to upper arm BP machine. 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 No ABD pain/nausea/blood in stool. [facility] and Dr. Lennie Odor (Gen. Surgery). Last OV 08/27/2020 and 07/07/2020 respectively.  PET scan 06/2019: negative for metastasis S/p exploratory laparotomy with SBR, BSO, appendectomy, and cholecystectomy on 08/07/2019 for a mesenteric mass. 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 The BSO, appendectomy, and cholecystectomy specimens were benign.           Follow-up: Return in about 4 weeks (around 11/03/2020) for HTN and , hyperlipidemia (fasting).  Wilfred Lacy, NP

## 2020-10-06 NOTE — Patient Instructions (Signed)
Maintain losartan dose at 100mg  daily Continue to monitor BP at home with upper arm cuffcall office if >150/80. Maintain DASH diet, adequate oral hydration and regular exercise.

## 2020-10-11 ENCOUNTER — Telehealth: Payer: Self-pay

## 2020-10-11 NOTE — Progress Notes (Signed)
° ° °  Chronic Care Management Pharmacy Assistant   Name: Heidi [name]  MRN: [id number] DOB: 1945/12/29  Reason for Encounter: Medication Review/Patient Assistance for [place].  PCP : Flossie Buffy, NP  Allergies:  No Known Allergies  Medications: Outpatient Encounter Medications as of 10/11/2020  Medication Sig   ALPHA LIPOIC ACID PO Take by mouth at bedtime.   Ascorbic Acid (SM CHEWABLE VITAMIN C) 500 MG CHEW Chew 500 mg by mouth daily.   aspirin 81 MG tablet Take 81 mg by mouth at bedtime.   Biotin 10 MG TABS Take by mouth daily.   cetirizine (ZYRTEC) 5 MG tablet Take 1 tablet (5 mg total) by mouth at bedtime. (Patient taking differently: Take 5 mg by mouth daily.)   cholecalciferol (VITAMIN D) 1000 units tablet Take 2 tablets daily. (Patient taking differently: 1,000 Units daily.)   Coenzyme Q10 (COQ10) 100 MG CAPS Take by mouth at bedtime.   estradiol (ESTRACE) 0.1 MG/GM vaginal cream Place vaginally every other day.    fluticasone (FLONASE) 50 MCG/ACT nasal spray Place 2 sprays into both nostrils daily. (Patient taking differently: Place 2 sprays into both nostrils daily as needed.)   losartan (COZAAR) 100 MG tablet Take 1 tablet (100 mg total) by mouth daily.   NON FORMULARY CBD Gummy 10 mg nighty   pravastatin (PRAVACHOL) 40 MG tablet TAKE 1/2 TABLET (20MG  TOTAL) DAILY. Need office visit for additional refills   No facility-administered encounter medications on file as of 10/11/2020.    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  Why Pharmacist Assistant [phone]

## 2020-10-12 ENCOUNTER — Telehealth: Payer: Self-pay

## 2020-10-12 NOTE — Progress Notes (Signed)
Chronic Care Management Pharmacy Assistant   Name: Heidi [name]  MRN: [id number] DOB: [birth year]  Reason for Encounter:Hypertension Disease State Call. Marland Kitchen  PCP : Heidi Buffy, NP  Allergies:  No Known Allergies  Medications: Outpatient Encounter Medications as of 10/12/2020  Medication Sig  . ALPHA LIPOIC ACID PO Take by mouth at bedtime.  . Ascorbic Acid (SM CHEWABLE VITAMIN C) 500 MG CHEW Chew 500 mg by mouth daily.  Marland Kitchen aspirin 81 MG tablet Take 81 mg by mouth at bedtime.  . Biotin 10 MG TABS Take by mouth daily.  . cetirizine (ZYRTEC) 5 MG tablet Take 1 tablet (5 mg total) by mouth at bedtime. (Patient taking differently: Take 5 mg by mouth daily.)  . cholecalciferol (VITAMIN D) 1000 units tablet Take 2 tablets daily. (Patient taking differently: 1,000 Units daily.)  . Coenzyme Q10 (COQ10) 100 MG CAPS Take by mouth at bedtime.  Marland Kitchen estradiol (ESTRACE) 0.1 MG/GM vaginal cream Place vaginally every other day.   . fluticasone (FLONASE) 50 MCG/ACT nasal spray Place 2 sprays into both nostrils daily. (Patient taking differently: Place 2 sprays into both nostrils daily as needed.)  . losartan (COZAAR) 100 MG tablet Take 1 tablet (100 mg total) by mouth daily.  . NON FORMULARY CBD Gummy 10 mg nighty  . pravastatin (PRAVACHOL) 40 MG tablet TAKE 1/2 TABLET (20MG  TOTAL) DAILY. Need office visit for additional refills   No facility-administered encounter medications on file as of 10/12/2020.    Current Diagnosis: Patient Active Problem List   Diagnosis Date Noted  . 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 Mesenteric mass 06/08/2016  . Heel spur 04/19/2015  . CHRONIC RHINITIS 01/12/2009  . Malignant neoplasm of kidney excluding renal pelvis ([place]) 01/11/2008  . Anxiety state 01/11/2008  . ACUTE CYSTITIS 01/11/2008  . Osteoarthritis 01/11/2008  . HEADACHE 01/11/2008  . HYPERCHOLESTEROLEMIA  01/02/2008  . Essential hypertension 01/02/2008  . Venous (peripheral) insufficiency 01/02/2008  . IRRITABLE BOWEL SYNDROME 01/02/2008    Goals Addressed   None    Reviewed chart prior to disease state call. Spoke with patient regarding BP  Recent Office Vitals: BP Readings from Last 3 Encounters:  10/06/20 138/74  04/01/20 (!) 160/82  02/24/20 (!) 165/94   Pulse Readings from Last 3 Encounters:  10/06/20 84  04/01/20 66  02/24/20 62    Wt Readings from Last 3 Encounters:  10/06/20 181 lb (82.1 kg)  04/01/20 176 lb (79.8 kg)  02/24/20 176 lb (79.8 kg)     Kidney Function Lab Results  Component Value Date/Time   CREATININE 0.9 12/09/2019 12:00 AM   CREATININE 0.98 05/13/2019 10:55 AM   CREATININE 1.06 11/06/2018 10:08 AM   CREATININE 1.07 (H) 11/27/2017 11:43 AM   GFR 67.49 05/13/2019 10:55 AM   GFRNONAA 53 (L) 11/27/2017 11:43 AM   GFRAA 61 11/27/2017 11:43 AM    BMP Latest Ref Rng & Units 12/09/2019 05/13/2019 11/06/2018  Glucose 70 - 99 mg/dL - 96 91  BUN 4 - 21 12 11 13   Creatinine 0.5 - 1.1 0.9 0.98 1.06  BUN/Creat Ratio 6 - 22 (calc) - - -  Sodium 137 - 147 138 139 137  Potassium 3.4 - 5.3 3.9 4.3 3.3(L)  Chloride 96 - 112 mEq/L - 105 101  CO2 19 - 32 mEq/L - 26 27  Calcium 8.7 - 10.7 10.0 9.5 9.7    . Current antihypertensive regimen:  o Losartan  100 MG 1 Tablet Daily . How often are you checking your Blood Pressure? Patient states she is going to get s new cuff. . Current home BP readings: None ID . What recent interventions/DTPs have been made by any provider to improve Blood Pressure control since last CPP Visit:  losartan (COZAAR) 100 MG tablet; Take 1 tablet (100 mg total) by mouth daily . Any recent hospitalizations or ED visits since last visit with CPP? No   Adherence Review: Is the patient currently on ACE/ARB medication? Yes Does the patient have >5 day gap between last estimated fill dates? Yes   [name]   Follow-Up:   Pharmacist Review   [place] Clinical Pharmacist Assistant [phone]

## 2020-11-22 NOTE — Progress Notes (Signed)
Subjective:   Heidi [name] is a 75 y.o. female who presents for Medicare Annual (Subsequent) preventive examination.  I connected with Nesha today by telephone and verified that I am speaking with the correct person using two identifiers. Location patient: home Location provider: work Persons participating in the virtual visit: patient, Marine scientist.    I discussed the limitations, risks, security and privacy concerns of performing an evaluation and management service by telephone and the availability of in person appointments. I also discussed with the patient that there may be a patient responsible charge related to this service. The patient expressed understanding and verbally consented to this telephonic visit.    Interactive audio and video telecommunications were attempted between this provider and patient, however failed, due to patient having technical difficulties OR patient did not have access to video capability.  We continued and completed visit with audio only.  Some vital signs may be absent or patient reported.   Time Spent with patient on telephone encounter: 25 minutes   Review of Systems     Cardiac Risk Factors include: advanced age (>35men, >56 women);dyslipidemia;hypertension;obesity (BMI >30kg/m2)     Objective:    Today's Vitals   11/23/20 1415  Weight: 186 lb (84.4 kg)  Height: 5\' 5"  (1.651 m)   Body mass index is 30.95 kg/m.  Advanced Directives 11/23/2020 11/19/2019 11/13/2018  Does Patient Have a Medical Advance Directive? Yes No Yes  Type of Paramedic of [place];Living will - [place];Living will  Does patient want to make changes to medical advance directive? - - No - Patient declined  Copy of [place] in Chart? No - copy requested - No - copy requested  Would patient like information on creating a medical advance directive? - No - Patient declined -    Current Medications  (verified) Outpatient Encounter Medications as of 11/23/2020  Medication Sig  . ALPHA LIPOIC ACID PO Take by mouth at bedtime.  Marland Kitchen amLODipine (NORVASC) 10 MG tablet Take 1 tablet (10 mg total) by mouth at bedtime.  . Ascorbic Acid (SM CHEWABLE VITAMIN C) 500 MG CHEW Chew 500 mg by mouth daily.  Marland Kitchen aspirin 81 MG tablet Take 81 mg by mouth at bedtime.  . Biotin 10 MG TABS Take by mouth daily.  . cetirizine (ZYRTEC) 5 MG tablet Take 1 tablet (5 mg total) by mouth at bedtime. (Patient taking differently: Take 5 mg by mouth daily.)  . cholecalciferol (VITAMIN D) 1000 units tablet Take 2 tablets daily. (Patient taking differently: 1,000 Units daily. Pt states she take medication every other day.)  . Coenzyme Q10 (COQ10) 100 MG CAPS Take by mouth at bedtime.  Marland Kitchen estradiol (ESTRACE) 0.1 MG/GM vaginal cream Place vaginally every other day.   . fluticasone (FLONASE) 50 MCG/ACT nasal spray Place 2 sprays into both nostrils daily. (Patient taking differently: Place 2 sprays into both nostrils daily as needed.)  . losartan (COZAAR) 100 MG tablet Take 1 tablet (100 mg total) by mouth daily.  . NON FORMULARY CBD Gummy 10 mg nighty  . pravastatin (PRAVACHOL) 40 MG tablet TAKE 1/2 TABLET (20MG  TOTAL) DAILY. Need office visit for additional refills   No facility-administered encounter medications on file as of 11/23/2020.    Allergies (verified) Patient has no known allergies.   History: Past Medical History:  Diagnosis Date  . Anxiety   . Chronic rhinitis   . DJD (degenerative joint disease)   . GERD (gastroesophageal reflux disease)   .  History of headache   . HTN (hypertension)   . Hypercholesterolemia   . IBS (irritable bowel syndrome)   . Renal cell carcinoma 2001  . Small bowel mass   . Venous insufficiency    Past Surgical History:  Procedure Laterality Date  . NEPHRECTOMY  2001   right = for renal cell CA  . small bowel mass removal    . TOTAL ABDOMINAL HYSTERECTOMY    . TUBAL LIGATION      Family History  Problem Relation Age of Onset  . Healthy Mother   . Diabetes Mother   . Hyperlipidemia Mother   . Hypertension Mother   . Prostate cancer Father   . Cancer Father   . Breast cancer Sister   . Cancer - Other Brother        neck ? lymph nodes  . Colon cancer Neg Hx   . Pancreatic cancer Neg Hx   . Stomach cancer Neg Hx    Social History   Socioeconomic History  . Marital status: Divorced    Spouse name: Not on file  . Number of children: 2  . Years of education: Not on file  . Highest education level: Not on file  Occupational History  . Not on file  Tobacco Use  . Smoking status: Former Smoker    Packs/day: 0.25    Years: 10.00    Pack years: 2.50    Types: Cigarettes    Quit date: 10/24/1983    Years since quitting: 37.1  . Smokeless tobacco: Never Used  Vaping Use  . Vaping Use: Never used  Substance and Sexual Activity  . Alcohol use: Yes    Alcohol/week: 0.0 standard drinks    Comment: once a month  . Drug use: No  . Sexual activity: Yes    Partners: Male    Birth control/protection: Surgical    Comment: hysterectomy  Other Topics Concern  . Not on file  Social History Narrative   Exercises 3x per week   Caffeine use: 2 cups per day   3 siblings healthy   Social Determinants of Health   Financial Resource Strain: Low Risk   . Difficulty of Paying Living Expenses: Not hard at all  Food Insecurity: No Food Insecurity  . Worried About Charity fundraiser in the Last Year: Never true  . Ran Out of Food in the Last Year: Never true  Transportation Needs: No Transportation Needs  . Lack of Transportation (Medical): No  . Lack of Transportation (Non-Medical): No  Physical Activity: Sufficiently Active  . Days of Exercise per Week: 7 days  . Minutes of Exercise per Session: 30 min  Stress: No Stress Concern Present  . Feeling of Stress : Not at all  Social Connections: Socially Isolated  . Frequency of Communication with Friends and  Family: More than three times a week  . Frequency of Social Gatherings with Friends and Family: More than three times a week  . Attends Religious Services: Never  . Active Member of Clubs or Organizations: No  . Attends Archivist Meetings: Never  . Marital Status: Divorced    Tobacco Counseling Counseling given: Not Answered   Clinical Intake:  Pre-visit preparation completed: Yes  Pain : No/denies pain     Nutritional Status: BMI > 30  Obese Nutritional Risks: None Diabetes: No  How often do you need to have someone help you when you read instructions, pamphlets, or other written materials from your doctor or  pharmacy?: 1 - Never  Diabetic?No  Interpreter Needed?: No  Information entered by :: Caroleen Hamman LPN   Activities of Daily Living In your present state of health, do you have any difficulty performing the following activities: 11/23/2020  Hearing? N  Vision? N  Difficulty concentrating or making decisions? N  Walking or climbing stairs? N  Dressing or bathing? N  Doing errands, shopping? N  Preparing Food and eating ? N  Using the Toilet? N  In the past six months, have you accidently leaked urine? N  Do you have problems with loss of bowel control? N  Managing your Medications? N  Managing your Finances? N  Housekeeping or managing your Housekeeping? N  Some recent data might be hidden    Patient Care Team: Nche, Charlene Brooke, NP as PCP - General (Internal Medicine) Germaine Pomfret, [facility] as Pharmacist (Pharmacist)  Indicate any recent Medical Services you may have received from other than [facility] providers in the past year (date may be approximate).     Assessment:   This is a routine wellness examination for Tessah.  Hearing/Vision screen  Hearing Screening   125Hz  250Hz  500Hz  1000Hz  2000Hz  3000Hz  4000Hz  6000Hz  8000Hz   Right ear:           Left ear:           Comments: No issues  Vision Screening Comments: Wears glasses Last  eye exam-2021-[facility]  Dietary issues and exercise activities discussed: Current Exercise Habits: Home exercise routine, Type of exercise: walking, Time (Minutes): 30, Frequency (Times/Week): 7, Weekly Exercise (Minutes/Week): 210, Intensity: Mild, Exercise limited by: None identified  Goals    . Chronic Care Management     CARE PLAN ENTRY (see longitudinal plan of care for additional care plan information)  Current Barriers:  . Chronic Disease Management support, education, and care coordination needs related to Hypertension, Hyperlipidemia, GERD, Anxiety, Allergic Rhinitis and Irritable Bowel Syndrome and Carcinoma    Hypertension BP Readings from Last 3 Encounters:  04/01/20 (!) 160/82  02/24/20 (!) 165/94  07/18/19 (!) 142/82   . Pharmacist Clinical Goal(s): o Over the next 90 days, patient will work with PharmD and providers to achieve BP goal <130/80 . Current regimen:  o Losartan 50 mg 1-2 tablets daily . Interventions: o Discussed low salt diet and exercising as tolerated extensively o Will initiate blood pressure monitoring plan  . Patient self care activities - Over the next 90 days, patient will: o Check Blood Pressure 3-5 times weekly, document, and provide at future appointments o Ensure daily salt intake < 2300 mg/day  Hyperlipidemia Lab Results  Component Value Date/Time   LDLCALC 94 11/06/2018 10:08 AM   LDLDIRECT 149.6 07/08/2007 11:25 AM   . Pharmacist Clinical Goal(s): o Over the next 90 days, patient will work with PharmD and providers to maintain LDL goal < 100 . Current regimen:  o Pravastatin 40 mg 1/2 tablet daily  . Interventions: o Discussed low cholesterol diet and exercising as tolerated extensively o Will initiate cholesterol monitoring plan   Medication management . Pharmacist Clinical Goal(s): o Over the next 90 days, patient will work with PharmD and providers to maintain optimal medication adherence . Current pharmacy:  [facility] . Interventions o Comprehensive medication review performed. o Continue current medication management strategy . Patient self care activities - Over the next 90 days, patient will: o Take medications as prescribed o Report any questions or concerns to PharmD and/or provider(s)    . Patient Stated  Drink more water, increase activity & eat less salt    . Weight (lb) < 170 lb (77.1 kg)      Depression Screen PHQ 2/9 Scores 11/23/2020 11/19/2019 11/13/2018 11/06/2018 05/06/2018 11/08/2017 04/12/2017  PHQ - 2 Score 0 0 0 0 0 0 0    Fall Risk Fall Risk  11/23/2020 11/19/2019 11/13/2018 11/06/2018 05/06/2018  Falls in the past year? 1 0 0 0 No  Number falls in past yr: 0 0 - - -  Injury with Fall? 0 0 - - -  Follow up Falls prevention discussed Education provided;Falls prevention discussed - - -    FALL RISK PREVENTION PERTAINING TO THE HOME:  Any stairs in or around the home? No  Home free of loose throw rugs in walkways, pet beds, electrical cords, etc? Yes  Adequate lighting in your home to reduce risk of falls? Yes   ASSISTIVE DEVICES UTILIZED TO PREVENT FALLS:  Life alert? No  Use of a cane, walker or w/c? No  Grab bars in the bathroom? No  Shower chair or bench in shower? No  Elevated toilet seat or a handicapped toilet? No   TIMED UP AND GO:  Was the test performed? No . Phone visit   Cognitive Function: Normal cognitive status assessed by  this Nurse Health Advisor. No abnormalities found.   MMSE - Mini Mental State Exam 11/13/2018  Orientation to time 5  Orientation to Place 5  Registration 3  Attention/ Calculation 5  Recall 3  Language- name 2 objects 2  Language- repeat 1  Language- follow 3 step command 3  Language- read & follow direction 1  Write a sentence 1  Copy design 1  Total score 30        Immunizations Immunization History  Administered Date(s) Administered  . Fluad Quad(high Dose 65+) 06/19/2019  . Influenza Split 10/12/2011, 10/11/2012   . Influenza Whole 10/14/2009, 10/12/2010  . Influenza,inj,Quad PF,6+ Mos 08/27/2013, 10/14/2014, 11/24/2015, 10/02/2016  . Influenza-Unspecified 08/23/2017, 07/23/2018, 06/24/2020  . Moderna Sars-Covid-2 Vaccination 08/17/2020  . Pneumococcal Conjugate-13 12/11/2016  . Pneumococcal Polysaccharide-23 04/11/2012  . Td 11/23/2010    TDAP status: Up to date  Due 11/23/20  Flu Vaccine status: Up to date  Pneumococcal vaccine status: Up to date  Covid-19 vaccine status: Completed vaccines  Qualifies for Shingles Vaccine? Yes   Zostavax completed No   Shingrix Completed?: No.    Education has been provided regarding the importance of this vaccine. Patient has been advised to call insurance company to determine out of pocket expense if they have not yet received this vaccine. Advised may also receive vaccine at local pharmacy or Health Dept. Verbalized acceptance and understanding.  Screening Tests Health Maintenance  Topic Date Due  . PNA vac Low Risk Adult (2 of 2 - PPSV23) 12/11/2017  . COVID-19 Vaccine (2 - Moderna risk 4-dose series) 09/14/2020  . TETANUS/TDAP  11/23/2020  . MAMMOGRAM  03/19/2021  . COLONOSCOPY (Pts 45-37yrs Insurance coverage will need to be confirmed)  06/23/2029  . INFLUENZA VACCINE  Completed  . DEXA SCAN  Completed  . Hepatitis C Screening  Completed    Health Maintenance  Health Maintenance Due  Topic Date Due  . PNA vac Low Risk Adult (2 of 2 - PPSV23) 12/11/2017  . COVID-19 Vaccine (2 - Moderna risk 4-dose series) 09/14/2020  . TETANUS/TDAP  11/23/2020    Colorectal cancer screening: Type of screening: Colonoscopy. Completed 06/24/2019. Repeat every 10 years  Mammogram status: Completed 03/19/2020-Bilateral.  Repeat every year  Bone Density status: Completed 06/24/2019. Results reflect: Bone density results: NORMAL. Repeat every 2 years.  Lung Cancer Screening: (Low Dose CT Chest recommended if Age 71-80 years, 30 pack-year currently smoking OR have  quit w/in 15years.) does not qualify.    Additional Screening:  Hepatitis C Screening:Completed 12/13/2015  Vision Screening: Recommended annual ophthalmology exams for early detection of glaucoma and other disorders of the eye. Is the patient up to date with their annual eye exam?  Yes  Who is the provider or what is the name of the office in which the patient attends annual eye exams? [facility]   Dental Screening: Recommended annual dental exams for proper oral hygiene  Community Resource Referral / Chronic Care Management: CRR required this visit?  No   CCM required this visit?  No      Plan:     I have personally reviewed and noted the following in the patient's chart:   . Medical and social history . Use of alcohol, tobacco or illicit drugs  . Current medications and supplements . Functional ability and status . Nutritional status . Physical activity . Advanced directives . List of other physicians . Hospitalizations, surgeries, and ER visits in previous 12 months . Vitals . Screenings to include cognitive, depression, and falls . Referrals and appointments  In addition, I have reviewed and discussed with patient certain preventive protocols, quality metrics, and best practice recommendations. A written personalized care plan for preventive services as well as general preventive health recommendations were provided to patient.   Due to this being a telephonic visit, the after visit summary with patients personalized plan was offered to patient via mail or my-chart.  Patient would like to access on my-chart.   Marta Antu, LPN   01/25/4097  Nurse Health Advisor  Nurse Notes: None

## 2020-11-23 ENCOUNTER — Other Ambulatory Visit: Payer: Self-pay

## 2020-11-23 ENCOUNTER — Ambulatory Visit (INDEPENDENT_AMBULATORY_CARE_PROVIDER_SITE_OTHER): Payer: Medicare HMO

## 2020-11-23 ENCOUNTER — Ambulatory Visit (INDEPENDENT_AMBULATORY_CARE_PROVIDER_SITE_OTHER): Payer: Medicare HMO | Admitting: Nurse Practitioner

## 2020-11-23 ENCOUNTER — Encounter: Payer: Self-pay | Admitting: Nurse Practitioner

## 2020-11-23 VITALS — Ht 65.0 in | Wt 186.0 lb

## 2020-11-23 VITALS — BP 150/92 | HR 70 | Temp 97.3°F | Ht 65.0 in | Wt 186.0 lb

## 2020-11-23 DIAGNOSIS — I10 Essential (primary) hypertension: Secondary | ICD-10-CM | POA: Diagnosis not present

## 2020-11-23 DIAGNOSIS — Z0000 Encounter for general adult medical examination without abnormal findings: Secondary | ICD-10-CM

## 2020-11-23 DIAGNOSIS — E7800 Pure hypercholesterolemia, unspecified: Secondary | ICD-10-CM | POA: Diagnosis not present

## 2020-11-23 MED ORDER — AMLODIPINE BESYLATE 10 MG PO TABS: 10.0000 mg | ORAL_TABLET | Freq: Every day | ORAL | 5 refills | Status: DC

## 2020-11-23 NOTE — Patient Instructions (Signed)
Heidi [name] , Thank you for taking time to complete your Medicare Wellness Visit. I appreciate your ongoing commitment to your health goals. Please review the following plan we discussed and let me know if I can assist you in the future.   Screening recommendations/referrals: Colonoscopy: Completed 06/24/2019- Not required after age 74. Mammogram: Completed 03/19/20-Due 03/19/21 Bone Density: Completed 06/24/2019-Due 06/23/2021 Recommended yearly ophthalmology/optometry visit for glaucoma screening and checkup Recommended yearly dental visit for hygiene and checkup  Vaccinations: Influenza vaccine: Up to date Pneumococcal vaccine: Completed vaccines Tdap vaccine: Discuss with pharmacy Shingles vaccine: Discuss with pharmacy Covid-19:Completed vaccines  Advanced directives: Please bring a copy for your chart  Conditions/risks identified: See problem list  Next appointment: Follow up in one year for your annual wellness visit 11/29/2021 @ 2:15   Preventive Care 65 Years and Older, Female Preventive care refers to lifestyle choices and visits with your health care provider that can promote health and wellness. What does preventive care include?  A yearly physical exam. This is also called an annual well check.  Dental exams once or twice a year.  Routine eye exams. Ask your health care provider how often you should have your eyes checked.  Personal lifestyle choices, including:  Daily care of your teeth and gums.  Regular physical activity.  Eating a healthy diet.  Avoiding tobacco and drug use.  Limiting alcohol use.  Practicing safe sex.  Taking low-dose aspirin every day.  Taking vitamin and mineral supplements as recommended by your health care provider. What happens during an annual well check? The services and screenings done by your health care provider during your annual well check will depend on your age, overall health, lifestyle risk factors, and family history of  disease. Counseling  Your health care provider may ask you questions about your:  Alcohol use.  Tobacco use.  Drug use.  Emotional well-being.  Home and relationship well-being.  Sexual activity.  Eating habits.  History of falls.  Memory and ability to understand (cognition).  Work and work Statistician.  Reproductive health. Screening  You may have the following tests or measurements:  Height, weight, and BMI.  Blood pressure.  Lipid and cholesterol levels. These may be checked every 5 years, or more frequently if you are over 25 years old.  Skin check.  Lung cancer screening. You may have this screening every year starting at age 31 if you have a 30-pack-year history of smoking and currently smoke or have quit within the past 15 years.  Fecal occult blood test (FOBT) of the stool. You may have this test every year starting at age 57.  Flexible sigmoidoscopy or colonoscopy. You may have a sigmoidoscopy every 5 years or a colonoscopy every 10 years starting at age 62.  Hepatitis C blood test.  Hepatitis B blood test.  Sexually transmitted disease (STD) testing.  Diabetes screening. This is done by checking your blood sugar (glucose) after you have not eaten for a while (fasting). You may have this done every 1-3 years.  Bone density scan. This is done to screen for osteoporosis. You may have this done starting at age 58.  Mammogram. This may be done every 1-2 years. Talk to your health care provider about how often you should have regular mammograms. Talk with your health care provider about your test results, treatment options, and if necessary, the need for more tests. Vaccines  Your health care provider may recommend certain vaccines, such as:  Influenza vaccine. This is recommended every  year.  Tetanus, diphtheria, and acellular pertussis (Tdap, Td) vaccine. You may need a Td booster every 10 years.  Zoster vaccine. You may need this after age  42.  Pneumococcal 13-valent conjugate (PCV13) vaccine. One dose is recommended after age 54.  Pneumococcal polysaccharide (PPSV23) vaccine. One dose is recommended after age 89. Talk to your health care provider about which screenings and vaccines you need and how often you need them. This information is not intended to replace advice given to you by your health care provider. Make sure you discuss any questions you have with your health care provider. Document Released: 11/05/2015 Document Revised: 06/28/2016 Document Reviewed: 08/10/2015 Elsevier Interactive Patient Education  2017 [employer] Prevention in the Home Falls can cause injuries. They can happen to people of all ages. There are many things you can do to make your home safe and to help prevent falls. What can I do on the outside of my home?  Regularly fix the edges of walkways and driveways and fix any cracks.  Remove anything that might make you trip as you walk through a door, such as a raised step or threshold.  Trim any bushes or trees on the path to your home.  Use bright outdoor lighting.  Clear any walking paths of anything that might make someone trip, such as rocks or tools.  Regularly check to see if handrails are loose or broken. Make sure that both sides of any steps have handrails.  Any raised decks and porches should have guardrails on the edges.  Have any leaves, snow, or ice cleared regularly.  Use sand or salt on walking paths during winter.  Clean up any spills in your garage right away. This includes oil or grease spills. What can I do in the bathroom?  Use night lights.  Install grab bars by the toilet and in the tub and shower. Do not use towel bars as grab bars.  Use non-skid mats or decals in the tub or shower.  If you need to sit down in the shower, use a plastic, non-slip stool.  Keep the floor dry. Clean up any water that spills on the floor as soon as it happens.  Remove  soap buildup in the tub or shower regularly.  Attach bath mats securely with double-sided non-slip rug tape.  Do not have throw rugs and other things on the floor that can make you trip. What can I do in the bedroom?  Use night lights.  Make sure that you have a light by your bed that is easy to reach.  Do not use any sheets or blankets that are too big for your bed. They should not hang down onto the floor.  Have a firm chair that has side arms. You can use this for support while you get dressed.  Do not have throw rugs and other things on the floor that can make you trip. What can I do in the kitchen?  Clean up any spills right away.  Avoid walking on wet floors.  Keep items that you use a lot in easy-to-reach places.  If you need to reach something above you, use a strong step stool that has a grab bar.  Keep electrical cords out of the way.  Do not use floor polish or wax that makes floors slippery. If you must use wax, use non-skid floor wax.  Do not have throw rugs and other things on the floor that can make you trip. What can  I do with my stairs?  Do not leave any items on the stairs.  Make sure that there are handrails on both sides of the stairs and use them. Fix handrails that are broken or loose. Make sure that handrails are as long as the stairways.  Check any carpeting to make sure that it is firmly attached to the stairs. Fix any carpet that is loose or worn.  Avoid having throw rugs at the top or bottom of the stairs. If you do have throw rugs, attach them to the floor with carpet tape.  Make sure that you have a light switch at the top of the stairs and the bottom of the stairs. If you do not have them, ask someone to add them for you. What else can I do to help prevent falls?  Wear shoes that:  Do not have high heels.  Have rubber bottoms.  Are comfortable and fit you well.  Are closed at the toe. Do not wear sandals.  If you use a  stepladder:  Make sure that it is fully opened. Do not climb a closed stepladder.  Make sure that both sides of the stepladder are locked into place.  Ask someone to hold it for you, if possible.  Clearly mark and make sure that you can see:  Any grab bars or handrails.  First and last steps.  Where the edge of each step is.  Use tools that help you move around (mobility aids) if they are needed. These include:  Canes.  Walkers.  Scooters.  Crutches.  Turn on the lights when you go into a dark area. Replace any light bulbs as soon as they burn out.  Set up your furniture so you have a clear path. Avoid moving your furniture around.  If any of your floors are uneven, fix them.  If there are any pets around you, be aware of where they are.  Review your medicines with your doctor. Some medicines can make you feel dizzy. This can increase your chance of falling. Ask your doctor what other things that you can do to help prevent falls. This information is not intended to replace advice given to you by your health care provider. Make sure you discuss any questions you have with your health care provider. Document Released: 08/05/2009 Document Revised: 03/16/2016 Document Reviewed: 11/13/2014 Elsevier Interactive Patient Education  2017 [employer].

## 2020-11-23 NOTE — Assessment & Plan Note (Addendum)
Uncontrolled BP with losartan 100mg  Home BP readings 140s/80s-150s/100s, associated with intermittent headache BP Readings from Last 3 Encounters:  11/23/20 (!) 150/92  10/06/20 138/74  04/01/20 (!) 160/82   Maintain losartan dose in AM Start amlodipine 10mg  in PM Maintain DASH diet Repeat BMP and TSH F/up in 2weeks

## 2020-11-23 NOTE — Progress Notes (Signed)
Subjective:  Patient ID: Heidi [name], female    DOB: Oct 26, 1945  Age: 75 y.o. MRN: [id number]  CC: Follow-up (4 week f/u on HTN and hyperlipidemia. Pt states she checks BP at home and has brought in a log to show the provider. )  HPI  Essential hypertension Uncontrolled BP with losartan 100mg  Home BP readings 140s/80s-150s/100s, associated with intermittent headache BP Readings from Last 3 Encounters:  11/23/20 (!) 150/92  10/06/20 138/74  04/01/20 (!) 160/82   Maintain losartan dose in AM Start amlodipine 10mg  in PM Maintain DASH diet Repeat BMP and TSH F/up in 2weeks   BP Readings from Last 3 Encounters:  11/23/20 (!) 150/92  10/06/20 138/74  04/01/20 (!) 160/82   Wt Readings from Last 3 Encounters:  11/23/20 186 lb (84.4 kg)  10/06/20 181 lb (82.1 kg)  04/01/20 176 lb (79.8 kg)   Reviewed past Medical, Social and Family history today.  Outpatient Medications Prior to Visit  Medication Sig Dispense Refill  . ALPHA LIPOIC ACID PO Take by mouth at bedtime.    . Ascorbic Acid (SM CHEWABLE VITAMIN C) 500 MG CHEW Chew 500 mg by mouth daily.    Marland Kitchen aspirin 81 MG tablet Take 81 mg by mouth at bedtime.    . Biotin 10 MG TABS Take by mouth daily.    . cetirizine (ZYRTEC) 5 MG tablet Take 1 tablet (5 mg total) by mouth at bedtime. (Patient taking differently: Take 5 mg by mouth daily.)    . cholecalciferol (VITAMIN D) 1000 units tablet Take 2 tablets daily. (Patient taking differently: 1,000 Units daily. Pt states she take medication every other day.) 30 tablet 3  . Coenzyme Q10 (COQ10) 100 MG CAPS Take by mouth at bedtime.    Marland Kitchen estradiol (ESTRACE) 0.1 MG/GM vaginal cream Place vaginally every other day.     . fluticasone (FLONASE) 50 MCG/ACT nasal spray Place 2 sprays into both nostrils daily. (Patient taking differently: Place 2 sprays into both nostrils daily as needed.) 16 g 3  . losartan (COZAAR) 100 MG tablet Take 1 tablet (100 mg total) by mouth daily. 90 tablet 3  .  NON FORMULARY CBD Gummy 10 mg nighty    . pravastatin (PRAVACHOL) 40 MG tablet TAKE 1/2 TABLET (20MG  TOTAL) DAILY. Need office visit for additional refills 45 tablet 1   No facility-administered medications prior to visit.    ROS See HPI  Objective:  BP (!) 150/92 (BP Location: Left Arm, Patient Position: Sitting, Cuff Size: Normal)   Pulse 70   Temp (!) 97.3 F (36.3 C) (Temporal)   Ht 5\' 5"  (1.651 m)   Wt 186 lb (84.4 kg)   LMP 10/23/1994 (Approximate)   SpO2 99%   BMI 30.95 kg/m   Physical Exam Constitutional:      Appearance: She is obese.  Cardiovascular:     Rate and Rhythm: Normal rate and regular rhythm.     Pulses: Normal pulses.     Heart sounds: Normal heart sounds.  Musculoskeletal:     Right lower leg: No edema.     Left lower leg: No edema.  Neurological:     Mental Status: She is alert and oriented to person, place, and time.    Assessment & Plan:  This visit occurred during the SARS-CoV-2 public health emergency.  Safety protocols were in place, including screening questions prior to the visit, additional usage of staff PPE, and extensive cleaning of exam room while observing appropriate contact time as indicated  for disinfecting solutions.   Kanitra was seen today for follow-up.  Diagnoses and all orders for this visit:  Essential hypertension -     Cancel: Basic metabolic panel -     Cancel: TSH -     amLODipine (NORVASC) 10 MG tablet; Take 1 tablet (10 mg total) by mouth at bedtime. -     Basic metabolic panel; Future -     TSH; Future  HYPERCHOLESTEROLEMIA -     Cancel: Lipid panel -     Lipid panel; Future    Problem List Items Addressed This Visit      Cardiovascular and Mediastinum   Essential hypertension - Primary    Uncontrolled BP with losartan 100mg  Home BP readings 140s/80s-150s/100s, associated with intermittent headache BP Readings from Last 3 Encounters:  11/23/20 (!) 150/92  10/06/20 138/74  04/01/20 (!) 160/82    Maintain losartan dose in AM Start amlodipine 10mg  in PM Maintain DASH diet Repeat BMP and TSH F/up in 2weeks       Relevant Medications   amLODipine (NORVASC) 10 MG tablet   Other Relevant Orders   Basic metabolic panel   TSH     Other   HYPERCHOLESTEROLEMIA   Relevant Medications   amLODipine (NORVASC) 10 MG tablet   Other Relevant Orders   Lipid panel      Follow-up: Return in about 2 weeks (around 12/07/2020) for HTN.  Wilfred Lacy, NP

## 2020-11-23 NOTE — Patient Instructions (Signed)
Go to lab for blood draw  Maintain losartan dose in AM Start amlodipine 10mg  in PM Continue BP check in Am and PM F/up in 2weeks

## 2020-11-24 ENCOUNTER — Encounter: Payer: Self-pay | Admitting: Nurse Practitioner

## 2020-11-24 ENCOUNTER — Other Ambulatory Visit (INDEPENDENT_AMBULATORY_CARE_PROVIDER_SITE_OTHER): Payer: Medicare HMO

## 2020-11-24 DIAGNOSIS — E7800 Pure hypercholesterolemia, unspecified: Secondary | ICD-10-CM

## 2020-11-24 DIAGNOSIS — I10 Essential (primary) hypertension: Secondary | ICD-10-CM

## 2020-11-24 LAB — BASIC METABOLIC PANEL
BUN: 12 mg/dL (ref 6–23)
CO2: 30 mEq/L (ref 19–32)
Calcium: 10 mg/dL (ref 8.4–10.5)
Chloride: 100 mEq/L (ref 96–112)
Creatinine, Ser: 0.9 mg/dL (ref 0.40–1.20)
GFR: 63.38 mL/min (ref >60.00)
Glucose, Bld: 146 mg/dL — ABNORMAL HIGH (ref 70–99)
Potassium: 4 mEq/L (ref 3.5–5.1)
Sodium: 138 mEq/L (ref 135–145)

## 2020-11-24 LAB — LIPID PANEL
Cholesterol: 163 mg/dL (ref 0–200)
HDL: 55.3 mg/dL (ref >39.00)
LDL Cholesterol: 93 mg/dL (ref 0–99)
NonHDL: 107.83
Total CHOL/HDL Ratio: 3
Triglycerides: 72 mg/dL (ref 0.0–149.0)
VLDL: 14.4 mg/dL (ref 0.0–40.0)

## 2020-11-24 LAB — TSH: TSH: 1.49 u[IU]/mL (ref 0.35–4.50)

## 2020-11-24 NOTE — Addendum Note (Signed)
Addended by: Lynnea Ferrier on: 11/24/2020 10:44 AM   Modules accepted: Orders

## 2020-11-29 ENCOUNTER — Telehealth: Payer: Self-pay

## 2020-11-29 NOTE — Progress Notes (Signed)
Spoke to patient to confirmed patient telephone appointment on 11/30/2020 for CCM at 9:30 am with Junius Argyle the Clinical pharmacist.   Patient Verbalized understanding .  [facility] Pharmacist Assistant [phone]

## 2020-11-30 ENCOUNTER — Ambulatory Visit: Payer: Medicare HMO

## 2020-11-30 DIAGNOSIS — I10 Essential (primary) hypertension: Secondary | ICD-10-CM

## 2020-11-30 DIAGNOSIS — E7800 Pure hypercholesterolemia, unspecified: Secondary | ICD-10-CM

## 2020-11-30 NOTE — Progress Notes (Signed)
Chronic Care Management Pharmacy Note  11/30/2020 Name:  Heidi [name] MRN:  [id number] DOB:  1946-01-21  Subjective: Heidi [name] is an 75 y.o. year old female who is a primary patient of Nche, Charlene Brooke, NP.  The CCM team was consulted for assistance with disease management and care coordination needs.    Engaged with patient by telephone for follow up visit in response to provider referral for pharmacy case management and/or care coordination services.   Consent to Services:  The patient was given the following information about Chronic Care Management services today, agreed to services, and gave verbal consent: 1. CCM service includes personalized support from designated clinical staff supervised by the primary care provider, including individualized plan of care and coordination with other care providers 2. 24/7 contact phone numbers for assistance for urgent and routine care needs. 3. Service will only be billed when office clinical staff spend 20 minutes or more in a month to coordinate care. 4. Only one practitioner may furnish and bill the service in a calendar month. 5.The patient may stop CCM services at any time (effective at the end of the month) by phone call to the office staff. 6. The patient will be responsible for cost sharing (co-pay) of up to 20% of the service fee (after annual deductible is met). Patient agreed to services and consent obtained.  Patient Care Team: Nche, Charlene Brooke, NP as PCP - General (Internal Medicine) Germaine Pomfret, [facility] as Pharmacist (Pharmacist)  Recent office visits: 11/23/20: Patient presented to [facility] for follow-up. Glucose elevated (non-fasting). BP in clinic 150/92, patient started on amlodipine 10 mg daily.  10/06/20: Patient presented to [facility] for follow-up. BP in clinic 138/74. Losartan changed to 100 mg daily.   Recent consult visits: None Noted  Objective:  Lab Results  Component Value Date   CREATININE  0.90 11/24/2020   BUN 12 11/24/2020   GFR 63.38 11/24/2020   GFRNONAA 53 (L) 11/27/2017   GFRAA 61 11/27/2017   NA 138 11/24/2020   K 4.0 11/24/2020   CALCIUM 10.0 11/24/2020   CO2 30 11/24/2020    Lab Results  Component Value Date/Time   HGBA1C 5.7 02/26/2020 08:34 AM   GFR 63.38 11/24/2020 10:44 AM   GFR 67.49 05/13/2019 10:55 AM    Last diabetic Eye exam: No results found for: HMDIABEYEEXA  Last diabetic Foot exam: No results found for: HMDIABFOOTEX   Lab Results  Component Value Date   CHOL 163 11/24/2020   HDL 55.30 11/24/2020   LDLCALC 93 11/24/2020   LDLDIRECT 149.6 07/08/2007   TRIG 72.0 11/24/2020   CHOLHDL 3 11/24/2020    Hepatic Function Latest Ref Rng & Units 12/09/2019 05/13/2019 11/06/2018  Total Protein 6.0 - 8.3 g/dL - 7.1 7.7  Albumin 3.5 - 5.0 4.4 4.3 4.6  AST 13 - 35 _0 ALT 7 - 35 10 13 47(H)  Alk Phosphatase 39 - 117 U/L - 63 61  Total Bilirubin 0.2 - 1.2 mg/dL - 0.7 0.6  Bilirubin, Direct 0.0 - 0.3 mg/dL - 0.1 -    Lab Results  Component Value Date/Time   TSH 1.49 11/24/2020 10:44 AM   TSH 1.92 07/21/2019 08:06 AM   FREET4 0.76 07/21/2019 08:06 AM    CBC Latest Ref Rng & Units 12/09/2019 11/27/2017 12/11/2016  WBC - 7.2 5.8 6.5  Hemoglobin 12.0 - 16.0 13.4 13.3 13.9  Hematocrit 36 - 46 41 40.2 41.8  Platelets 150 - 399 303 342.0  316.0    Lab Results  Component Value Date/Time   VD25OH 29.49 (L) 11/27/2017 11:43 AM   VD25OH 39.16 12/11/2016 10:54 AM    Clinical ASCVD: No  The 10-year ASCVD risk score Mikey Bussing DC Jr., et al., 2013) is: 15%   Values used to calculate the score:     Age: 78 years     Sex: Female     Is Non-Hispanic African American: Yes     Diabetic: No     Tobacco smoker: No     Systolic Blood Pressure: 888 mmHg     Is BP treated: Yes     HDL Cholesterol: 55.3 mg/dL     Total Cholesterol: 163 mg/dL    Depression screen [facility] 2/9 11/23/2020 11/19/2019 11/13/2018  Decreased Interest 0 0 0  Down, Depressed, Hopeless 0 0  0  PHQ - 2 Score 0 0 0     Social History   Tobacco Use  Smoking Status Former Smoker  . Packs/day: 0.25  . Years: 10.00  . Pack years: 2.50  . Types: Cigarettes  . Quit date: 10/24/1983  . Years since quitting: 37.1  Smokeless Tobacco Never Used   BP Readings from Last 3 Encounters:  11/23/20 (!) 150/92  10/06/20 138/74  04/01/20 (!) 160/82   Pulse Readings from Last 3 Encounters:  11/23/20 70  10/06/20 84  04/01/20 66   Wt Readings from Last 3 Encounters:  11/23/20 186 lb (84.4 kg)  11/23/20 186 lb (84.4 kg)  10/06/20 181 lb (82.1 kg)    Assessment/Interventions: Review of patient past medical history, allergies, medications, health status, including review of consultants reports, laboratory and other test data, was performed as part of comprehensive evaluation and provision of chronic care management services.   SDOH:  (Social Determinants of Health) assessments and interventions performed: Yes SDOH Interventions   Flowsheet Row Most Recent Value  SDOH Interventions   Financial Strain Interventions Intervention Not Indicated  Transportation Interventions Intervention Not Indicated      CCM Care Plan  No Known Allergies  Medications Reviewed Today    Reviewed by Marta Antu, LPN (Licensed Practical Nurse) on 11/23/20 at 1417  Med List Status: <None>  Medication Order Taking? Sig Documenting Provider Last Dose Status Informant  ALPHA LIPOIC ACID PO [id number]  Take by mouth at bedtime. [provider]  Active   amLODipine (NORVASC) 10 MG tablet [id number]  Take 1 tablet (10 mg total) by mouth at bedtime. Nche, Charlene Brooke, NP  Active   Ascorbic Acid (SM CHEWABLE VITAMIN C) 500 MG CHEW [id number]  Chew 500 mg by mouth daily. [provider]  Active   aspirin 81 MG tablet [id number]  Take 81 mg by mouth at bedtime. [provider]  Active   Biotin 10 MG TABS [id number]  Take by mouth daily. [provider]  Active   cetirizine  (ZYRTEC) 5 MG tablet [id number]  Take 1 tablet (5 mg total) by mouth at bedtime.  Patient taking differently: Take 5 mg by mouth daily.   Flossie Buffy, NP  Active   cholecalciferol (VITAMIN D) 1000 units tablet [id number]  Take 2 tablets daily.  Patient taking differently: 1,000 Units daily. Pt states she take medication every other day.   Noralee Space, MD  Active   Coenzyme Q10 (COQ10) 100 MG CAPS [phone]  Take by mouth at bedtime. [provider]  Active   estradiol (ESTRACE) 0.1 MG/GM vaginal cream [id number]  Place vaginally every other  day.  [provider]  Active   fluticasone (FLONASE) 50 MCG/ACT nasal spray [id number]  Place 2 sprays into both nostrils daily.  Patient taking differently: Place 2 sprays into both nostrils daily as needed.   Nche, Charlene Brooke, NP  Active   losartan (COZAAR) 100 MG tablet [id number]  Take 1 tablet (100 mg total) by mouth daily. Flossie Buffy, NP  Active   NON FORMULARY [id number]  CBD Gummy 10 mg nighty [provider]  Active   pravastatin (PRAVACHOL) 40 MG tablet [id number]  TAKE 1/2 TABLET (20MG TOTAL) DAILY. Need office visit for additional refills Nche, Charlene Brooke, NP  Active           Patient Active Problem List   Diagnosis Date Noted  . 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 Mesenteric mass 06/08/2016  . Heel spur 04/19/2015  . CHRONIC RHINITIS 01/12/2009  . Malignant neoplasm of kidney excluding renal pelvis ([place]) 01/11/2008  . ACUTE CYSTITIS 01/11/2008  . Osteoarthritis 01/11/2008  . HEADACHE 01/11/2008  . HYPERCHOLESTEROLEMIA 01/02/2008  . Essential hypertension 01/02/2008  . Venous (peripheral) insufficiency 01/02/2008  . IRRITABLE BOWEL SYNDROME 01/02/2008    Immunization History  Administered Date(s) Administered  . Fluad Quad(high Dose 65+) 06/19/2019  . Influenza Split 10/12/2011, 10/11/2012  .  Influenza Whole 10/14/2009, 10/12/2010  . Influenza,inj,Quad PF,6+ Mos 08/27/2013, 10/14/2014, 11/24/2015, 10/02/2016  . Influenza-Unspecified 08/23/2017, 07/23/2018, 06/24/2020  . Moderna Sars-Covid-2 Vaccination 08/17/2020  . Pneumococcal Conjugate-13 12/11/2016  . Pneumococcal Polysaccharide-23 04/11/2012  . Td 11/23/2010    Conditions to be addressed/monitored:  Hypertension, Hyperlipidemia, GERD, Anxiety, Allergic Rhinitis and Irritable Bowel Syndrome and History of Carcinoma    Care Plan : General Pharmacy (Adult)  Updates made by Germaine Pomfret, RPH since 11/30/2020 12:00 AM    Problem: Hypertension, Hyperlipidemia, GERD, Anxiety, Allergic Rhinitis and Irritable Bowel Syndrome and History of Carcinoma   Priority: High    Goal: Patient-Specific Goal   Start Date: 11/30/2020  Expected End Date: 05/30/2021  This Visit's Progress: On track  Priority: High  Note:   Current Barriers:  . No barriers currently noted  Pharmacist Clinical Goal(s):  Marland Kitchen Over the next 90 days, patient will achieve control of Blood pressure as evidenced by BP < 140/90 through collaboration with PharmD and provider.   Interventions: . 1:1 collaboration with Nche, Charlene Brooke, NP regarding development and update of comprehensive plan of care as evidenced by provider attestation and co-signature . Inter-disciplinary care team collaboration (see longitudinal plan of care) . Comprehensive medication review performed; medication list updated in electronic medical record  Hypertension (BP goal <140/90) -controlled -Current treatment: . Amlodipine 10 mg at bedtime  . Losartan 100 mg daily  -Medications previously tried: NA  -Current home readings: 125-139/84-95  -Current dietary habits: Trying to eat plenty of fruits and vegetables. Drinking plenty of water.  -Current exercise habits: Walking daily, exercise routines 3x daily  (low-impact, weights)  -Denies hypotensive/hypertensive symptoms -Reports  some stomach aches since starting Amlodipine.  -Educated on Daily salt intake goal < 2300 mg; -Counseled to monitor BP at home weekly, document, and provide log at future appointments -Recommended to continue current medication  Hyperlipidemia: (LDL goal < 100) -controlled -Current treatment: . Pravastatin 40 mg 1/2 tablet daily  -Medications previously tried: NA  -Educated on Benefits of statin for ASCVD risk reduction; Importance of limiting foods high in cholesterol; -Recommended to continue current medication  Patient Goals/Self-Care Activities .  Over the next 90 days, patient will:  - take medications as prescribed check blood pressure Weekly, document, and provide at future appointments  Follow Up Plan: Telephone follow up appointment with care management team member scheduled for: 05/31/21 at 9:00 AM      Medication Assistance: None required.  Patient affirms current coverage meets needs.  Patient's preferred pharmacy is:  [facility] [place] [place] [address] Phone: [phone] Fax: [place], [address] [place] [address] Phone: [phone] Fax: [phone]  Uses pill box? Yes Pt endorses 100% compliance  We discussed: Current pharmacy is preferred with insurance plan and patient is satisfied with pharmacy services Patient decided to: Continue current medication management strategy  Follow Up:  Patient agrees to Care Plan and Follow-up.  Plan: Telephone follow up appointment with care management team member scheduled for:  05/31/21 at 9:00 AM  [facility] at [facility]  [phone]

## 2020-11-30 NOTE — Patient Instructions (Signed)
Visit Information It was great speaking with you today!  Please let me know if you have any questions about our visit. Goals Addressed            This Visit's Progress   . Track and Manage My Blood Pressure-Hypertension       Timeframe:  Long-Range Goal Priority:  High Start Date:   11/30/2020                          Expected End Date:  05/31/2021                     Follow Up Date 02/19/2021    - check blood pressure daily    Why is this important?    You won't feel high blood pressure, but it can still hurt your blood vessels.   High blood pressure can cause heart or kidney problems. It can also cause a stroke.   Making lifestyle changes like losing a little weight or eating less salt will help.   Checking your blood pressure at home and at different times of the day can help to control blood pressure.   If the doctor prescribes medicine remember to take it the way the doctor ordered.   Call the office if you cannot afford the medicine or if there are questions about it.     Notes:        The patient verbalized understanding of instructions, educational materials, and care plan provided today and declined offer to receive copy of patient instructions, educational materials, and care plan.   Telephone follow up appointment with pharmacy team member scheduled for: 05/31/21 at 9:00 AM  [facility] at [facility]  [phone]

## 2020-12-07 ENCOUNTER — Other Ambulatory Visit: Payer: Self-pay

## 2020-12-08 ENCOUNTER — Encounter: Payer: Self-pay | Admitting: Nurse Practitioner

## 2020-12-08 ENCOUNTER — Ambulatory Visit (INDEPENDENT_AMBULATORY_CARE_PROVIDER_SITE_OTHER): Payer: Medicare HMO | Admitting: Nurse Practitioner

## 2020-12-08 VITALS — BP 114/80 | HR 68 | Temp 96.2°F | Ht 65.0 in | Wt 184.8 lb

## 2020-12-08 DIAGNOSIS — I10 Essential (primary) hypertension: Secondary | ICD-10-CM

## 2020-12-08 DIAGNOSIS — R739 Hyperglycemia, unspecified: Secondary | ICD-10-CM | POA: Diagnosis not present

## 2020-12-08 LAB — GLUCOSE, POCT (MANUAL RESULT ENTRY): POC Glucose: 95 mg/dl (ref 70–99)

## 2020-12-08 MED ORDER — AMLODIPINE BESYLATE 10 MG PO TABS: 10.0000 mg | ORAL_TABLET | Freq: Every day | ORAL | 3 refills | Status: DC

## 2020-12-08 NOTE — Patient Instructions (Signed)
Continue current medication Continue BP checking 2-3x/week in AM Hold medication if BP<100/70. Call office if BP >150/90.

## 2020-12-08 NOTE — Progress Notes (Signed)
Subjective:  Patient ID: Heidi [name], female    DOB: 27-Jul-1946  Age: 75 y.o. MRN: [id number]  CC: Follow-up (2 week f/u on HTN. Pt state she has noticed an improvement in BP at home, especially after exercising. )  HPI  Essential hypertension Improved BP with amlodipine and losartan She has also made dietary changes and start daily exercise. BP Readings from Last 3 Encounters:  12/08/20 114/80  11/23/20 (!) 150/92  10/06/20 138/74   Continue current medication Continue BP checking 2-3x/week in AM Advised to Hold medication if BP<100/70 and Call office if BP >150/90. F/up in 30months   Wt Readings from Last 3 Encounters:  12/08/20 184 lb 12.8 oz (83.8 kg)  11/23/20 186 lb (84.4 kg)  11/23/20 186 lb (84.4 kg)   Reviewed past Medical, Social and Family history today.  Outpatient Medications Prior to Visit  Medication Sig Dispense Refill  . ALPHA LIPOIC ACID PO Take by mouth at bedtime.    . Ascorbic Acid (SM CHEWABLE VITAMIN C) 500 MG CHEW Chew 500 mg by mouth daily.    Marland Kitchen aspirin 81 MG tablet Take 81 mg by mouth at bedtime.    . Biotin 10 MG TABS Take by mouth daily.    . cetirizine (ZYRTEC) 5 MG tablet Take 1 tablet (5 mg total) by mouth at bedtime. (Patient taking differently: Take 5 mg by mouth daily.)    . cholecalciferol (VITAMIN D) 1000 units tablet Take 2 tablets daily. (Patient taking differently: 1,000 Units daily. Pt states she take medication every other day.) 30 tablet 3  . Coenzyme Q10 (COQ10) 100 MG CAPS Take by mouth at bedtime.    Marland Kitchen estradiol (ESTRACE) 0.1 MG/GM vaginal cream Place vaginally every other day.     . fluticasone (FLONASE) 50 MCG/ACT nasal spray Place 2 sprays into both nostrils daily. (Patient taking differently: Place 2 sprays into both nostrils daily as needed.) 16 g 3  . losartan (COZAAR) 100 MG tablet Take 1 tablet (100 mg total) by mouth daily. 90 tablet 3  . NON FORMULARY CBD Gummy 10 mg nighty    . pravastatin (PRAVACHOL) 40 MG tablet  TAKE 1/2 TABLET (20MG  TOTAL) DAILY. Need office visit for additional refills 45 tablet 1  . amLODipine (NORVASC) 10 MG tablet Take 1 tablet (10 mg total) by mouth at bedtime. 30 tablet 5   No facility-administered medications prior to visit.    ROS See HPI  Objective:  BP 114/80 (BP Location: Left Arm, Patient Position: Sitting, Cuff Size: Large)   Pulse 68   Temp (!) 96.2 F (35.7 C) (Temporal)   Ht 5\' 5"  (1.651 m)   Wt 184 lb 12.8 oz (83.8 kg)   LMP 10/23/1994 (Approximate)   SpO2 98%   BMI 30.75 kg/m   Physical Exam  Assessment & Plan:  This visit occurred during the SARS-CoV-2 public health emergency.  Safety protocols were in place, including screening questions prior to the visit, additional usage of staff PPE, and extensive cleaning of exam room while observing appropriate contact time as indicated for disinfecting solutions.   Mercadies was seen today for follow-up.  Diagnoses and all orders for this visit:  Essential hypertension -     amLODipine (NORVASC) 10 MG tablet; Take 1 tablet (10 mg total) by mouth at bedtime.  Hyperglycemia -     POCT Glucose (CBG)    Problem List Items Addressed This Visit      Cardiovascular and Mediastinum   Essential hypertension - Primary  Improved BP with amlodipine and losartan She has also made dietary changes and start daily exercise. BP Readings from Last 3 Encounters:  12/08/20 114/80  11/23/20 (!) 150/92  10/06/20 138/74   Continue current medication Continue BP checking 2-3x/week in AM Advised to Hold medication if BP<100/70 and Call office if BP >150/90. F/up in 48months       Relevant Medications   amLODipine (NORVASC) 10 MG tablet    Other Visit Diagnoses    Hyperglycemia       Relevant Orders   POCT Glucose (CBG) (Completed)      Follow-up: Return in about 3 months (around 03/07/2021) for HTN.  Wilfred Lacy, NP

## 2020-12-08 NOTE — Assessment & Plan Note (Signed)
Improved BP with amlodipine and losartan She has also made dietary changes and start daily exercise. BP Readings from Last 3 Encounters:  12/08/20 114/80  11/23/20 (!) 150/92  10/06/20 138/74   Continue current medication Continue BP checking 2-3x/week in AM Advised to Hold medication if BP<100/70 and Call office if BP >150/90. F/up in 64months

## 2020-12-22 ENCOUNTER — Other Ambulatory Visit: Payer: Self-pay

## 2020-12-22 DIAGNOSIS — E7800 Pure hypercholesterolemia, unspecified: Secondary | ICD-10-CM

## 2020-12-22 MED ORDER — PRAVASTATIN SODIUM 40 MG PO TABS: ORAL_TABLET | ORAL | 1 refills | Status: DC

## 2021-01-03 DIAGNOSIS — C7A8 Other malignant neuroendocrine tumors: Secondary | ICD-10-CM | POA: Diagnosis not present

## 2021-01-03 DIAGNOSIS — R970 Elevated carcinoembryonic antigen [CEA]: Secondary | ICD-10-CM | POA: Diagnosis not present

## 2021-01-03 DIAGNOSIS — C7B8 Other secondary neuroendocrine tumors: Secondary | ICD-10-CM | POA: Diagnosis not present

## 2021-01-17 DIAGNOSIS — R8279 Other abnormal findings on microbiological examination of urine: Secondary | ICD-10-CM | POA: Diagnosis not present

## 2021-01-17 DIAGNOSIS — N3021 Other chronic cystitis with hematuria: Secondary | ICD-10-CM | POA: Diagnosis not present

## 2021-02-07 ENCOUNTER — Telehealth: Payer: Self-pay

## 2021-02-07 NOTE — Progress Notes (Signed)
Chronic Care Management Pharmacy Assistant   Name: Heidi [name]  MRN: [id number] DOB: 02-16-1946  Reason for Encounter:Hypertension Disease State Call.   Recent office visits:  12/08/2020 Wilfred Lacy NP PCP   Recent consult visits:  01/03/2021 [facility] visits:  None in previous 6 months  Medications: Outpatient Encounter Medications as of 02/07/2021  Medication Sig  . ALPHA LIPOIC ACID PO Take by mouth at bedtime.  Marland Kitchen amLODipine (NORVASC) 10 MG tablet Take 1 tablet (10 mg total) by mouth at bedtime.  . Ascorbic Acid (SM CHEWABLE VITAMIN C) 500 MG CHEW Chew 500 mg by mouth daily.  Marland Kitchen aspirin 81 MG tablet Take 81 mg by mouth at bedtime.  . Biotin 10 MG TABS Take by mouth daily.  . cetirizine (ZYRTEC) 5 MG tablet Take 1 tablet (5 mg total) by mouth at bedtime. (Patient taking differently: Take 5 mg by mouth daily.)  . cholecalciferol (VITAMIN D) 1000 units tablet Take 2 tablets daily. (Patient taking differently: 1,000 Units daily. Pt states she take medication every other day.)  . Coenzyme Q10 (COQ10) 100 MG CAPS Take by mouth at bedtime.  Marland Kitchen estradiol (ESTRACE) 0.1 MG/GM vaginal cream Place vaginally every other day.   . fluticasone (FLONASE) 50 MCG/ACT nasal spray Place 2 sprays into both nostrils daily. (Patient taking differently: Place 2 sprays into both nostrils daily as needed.)  . losartan (COZAAR) 100 MG tablet Take 1 tablet (100 mg total) by mouth daily.  . NON FORMULARY CBD Gummy 10 mg nighty  . pravastatin (PRAVACHOL) 40 MG tablet TAKE 1/2 TABLET (20MG  TOTAL) DAILY. Need office visit for additional refills   No facility-administered encounter medications on file as of 02/07/2021.    Star Rating Drugs: Losartan 100 mg last filled on 12/20/2020 for 90 day supply at [facility]. Pravastatin 40 mg last filled on 12/22/2020 for 90 day supply at [facility].  Reviewed chart prior to disease state call. Spoke with patient regarding  BP  Recent Office Vitals: BP Readings from Last 3 Encounters:  12/08/20 114/80  11/23/20 (!) 150/92  10/06/20 138/74   Pulse Readings from Last 3 Encounters:  12/08/20 68  11/23/20 70  10/06/20 84    Wt Readings from Last 3 Encounters:  12/08/20 184 lb 12.8 oz (83.8 kg)  11/23/20 186 lb (84.4 kg)  11/23/20 186 lb (84.4 kg)     Kidney Function Lab Results  Component Value Date/Time   CREATININE 0.90 11/24/2020 10:44 AM   CREATININE 0.9 12/09/2019 12:00 AM   CREATININE 0.98 05/13/2019 10:55 AM   CREATININE 1.07 (H) 11/27/2017 11:43 AM   GFR 63.38 11/24/2020 10:44 AM   GFRNONAA 53 (L) 11/27/2017 11:43 AM   GFRAA 61 11/27/2017 11:43 AM    BMP Latest Ref Rng & Units 11/24/2020 12/09/2019 05/13/2019  Glucose 70 - 99 mg/dL 146(H) - 96  BUN 6 - 23 mg/dL 12 12 11   Creatinine 0.40 - 1.20 mg/dL 0.90 0.9 0.98  BUN/Creat Ratio 6 - 22 (calc) - - -  Sodium 135 - 145 mEq/L 138 138 139  Potassium 3.5 - 5.1 mEq/L 4.0 3.9 4.3  Chloride 96 - 112 mEq/L 100 - 105  CO2 19 - 32 mEq/L 30 - 26  Calcium 8.4 - 10.5 mg/dL 10.0 10.0 9.5    . Current antihypertensive regimen:   Amlodipine 10 mg at bedtime   Losartan 100 mg daily   Patient reports she was working outside on 02/05/2021 and felt dizzny.Patient states  she went inside sat down and drink a glass of water.Patient states she felt better after drinking the water and relaxing. . How often are you checking your Blood Pressure? 3-5x per week . Current home BP readings:  o Patient states she usually check her blood pressure before she takes her blood pressure medications. o On 03/31 it was 106/81. o On 04/02 it was 127/85. o On 04/04 it was 137/92. o On 04/06 it was 139/84. o On 04/08 it was 139/88. o On 04/18 it was 137/92 before medications and after medications it was 118/85. . What recent interventions/DTPs have been made by any provider to improve Blood Pressure control since last CPP Visit: None ID . Any recent hospitalizations or  ED visits since last visit with CPP? No . What diet changes have been made to improve Blood Pressure Control?  o No change Indicated. . What exercise is being done to improve your Blood Pressure Control?  o Patient states she walks 3 times a week.Patient states she was on vacation this past week and walk daily on the beach.  Patient is asking if it is okay for her to take proline with her current medications without interacting with each other.Notified Clinical Pharmacist.   Adherence Review: Is the patient currently on ACE/ARB medication? Yes Does the patient have >5 day gap between last estimated fill dates? No  Anderson Malta Clinical Production designer, theatre/television/film [phone]

## 2021-02-21 ENCOUNTER — Other Ambulatory Visit: Payer: Self-pay | Admitting: Nurse Practitioner

## 2021-02-21 DIAGNOSIS — Z1231 Encounter for screening mammogram for malignant neoplasm of breast: Secondary | ICD-10-CM

## 2021-02-22 ENCOUNTER — Other Ambulatory Visit: Payer: Self-pay | Admitting: Nurse Practitioner

## 2021-02-22 DIAGNOSIS — Z1231 Encounter for screening mammogram for malignant neoplasm of breast: Secondary | ICD-10-CM

## 2021-02-23 DIAGNOSIS — Z1231 Encounter for screening mammogram for malignant neoplasm of breast: Secondary | ICD-10-CM

## 2021-02-24 DIAGNOSIS — C7B8 Other secondary neuroendocrine tumors: Secondary | ICD-10-CM | POA: Diagnosis not present

## 2021-02-24 DIAGNOSIS — C7A8 Other malignant neuroendocrine tumors: Secondary | ICD-10-CM | POA: Diagnosis not present

## 2021-02-24 DIAGNOSIS — Z9889 Other specified postprocedural states: Secondary | ICD-10-CM | POA: Diagnosis not present

## 2021-03-07 ENCOUNTER — Other Ambulatory Visit: Payer: Self-pay

## 2021-03-08 ENCOUNTER — Encounter: Payer: Self-pay | Admitting: Nurse Practitioner

## 2021-03-08 ENCOUNTER — Ambulatory Visit (INDEPENDENT_AMBULATORY_CARE_PROVIDER_SITE_OTHER): Payer: Medicare HMO | Admitting: Nurse Practitioner

## 2021-03-08 VITALS — BP 116/70 | HR 70 | Temp 97.0°F | Wt 192.6 lb

## 2021-03-08 DIAGNOSIS — I10 Essential (primary) hypertension: Secondary | ICD-10-CM

## 2021-03-08 DIAGNOSIS — Z23 Encounter for immunization: Secondary | ICD-10-CM

## 2021-03-08 DIAGNOSIS — E7800 Pure hypercholesterolemia, unspecified: Secondary | ICD-10-CM | POA: Diagnosis not present

## 2021-03-08 MED ORDER — LOSARTAN POTASSIUM 100 MG PO TABS: 100.0000 mg | ORAL_TABLET | Freq: Every day | ORAL | 3 refills | Status: DC

## 2021-03-08 MED ORDER — AMLODIPINE BESYLATE 10 MG PO TABS: 10.0000 mg | ORAL_TABLET | Freq: Every day | ORAL | 3 refills | Status: DC

## 2021-03-08 MED ORDER — PRAVASTATIN SODIUM 40 MG PO TABS: ORAL_TABLET | ORAL | 3 refills | Status: DC

## 2021-03-08 NOTE — Patient Instructions (Signed)
Please bring a copy od immunizations administered by [facility]. Ok to get TDAP vaccine today. Maintain current medications.

## 2021-03-08 NOTE — Assessment & Plan Note (Addendum)
BP at goal No LE edema BP Readings from Last 3 Encounters:  03/08/21 116/70  12/08/20 114/80  11/23/20 (!) 150/92  reviewed CMP completed by oncology Maintain amlodipine and losartan

## 2021-03-08 NOTE — Progress Notes (Signed)
Subjective:  Patient ID: Heidi [name], female    DOB: 1945-11-27  Age: 75 y.o. MRN: [id number]  CC: Follow-up (3 month f/u on HTN. /Pt checks BP at home and states it has been good. )  HPI  Essential hypertension BP at goal No LE edema BP Readings from Last 3 Encounters:  03/08/21 116/70  12/08/20 114/80  11/23/20 (!) 150/92  reviewed CMP completed by oncology Maintain amlodipine and losartan  HYPERCHOLESTEROLEMIA Maintain pravastatin dose. Repeat lipid panel in 42months  BP Readings from Last 3 Encounters:  03/08/21 116/70  12/08/20 114/80  11/23/20 (!) 150/92   Wt Readings from Last 3 Encounters:  03/08/21 192 lb 9.6 oz (87.4 kg)  12/08/20 184 lb 12.8 oz (83.8 kg)  11/23/20 186 lb (84.4 kg)   Reviewed past Medical, Social and Family history today.  Outpatient Medications Prior to Visit  Medication Sig Dispense Refill  . ALPHA LIPOIC ACID PO Take by mouth at bedtime.    . Ascorbic Acid (SM CHEWABLE VITAMIN C) 500 MG CHEW Chew 500 mg by mouth daily.    Marland Kitchen aspirin 81 MG tablet Take 81 mg by mouth at bedtime.    . Biotin 10 MG TABS Take by mouth daily.    . cetirizine (ZYRTEC) 5 MG tablet Take 1 tablet (5 mg total) by mouth at bedtime. (Patient taking differently: Take 5 mg by mouth daily.)    . cholecalciferol (VITAMIN D) 1000 units tablet Take 2 tablets daily. (Patient taking differently: 1,000 Units daily. Pt states she take medication every other day.) 30 tablet 3  . Coenzyme Q10 (COQ10) 100 MG CAPS Take by mouth at bedtime.    Marland Kitchen estradiol (ESTRACE) 0.1 MG/GM vaginal cream Place vaginally every other day.     . fluticasone (FLONASE) 50 MCG/ACT nasal spray Place 2 sprays into both nostrils daily. (Patient taking differently: Place 2 sprays into both nostrils daily as needed.) 16 g 3  . NON FORMULARY CBD Gummy 10 mg nighty    . amLODipine (NORVASC) 10 MG tablet Take 1 tablet (10 mg total) by mouth at bedtime. 90 tablet 3  . losartan (COZAAR) 100 MG tablet Take 1 tablet  (100 mg total) by mouth daily. 90 tablet 3  . pravastatin (PRAVACHOL) 40 MG tablet TAKE 1/2 TABLET (20MG  TOTAL) DAILY. Need office visit for additional refills 45 tablet 1   No facility-administered medications prior to visit.    ROS See HPI  Objective:  BP 116/70 (BP Location: Left Arm, Patient Position: Sitting, Cuff Size: Large)   Pulse 70   Temp (!) 97 F (36.1 C) (Temporal)   Wt 192 lb 9.6 oz (87.4 kg)   LMP 10/23/1994 (Approximate)   SpO2 98%   BMI 32.05 kg/m   Physical Exam Cardiovascular:     Rate and Rhythm: Normal rate and regular rhythm.     Pulses: Normal pulses.     Heart sounds: Normal heart sounds.  Pulmonary:     Effort: Pulmonary effort is normal.     Breath sounds: Normal breath sounds.  Musculoskeletal:     Right lower leg: No edema.     Left lower leg: No edema.  Neurological:     Mental Status: She is alert and oriented to person, place, and time.    Assessment & Plan:  This visit occurred during the SARS-CoV-2 public health emergency.  Safety protocols were in place, including screening questions prior to the visit, additional usage of staff PPE, and extensive cleaning of exam room  while observing appropriate contact time as indicated for disinfecting solutions.   Umaima was seen today for follow-up.  Diagnoses and all orders for this visit:  Essential hypertension -     amLODipine (NORVASC) 10 MG tablet; Take 1 tablet (10 mg total) by mouth at bedtime. -     losartan (COZAAR) 100 MG tablet; Take 1 tablet (100 mg total) by mouth daily.  HYPERCHOLESTEROLEMIA -     pravastatin (PRAVACHOL) 40 MG tablet; TAKE 1/2 TABLET (20MG  TOTAL) DAILY.  Need for diphtheria-tetanus-pertussis (Tdap) vaccine -     Tdap vaccine greater than or equal to 7yo IM    Problem List Items Addressed This Visit      Cardiovascular and Mediastinum   Essential hypertension - Primary    BP at goal No LE edema BP Readings from Last 3 Encounters:  03/08/21 116/70   12/08/20 114/80  11/23/20 (!) 150/92  reviewed CMP completed by oncology Maintain amlodipine and losartan      Relevant Medications   amLODipine (NORVASC) 10 MG tablet   losartan (COZAAR) 100 MG tablet   pravastatin (PRAVACHOL) 40 MG tablet     Other   HYPERCHOLESTEROLEMIA    Maintain pravastatin dose. Repeat lipid panel in 57months      Relevant Medications   amLODipine (NORVASC) 10 MG tablet   losartan (COZAAR) 100 MG tablet   pravastatin (PRAVACHOL) 40 MG tablet    Other Visit Diagnoses    Need for diphtheria-tetanus-pertussis (Tdap) vaccine       Relevant Orders   Tdap vaccine greater than or equal to 7yo IM (Completed)      Follow-up: Return in about 6 months (around 09/08/2021) for HTN and , hyperlipidemia (fasting).  Wilfred Lacy, NP

## 2021-03-08 NOTE — Assessment & Plan Note (Signed)
Maintain pravastatin dose. Repeat lipid panel in 67months

## 2021-04-06 ENCOUNTER — Ambulatory Visit: Payer: Medicare HMO | Admitting: Obstetrics and Gynecology

## 2021-04-07 NOTE — Progress Notes (Signed)
75 y.o. [id number] Divorced Black or [place] American Not Hispanic or Latino female here for breast & pelvic.  H/O hysterectomy.  Feels well. Walking 2 miles a day, doing strength training. Her boyfriend got her a pelaton bike for Christmas, she hasn't been using it yet.  Same long term partner x 14 years. Don't live together. Sexually active, no pain.  She is using vaginal estrogen, her Urologist is giving it to her.    H/O small bowel cancer. In 10/20 she had an exploratory laparotomy with SBR, BSO, appendectomy and cholecystectomy. Nodes were +. Negative CT in May, 2022. She goes back in 11/22.   H/O renal cell cancer. Has one kidney. Uses a probiotic which helps her bladder and bowels.   Intermittent constipation.   Has had some anxiety.   Patient's last menstrual period was 10/23/1994 (approximate).          Sexually active: Yes.    The current method of family planning is status post hysterectomy.    Exercising: Yes.     Walking & strength training Smoker:  no  Health Maintenance: Pap:  2009 normal History of abnormal Pap:  no MMG:  04-14-21 needs additional imaging BMD:   06-24-19 normal Colonoscopy: 2020 normal TDaP:  2021   reports that she quit smoking about 37 years ago. Her smoking use included cigarettes. She has a 2.50 pack-year smoking history. She has never used smokeless tobacco. She reports previous alcohol use. She reports that she does not use drugs. Just occasional ETOH.  Retired Government social research officer. 2 grown kids, live in [place]. 2 grandchildren: 77 and 22.   Past Medical History:  Diagnosis Date   Anxiety    Chronic rhinitis    DJD (degenerative joint disease)    GERD (gastroesophageal reflux disease)    History of headache    HTN (hypertension)    Hypercholesterolemia    IBS (irritable bowel syndrome)    Renal cell carcinoma 2001   Small bowel mass    Venous insufficiency     Past Surgical History:  Procedure Laterality Date   NEPHRECTOMY  2001   right  = for renal cell CA   small bowel mass removal     TOTAL ABDOMINAL HYSTERECTOMY     TUBAL LIGATION      Current Outpatient Medications  Medication Sig Dispense Refill   ALPHA LIPOIC ACID PO Take by mouth at bedtime.     amLODipine (NORVASC) 10 MG tablet Take 1 tablet (10 mg total) by mouth at bedtime. 90 tablet 3   Ascorbic Acid (SM CHEWABLE VITAMIN C) 500 MG CHEW Chew 500 mg by mouth daily.     aspirin 81 MG tablet Take 81 mg by mouth at bedtime.     Biotin 10 MG TABS Take by mouth daily.     cetirizine (ZYRTEC) 5 MG tablet Take 1 tablet (5 mg total) by mouth at bedtime. (Patient taking differently: Take 5 mg by mouth daily.)     cholecalciferol (VITAMIN D) 1000 units tablet Take 2 tablets daily. (Patient taking differently: 1,000 Units. Pt states she take medication every other day.) 30 tablet 3   Coenzyme Q10 (COQ10) 100 MG CAPS Take by mouth at bedtime.     estradiol (ESTRACE) 0.1 MG/GM vaginal cream Place vaginally every other day.      fluticasone (FLONASE) 50 MCG/ACT nasal spray Place 2 sprays into both nostrils daily. (Patient taking differently: Place 2 sprays into both nostrils daily as needed.) 16 g 3  losartan (COZAAR) 100 MG tablet Take 1 tablet (100 mg total) by mouth daily. 90 tablet 3   NON FORMULARY CBD Gummy 10 mg nighty     pravastatin (PRAVACHOL) 40 MG tablet TAKE 1/2 TABLET (20MG  TOTAL) DAILY. 45 tablet 3   Probiotic Product (PROBIOTIC PO)      No current facility-administered medications for this visit.    Family History  Problem Relation Age of Onset   Healthy Mother    Diabetes Mother    Hyperlipidemia Mother    Hypertension Mother    Prostate cancer Father    Cancer Father    Breast cancer Sister    Cancer - Other Brother        neck ? lymph nodes   Colon cancer Neg Hx    Pancreatic cancer Neg Hx    Stomach cancer Neg Hx     Review of Systems  Constitutional: Negative.   HENT: Negative.    Eyes: Negative.   Respiratory: Negative.     Cardiovascular: Negative.   Gastrointestinal: Negative.   Endocrine: Negative.   Genitourinary: Negative.   Musculoskeletal: Negative.   Skin: Negative.   Allergic/Immunologic: Negative.   Neurological: Negative.   Hematological: Negative.   Psychiatric/Behavioral: Negative.     Exam:   Ht 5' 5.5" (1.664 m)   Wt 189 lb (85.7 kg)   LMP 10/23/1994 (Approximate)   BMI 30.97 kg/m   Weight change: @WEIGHTCHANGE @ Height:   Height: 5' 5.5" (166.4 cm)  Ht Readings from Last 3 Encounters:  04/15/21 5' 5.5" (1.664 m)  12/08/20 5\' 5"  (1.651 m)  11/23/20 5\' 5"  (1.651 m)    General appearance: alert, cooperative and appears stated age Head: Normocephalic, without obvious abnormality, atraumatic Neck: no adenopathy, supple, symmetrical, trachea midline and thyroid normal to inspection and palpation Lungs: clear to auscultation bilaterally Cardiovascular: regular rate and rhythm Breasts: normal appearance, no masses or tenderness Abdomen: soft, non-tender; non distended,  no masses,  no organomegaly Extremities: extremities normal, atraumatic, no cyanosis or edema Skin: Skin color, texture, turgor normal. No rashes or lesions Lymph nodes: Cervical, supraclavicular, and axillary nodes normal. No abnormal inguinal nodes palpated Neurologic: Grossly normal   Pelvic: External genitalia:  no lesions              Urethra:  normal appearing urethra with no masses, tenderness or lesions              Bartholins and Skenes: normal                 Vagina: normal appearing vagina with normal color and discharge, no lesions              Cervix: absent               Bimanual Exam:  Uterus:  uterus absent              Adnexa: no mass, fullness, tenderness               Rectovaginal: Confirms               Anus:  normal sphincter tone, no lesions  Joy, CMA chaperoned for the exam.  1. Encounter for gynecological examination without abnormal finding No pap needed Mammogram just done, needs to  go back for additional images DEXA and colonoscopy UTD Discussed breast self awareness  Labs with primary

## 2021-04-14 ENCOUNTER — Ambulatory Visit
Admission: RE | Admit: 2021-04-14 | Discharge: 2021-04-14 | Disposition: A | Discharge status: Home / Self Care | Payer: Medicare HMO | Source: Ambulatory Visit

## 2021-04-14 ENCOUNTER — Other Ambulatory Visit: Payer: Self-pay

## 2021-04-14 DIAGNOSIS — Z1231 Encounter for screening mammogram for malignant neoplasm of breast: Secondary | ICD-10-CM

## 2021-04-15 ENCOUNTER — Encounter: Payer: Self-pay | Admitting: Obstetrics and Gynecology

## 2021-04-15 ENCOUNTER — Other Ambulatory Visit: Payer: Self-pay | Admitting: Nurse Practitioner

## 2021-04-15 ENCOUNTER — Ambulatory Visit (INDEPENDENT_AMBULATORY_CARE_PROVIDER_SITE_OTHER): Payer: Medicare HMO | Admitting: Obstetrics and Gynecology

## 2021-04-15 VITALS — Ht 65.5 in | Wt 189.0 lb

## 2021-04-15 DIAGNOSIS — Z01419 Encounter for gynecological examination (general) (routine) without abnormal findings: Secondary | ICD-10-CM | POA: Diagnosis not present

## 2021-04-15 DIAGNOSIS — R928 Other abnormal and inconclusive findings on diagnostic imaging of breast: Secondary | ICD-10-CM

## 2021-05-05 ENCOUNTER — Ambulatory Visit
Admission: RE | Admit: 2021-05-05 | Discharge: 2021-05-05 | Disposition: A | Discharge status: Home / Self Care | Payer: Medicare HMO | Source: Ambulatory Visit | Attending: Nurse Practitioner | Admitting: Nurse Practitioner

## 2021-05-05 ENCOUNTER — Other Ambulatory Visit: Payer: Self-pay

## 2021-05-05 ENCOUNTER — Other Ambulatory Visit: Payer: Self-pay | Admitting: Nurse Practitioner

## 2021-05-05 DIAGNOSIS — R928 Other abnormal and inconclusive findings on diagnostic imaging of breast: Secondary | ICD-10-CM

## 2021-05-05 DIAGNOSIS — N6321 Unspecified lump in the left breast, upper outer quadrant: Secondary | ICD-10-CM | POA: Diagnosis not present

## 2021-05-05 DIAGNOSIS — N6320 Unspecified lump in the left breast, unspecified quadrant: Secondary | ICD-10-CM

## 2021-05-05 DIAGNOSIS — Z170 Estrogen receptor positive status [ER+]: Secondary | ICD-10-CM | POA: Diagnosis not present

## 2021-05-05 DIAGNOSIS — C50412 Malignant neoplasm of upper-outer quadrant of left female breast: Secondary | ICD-10-CM | POA: Diagnosis not present

## 2021-05-05 DIAGNOSIS — R922 Inconclusive mammogram: Secondary | ICD-10-CM | POA: Diagnosis not present

## 2021-05-06 ENCOUNTER — Other Ambulatory Visit: Payer: Self-pay | Admitting: Family

## 2021-05-06 DIAGNOSIS — N6320 Unspecified lump in the left breast, unspecified quadrant: Secondary | ICD-10-CM

## 2021-05-06 NOTE — Addendum Note (Signed)
Addended by: Dutch Quint B on: 05/06/2021 01:04 PM   Modules accepted: Orders

## 2021-05-09 DIAGNOSIS — C50412 Malignant neoplasm of upper-outer quadrant of left female breast: Secondary | ICD-10-CM | POA: Diagnosis not present

## 2021-05-09 DIAGNOSIS — C50919 Malignant neoplasm of unspecified site of unspecified female breast: Secondary | ICD-10-CM | POA: Insufficient documentation

## 2021-05-23 HISTORY — PX: BREAST SURGERY: SHX581

## 2021-05-25 DIAGNOSIS — C641 Malignant neoplasm of right kidney, except renal pelvis: Secondary | ICD-10-CM | POA: Diagnosis not present

## 2021-05-25 DIAGNOSIS — Z85528 Personal history of other malignant neoplasm of kidney: Secondary | ICD-10-CM | POA: Insufficient documentation

## 2021-05-25 DIAGNOSIS — C649 Malignant neoplasm of unspecified kidney, except renal pelvis: Secondary | ICD-10-CM | POA: Insufficient documentation

## 2021-05-25 DIAGNOSIS — C50912 Malignant neoplasm of unspecified site of left female breast: Secondary | ICD-10-CM | POA: Diagnosis not present

## 2021-05-25 DIAGNOSIS — Z8042 Family history of malignant neoplasm of prostate: Secondary | ICD-10-CM | POA: Diagnosis not present

## 2021-05-25 DIAGNOSIS — C50212 Malignant neoplasm of upper-inner quadrant of left female breast: Secondary | ICD-10-CM | POA: Diagnosis not present

## 2021-05-25 DIAGNOSIS — C7A011 Malignant carcinoid tumor of the jejunum: Secondary | ICD-10-CM | POA: Diagnosis not present

## 2021-05-25 DIAGNOSIS — Z170 Estrogen receptor positive status [ER+]: Secondary | ICD-10-CM | POA: Diagnosis not present

## 2021-05-25 DIAGNOSIS — C50412 Malignant neoplasm of upper-outer quadrant of left female breast: Secondary | ICD-10-CM | POA: Diagnosis not present

## 2021-05-25 DIAGNOSIS — Z800 Family history of malignant neoplasm of digestive organs: Secondary | ICD-10-CM | POA: Diagnosis not present

## 2021-05-25 DIAGNOSIS — Z803 Family history of malignant neoplasm of breast: Secondary | ICD-10-CM | POA: Diagnosis not present

## 2021-05-30 ENCOUNTER — Telehealth: Payer: Self-pay

## 2021-05-30 NOTE — Progress Notes (Signed)
Spoke to patient to confirmed patient telephone appointment on 05/31/2021 for CCM at 9:00 am with Junius Argyle the Clinical pharmacist.   Patient Verbalized understanding and denies any side effects from any of her current medications or any concerns with her health.  [place] Pharmacist Assistant [phone]

## 2021-05-31 ENCOUNTER — Ambulatory Visit (INDEPENDENT_AMBULATORY_CARE_PROVIDER_SITE_OTHER): Payer: Medicare HMO

## 2021-05-31 DIAGNOSIS — Z170 Estrogen receptor positive status [ER+]: Secondary | ICD-10-CM

## 2021-05-31 DIAGNOSIS — C50212 Malignant neoplasm of upper-inner quadrant of left female breast: Secondary | ICD-10-CM

## 2021-05-31 DIAGNOSIS — E7800 Pure hypercholesterolemia, unspecified: Secondary | ICD-10-CM | POA: Diagnosis not present

## 2021-05-31 DIAGNOSIS — I10 Essential (primary) hypertension: Secondary | ICD-10-CM

## 2021-05-31 NOTE — Progress Notes (Signed)
Chronic Care Management Pharmacy Note  05/31/2021 Name:  Heidi [name] MRN:  [id number] DOB:  1946-04-11  Summary: Patient presents for CCM follow-up. Her primary concern today is her recent diagnosis of breast cancer. She is scheduled for surgery later in August.   Recommendations/Changes made from today's visit: Continue Current Medications  Plan: CPP follow-up in 6 months   Subjective: Heidi [name] is an 75 y.o. year old female who is a primary patient of Nche, Charlene Brooke, NP.  The CCM team was consulted for assistance with disease management and care coordination needs.    Engaged with patient by telephone for follow up visit in response to provider referral for pharmacy case management and/or care coordination services.   Consent to Services:  The patient was given information about Chronic Care Management services, agreed to services, and gave verbal consent prior to initiation of services.  Please see initial visit note for detailed documentation.   Patient Care Team: Nche, Charlene Brooke, NP as PCP - General (Internal Medicine) Germaine Pomfret, [facility] as Pharmacist (Pharmacist)  Recent office visits: 03/08/21: Patient presented to Wilfred Lacy, NP for follow-up. No medication changes made.  11/23/20: Patient presented to [facility] for follow-up. Glucose elevated (non-fasting). BP in clinic 150/92, patient started on amlodipine 10 mg daily.  10/06/20: Patient presented to [facility] for follow-up. BP in clinic 138/74. Losartan changed to 100 mg daily.   Recent consult visits: 05/25/21: Patient presented for oncology consult.  04/15/21: Patient presented to Dr. Talbert Nan (OBGYN) for follow-up. MRI revealed possible neoplasm in breast.   Objective:  Lab Results  Component Value Date   CREATININE 0.90 11/24/2020   BUN 12 11/24/2020   GFR 63.38 11/24/2020   GFRNONAA 53 (L) 11/27/2017   GFRAA 61 11/27/2017   NA 138 11/24/2020   K 4.0 11/24/2020   CALCIUM 10.0  11/24/2020   CO2 30 11/24/2020    Lab Results  Component Value Date/Time   HGBA1C 5.7 02/26/2020 08:34 AM   GFR 63.38 11/24/2020 10:44 AM   GFR 67.49 05/13/2019 10:55 AM    Last diabetic Eye exam: No results found for: HMDIABEYEEXA  Last diabetic Foot exam: No results found for: HMDIABFOOTEX   Lab Results  Component Value Date   CHOL 163 11/24/2020   HDL 55.30 11/24/2020   LDLCALC 93 11/24/2020   LDLDIRECT 149.6 07/08/2007   TRIG 72.0 11/24/2020   CHOLHDL 3 11/24/2020    Hepatic Function Latest Ref Rng & Units 12/09/2019 05/13/2019 11/06/2018  Total Protein 6.0 - 8.3 g/dL - 7.1 7.7  Albumin 3.5 - 5.0 4.4 4.3 4.6  AST 13 - 35 _0 ALT 7 - 35 10 13 47(H)  Alk Phosphatase 39 - 117 U/L - 63 61  Total Bilirubin 0.2 - 1.2 mg/dL - 0.7 0.6  Bilirubin, Direct 0.0 - 0.3 mg/dL - 0.1 -    Lab Results  Component Value Date/Time   TSH 1.49 11/24/2020 10:44 AM   TSH 1.92 07/21/2019 08:06 AM   FREET4 0.76 07/21/2019 08:06 AM    CBC Latest Ref Rng & Units 12/09/2019 11/27/2017 12/11/2016  WBC - 7.2 5.8 6.5  Hemoglobin 12.0 - 16.0 13.4 13.3 13.9  Hematocrit 36 - 46 41 40.2 41.8  Platelets 150 - 399 303 342.0 316.0    Lab Results  Component Value Date/Time   VD25OH 29.49 (L) 11/27/2017 11:43 AM   VD25OH 39.16 12/11/2016 10:54 AM    Clinical ASCVD: No  The 10-year ASCVD risk score (  Renaye Rakers., et al., 2013) is: 15%   Values used to calculate the score:     Age: 44 years     Sex: Female     Is Non-Hispanic African American: Yes     Diabetic: No     Tobacco smoker: No     Systolic Blood Pressure: 631 mmHg     Is BP treated: Yes     HDL Cholesterol: 55.3 mg/dL     Total Cholesterol: 163 mg/dL    Depression screen [facility] 2/9 11/23/2020 11/19/2019 11/13/2018  Decreased Interest 0 0 0  Down, Depressed, Hopeless 0 0 0  PHQ - 2 Score 0 0 0     Social History   Tobacco Use  Smoking Status Former   Packs/day: 0.25   Years: 10.00   Pack years: 2.50   Types: Cigarettes   Quit  date: 10/24/1983   Years since quitting: 37.6  Smokeless Tobacco Never   BP Readings from Last 3 Encounters:  03/08/21 116/70  12/08/20 114/80  11/23/20 (!) 150/92   Pulse Readings from Last 3 Encounters:  03/08/21 70  12/08/20 68  11/23/20 70   Wt Readings from Last 3 Encounters:  04/15/21 189 lb (85.7 kg)  03/08/21 192 lb 9.6 oz (87.4 kg)  12/08/20 184 lb 12.8 oz (83.8 kg)    Assessment/Interventions: Review of patient past medical history, allergies, medications, health status, including review of consultants reports, laboratory and other test data, was performed as part of comprehensive evaluation and provision of chronic care management services.   SDOH:  (Social Determinants of Health) assessments and interventions performed: Yes SDOH Interventions    Flowsheet Row Most Recent Value  SDOH Interventions   Financial Strain Interventions Intervention Not Indicated        CCM Care Plan  No Known Allergies  Medications Reviewed Today     Reviewed by Germaine Pomfret, [facility] (Pharmacist) on 05/31/21 at [place] List Status: <None>   Medication Order Taking? Sig Documenting Provider Last Dose Status Informant  ALPHA LIPOIC ACID PO [id number]  Take by mouth at bedtime. [provider]  Active   amLODipine (NORVASC) 10 MG tablet [id number]  Take 1 tablet (10 mg total) by mouth at bedtime. Nche, Charlene Brooke, NP  Active   Ascorbic Acid (SM CHEWABLE VITAMIN C) 500 MG CHEW [id number]  Chew 500 mg by mouth daily. [provider]  Active   aspirin 81 MG tablet [id number]  Take 81 mg by mouth at bedtime. [provider]  Active   Biotin 10 MG TABS [id number]  Take by mouth daily. [provider]  Active   cetirizine (ZYRTEC) 5 MG tablet [id number]  Take 1 tablet (5 mg total) by mouth at bedtime.  Patient taking differently: Take 5 mg by mouth daily.   Flossie Buffy, NP  Active   cholecalciferol (VITAMIN D) 1000 units tablet [id number]  Take 2  tablets daily.  Patient taking differently: 1,000 Units. Pt states she take medication every other day.   Noralee Space, MD  Active   Coenzyme Q10 (COQ10) 100 MG CAPS [id number]  Take by mouth at bedtime. [provider]  Active   fluticasone (FLONASE) 50 MCG/ACT nasal spray [id number]  Place 2 sprays into both nostrils daily.  Patient taking differently: Place 2 sprays into both nostrils daily as needed.   Flossie Buffy, NP  Active   losartan (COZAAR) 100 MG tablet [id number]  Take 1 tablet (100 mg total) by  mouth daily. Flossie Buffy, NP  Active   NON FORMULARY [id number]  CBD Gummy 10 mg nighty [provider]  Active   pravastatin (PRAVACHOL) 40 MG tablet [id number]  TAKE 1/2 TABLET (20MG TOTAL) DAILY. Flossie Buffy, NP  Active   Probiotic Product (PROBIOTIC PO) [id number] Yes 1 tablet. 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 ([place]) 08/26/2019   Subepithelial gastric mass 06/05/2019   Mesenteric mass 06/08/2016   Heel spur 04/19/2015   CHRONIC RHINITIS 01/12/2009   Malignant neoplasm of kidney excluding renal pelvis ([place]) 01/11/2008   ACUTE CYSTITIS 01/11/2008   Osteoarthritis 01/11/2008   HEADACHE 01/11/2008   HYPERCHOLESTEROLEMIA 01/02/2008   Essential hypertension 01/02/2008   Venous (peripheral) insufficiency 01/02/2008   IRRITABLE BOWEL SYNDROME 01/02/2008    Immunization History  Administered Date(s) Administered   Fluad Quad(high Dose 65+) 06/19/2019   Influenza Split 10/12/2011, 10/11/2012   Influenza Whole 10/14/2009, 10/12/2010   Influenza,inj,Quad PF,6+ Mos 08/27/2013, 10/14/2014, 11/24/2015, 10/02/2016   Influenza-Unspecified 08/23/2017, 07/23/2018, 06/24/2020   Moderna SARS-COV2 Booster Vaccination 01/22/2021   Moderna Sars-Covid-2 Vaccination 12/21/2019, 08/17/2020, 01/22/2021   Pneumococcal  Conjugate-13 12/11/2016   Pneumococcal Polysaccharide-23 04/11/2012   Td 11/23/2010   Tdap 03/08/2021    Conditions to be addressed/monitored:  Hypertension, Hyperlipidemia, GERD, Anxiety, Allergic Rhinitis and Irritable Bowel Syndrome and History of Carcinoma    Care Plan : General Pharmacy (Adult)  Updates made by Germaine Pomfret, RPH since 05/31/2021 12:00 AM     Problem: Hypertension, Hyperlipidemia, GERD, Anxiety, Allergic Rhinitis and Irritable Bowel Syndrome and History of Carcinoma   Priority: High     Long-Range Goal: Patient-Specific Goal   Start Date: 11/30/2020  Expected End Date: 05/30/2022  This Visit's Progress: On track  Recent Progress: On track  Priority: High  Note:   Current Barriers:  No barriers currently noted  Pharmacist Clinical Goal(s):  Over the next 90 days, patient will achieve control of Blood pressure as evidenced by BP < 140/90 through collaboration with PharmD and provider.   Interventions: 1:1 collaboration with Nche, Charlene Brooke, NP regarding development and update of comprehensive plan of care as evidenced by provider attestation and co-signature Inter-disciplinary care team collaboration (see longitudinal plan of care) Comprehensive medication review performed; medication list updated in electronic medical record  Hypertension (BP goal <140/90) -controlled -Current treatment: Amlodipine 10 mg at bedtime  Losartan 100 mg daily  -Medications previously tried: NA  -Current home readings: 125-139/84-95  -Current dietary habits: Trying to eat plenty of fruits and vegetables. Drinking plenty of water.  -Current exercise habits: Walking daily, exercise routines 3x daily  (low-impact, weights)  -Denies hypotensive/hypertensive symptoms -Reports some stomach aches since starting Amlodipine.  -Educated on Daily salt intake goal < 2300 mg; -Counseled to monitor BP at home weekly, document, and provide log at future appointments -Recommended to  continue current medication  Hyperlipidemia: (LDL goal < 100) -controlled -Current treatment: Pravastatin 40 mg 1/2 tablet daily  -Medications previously tried: NA  -Educated on Benefits of statin for ASCVD risk reduction; Importance of limiting foods high in cholesterol; -Recommended to continue current medication  Breast Cancer (Goal: Prolong Survival) -Controlled -Grade 2 ductal carcinoma, ER 100%, HER2 negative.  -Current treatment  None -Medications previously tried: NA  -Lumpectomy on august 22nd. -Patient instructed to discontinue estradiol cream, reports she was able to without  withdrawal side effects.  -Recommended to continue current medication   Patient Goals/Self-Care Activities Over the next 90 days, patient will:  - take medications as prescribed check blood pressure Weekly, document, and provide at future appointments  Follow Up Plan: Telephone follow up appointment with care management team member scheduled for:  12/01/21 at 10:00 AM       Medication Assistance: None required.  Patient affirms current coverage meets needs.  Patient's preferred pharmacy is:  [facility] [place], [place] [address] [place] [address] Phone: [phone] Fax: [place], [address] [place] [address] Phone: [phone] Fax: [phone]  Uses pill box? Yes Pt endorses 100% compliance  We discussed: Current pharmacy is preferred with insurance plan and patient is satisfied with pharmacy services Patient decided to: Continue current medication management strategy  Follow Up:  Patient agrees to Care Plan and Follow-up.  Plan: Telephone follow up appointment with care management team member scheduled for:  12/01/21 at 10:00 AM  [facility]  [phone]

## 2021-05-31 NOTE — Patient Instructions (Signed)
Visit Information It was great speaking with you today!  Please let me know if you have any questions about our visit.   Goals Addressed             This Visit's Progress    Track and Manage My Blood Pressure-Hypertension   On track    Timeframe:  Long-Range Goal Priority:  High Start Date:   11/30/2020                          Expected End Date:  05/31/2022                     Follow Up Date 09/21/2021    - check blood pressure daily    Why is this important?   You won't feel high blood pressure, but it can still hurt your blood vessels.  High blood pressure can cause heart or kidney problems. It can also cause a stroke.  Making lifestyle changes like losing a little weight or eating less salt will help.  Checking your blood pressure at home and at different times of the day can help to control blood pressure.  If the doctor prescribes medicine remember to take it the way the doctor ordered.  Call the office if you cannot afford the medicine or if there are questions about it.     Notes:         Patient Care Plan: General Pharmacy (Adult)     Problem Identified: Hypertension, Hyperlipidemia, GERD, Anxiety, Allergic Rhinitis and Irritable Bowel Syndrome and History of Carcinoma   Priority: High     Long-Range Goal: Patient-Specific Goal   Start Date: 11/30/2020  Expected End Date: 05/30/2022  This Visit's Progress: On track  Recent Progress: On track  Priority: High  Note:   Current Barriers:  No barriers currently noted  Pharmacist Clinical Goal(s):  Over the next 90 days, patient will achieve control of Blood pressure as evidenced by BP < 140/90 through collaboration with PharmD and provider.   Interventions: 1:1 collaboration with Nche, Charlene Brooke, NP regarding development and update of comprehensive plan of care as evidenced by provider attestation and co-signature Inter-disciplinary care team collaboration (see longitudinal plan of care) Comprehensive  medication review performed; medication list updated in electronic medical record  Hypertension (BP goal <140/90) -controlled -Current treatment: Amlodipine 10 mg at bedtime  Losartan 100 mg daily  -Medications previously tried: NA  -Current home readings: 125-139/84-95  -Current dietary habits: Trying to eat plenty of fruits and vegetables. Drinking plenty of water.  -Current exercise habits: Walking daily, exercise routines 3x daily  (low-impact, weights)  -Denies hypotensive/hypertensive symptoms -Reports some stomach aches since starting Amlodipine.  -Educated on Daily salt intake goal < 2300 mg; -Counseled to monitor BP at home weekly, document, and provide log at future appointments -Recommended to continue current medication  Hyperlipidemia: (LDL goal < 100) -controlled -Current treatment: Pravastatin 40 mg 1/2 tablet daily  -Medications previously tried: NA  -Educated on Benefits of statin for ASCVD risk reduction; Importance of limiting foods high in cholesterol; -Recommended to continue current medication  Breast Cancer (Goal: Prolong Survival) -Controlled -Grade 2 ductal carcinoma, ER 100%, HER2 negative.  -Current treatment  None -Medications previously tried: NA  -Lumpectomy on august 22nd. -Patient instructed to discontinue estradiol cream, reports she was able to without withdrawal side effects.  -Recommended to continue current medication   Patient Goals/Self-Care Activities Over the next 90 days, patient will:  -  take medications as prescribed check blood pressure Weekly, document, and provide at future appointments  Follow Up Plan: Telephone follow up appointment with care management team member scheduled for:  12/01/21 at 10:00 AM      Patient agreed to services and verbal consent obtained.   Patient verbalizes understanding of instructions provided today and agrees to view in [place].   Junius Argyle, PharmD, [place], CPP Clinical Pharmacist [place]  [facility] at [facility]  [phone]

## 2021-06-07 DIAGNOSIS — Z170 Estrogen receptor positive status [ER+]: Secondary | ICD-10-CM | POA: Diagnosis not present

## 2021-06-07 DIAGNOSIS — C50212 Malignant neoplasm of upper-inner quadrant of left female breast: Secondary | ICD-10-CM | POA: Diagnosis not present

## 2021-06-13 ENCOUNTER — Other Ambulatory Visit: Payer: Self-pay | Admitting: Nurse Practitioner

## 2021-06-13 ENCOUNTER — Encounter: Payer: Self-pay | Admitting: Nurse Practitioner

## 2021-06-13 DIAGNOSIS — C50412 Malignant neoplasm of upper-outer quadrant of left female breast: Secondary | ICD-10-CM

## 2021-06-13 DIAGNOSIS — Z170 Estrogen receptor positive status [ER+]: Secondary | ICD-10-CM | POA: Diagnosis not present

## 2021-06-13 DIAGNOSIS — C641 Malignant neoplasm of right kidney, except renal pelvis: Secondary | ICD-10-CM

## 2021-06-13 DIAGNOSIS — C7A8 Other malignant neuroendocrine tumors: Secondary | ICD-10-CM

## 2021-06-13 DIAGNOSIS — C50012 Malignant neoplasm of nipple and areola, left female breast: Secondary | ICD-10-CM | POA: Diagnosis not present

## 2021-06-13 DIAGNOSIS — C50212 Malignant neoplasm of upper-inner quadrant of left female breast: Secondary | ICD-10-CM | POA: Diagnosis not present

## 2021-06-13 NOTE — Assessment & Plan Note (Signed)
Left, IDC grade 2, ER 100%, PR 0%, and HER2 neg by FISH. Stage 1A(cT1N0). Diagnosed 04/2021 by Dr. Dwyane Dee with [place] s/p lumpectomy Genetic test also completed

## 2021-06-13 NOTE — Assessment & Plan Note (Signed)
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 Radical nephrectomy on 08/28/2000: 4x3.7x1.8cm, confined to kidney, pT1N0Mx

## 2021-07-01 DIAGNOSIS — C50919 Malignant neoplasm of unspecified site of unspecified female breast: Secondary | ICD-10-CM | POA: Diagnosis not present

## 2021-07-01 DIAGNOSIS — Z170 Estrogen receptor positive status [ER+]: Secondary | ICD-10-CM | POA: Diagnosis not present

## 2021-07-01 DIAGNOSIS — Z853 Personal history of malignant neoplasm of breast: Secondary | ICD-10-CM | POA: Diagnosis not present

## 2021-07-01 DIAGNOSIS — Z79899 Other long term (current) drug therapy: Secondary | ICD-10-CM | POA: Diagnosis not present

## 2021-07-12 DIAGNOSIS — C772 Secondary and unspecified malignant neoplasm of intra-abdominal lymph nodes: Secondary | ICD-10-CM | POA: Diagnosis not present

## 2021-07-12 DIAGNOSIS — C50112 Malignant neoplasm of central portion of left female breast: Secondary | ICD-10-CM | POA: Diagnosis not present

## 2021-07-12 DIAGNOSIS — Z170 Estrogen receptor positive status [ER+]: Secondary | ICD-10-CM | POA: Diagnosis not present

## 2021-07-12 DIAGNOSIS — Z9889 Other specified postprocedural states: Secondary | ICD-10-CM | POA: Diagnosis not present

## 2021-07-13 DIAGNOSIS — Z170 Estrogen receptor positive status [ER+]: Secondary | ICD-10-CM | POA: Diagnosis not present

## 2021-07-13 DIAGNOSIS — C50919 Malignant neoplasm of unspecified site of unspecified female breast: Secondary | ICD-10-CM | POA: Diagnosis not present

## 2021-07-25 DIAGNOSIS — Z510 Encounter for antineoplastic radiation therapy: Secondary | ICD-10-CM | POA: Diagnosis not present

## 2021-07-25 DIAGNOSIS — Z170 Estrogen receptor positive status [ER+]: Secondary | ICD-10-CM | POA: Diagnosis not present

## 2021-07-25 DIAGNOSIS — C50412 Malignant neoplasm of upper-outer quadrant of left female breast: Secondary | ICD-10-CM | POA: Diagnosis not present

## 2021-07-26 DIAGNOSIS — C50112 Malignant neoplasm of central portion of left female breast: Secondary | ICD-10-CM | POA: Diagnosis not present

## 2021-07-26 DIAGNOSIS — Z170 Estrogen receptor positive status [ER+]: Secondary | ICD-10-CM | POA: Diagnosis not present

## 2021-07-26 DIAGNOSIS — C50912 Malignant neoplasm of unspecified site of left female breast: Secondary | ICD-10-CM | POA: Diagnosis not present

## 2021-07-27 DIAGNOSIS — Z510 Encounter for antineoplastic radiation therapy: Secondary | ICD-10-CM | POA: Diagnosis not present

## 2021-07-27 DIAGNOSIS — C50412 Malignant neoplasm of upper-outer quadrant of left female breast: Secondary | ICD-10-CM | POA: Diagnosis not present

## 2021-07-27 DIAGNOSIS — Z170 Estrogen receptor positive status [ER+]: Secondary | ICD-10-CM | POA: Diagnosis not present

## 2021-08-02 DIAGNOSIS — Z170 Estrogen receptor positive status [ER+]: Secondary | ICD-10-CM | POA: Diagnosis not present

## 2021-08-02 DIAGNOSIS — C50412 Malignant neoplasm of upper-outer quadrant of left female breast: Secondary | ICD-10-CM | POA: Diagnosis not present

## 2021-08-02 DIAGNOSIS — Z510 Encounter for antineoplastic radiation therapy: Secondary | ICD-10-CM | POA: Diagnosis not present

## 2021-08-03 DIAGNOSIS — Z510 Encounter for antineoplastic radiation therapy: Secondary | ICD-10-CM | POA: Diagnosis not present

## 2021-08-03 DIAGNOSIS — C50412 Malignant neoplasm of upper-outer quadrant of left female breast: Secondary | ICD-10-CM | POA: Diagnosis not present

## 2021-08-03 DIAGNOSIS — Z170 Estrogen receptor positive status [ER+]: Secondary | ICD-10-CM | POA: Diagnosis not present

## 2021-08-04 DIAGNOSIS — Z510 Encounter for antineoplastic radiation therapy: Secondary | ICD-10-CM | POA: Diagnosis not present

## 2021-08-04 DIAGNOSIS — C50412 Malignant neoplasm of upper-outer quadrant of left female breast: Secondary | ICD-10-CM | POA: Diagnosis not present

## 2021-08-04 DIAGNOSIS — Z170 Estrogen receptor positive status [ER+]: Secondary | ICD-10-CM | POA: Diagnosis not present

## 2021-08-05 DIAGNOSIS — Z170 Estrogen receptor positive status [ER+]: Secondary | ICD-10-CM | POA: Diagnosis not present

## 2021-08-05 DIAGNOSIS — C50412 Malignant neoplasm of upper-outer quadrant of left female breast: Secondary | ICD-10-CM | POA: Diagnosis not present

## 2021-08-05 DIAGNOSIS — Z510 Encounter for antineoplastic radiation therapy: Secondary | ICD-10-CM | POA: Diagnosis not present

## 2021-08-08 DIAGNOSIS — Z510 Encounter for antineoplastic radiation therapy: Secondary | ICD-10-CM | POA: Diagnosis not present

## 2021-08-08 DIAGNOSIS — Z170 Estrogen receptor positive status [ER+]: Secondary | ICD-10-CM | POA: Diagnosis not present

## 2021-08-08 DIAGNOSIS — C50412 Malignant neoplasm of upper-outer quadrant of left female breast: Secondary | ICD-10-CM | POA: Diagnosis not present

## 2021-08-09 ENCOUNTER — Encounter: Payer: Self-pay | Admitting: Nurse Practitioner

## 2021-08-10 DIAGNOSIS — Z170 Estrogen receptor positive status [ER+]: Secondary | ICD-10-CM | POA: Diagnosis not present

## 2021-08-10 DIAGNOSIS — C50412 Malignant neoplasm of upper-outer quadrant of left female breast: Secondary | ICD-10-CM | POA: Diagnosis not present

## 2021-08-10 DIAGNOSIS — Z510 Encounter for antineoplastic radiation therapy: Secondary | ICD-10-CM | POA: Diagnosis not present

## 2021-08-11 DIAGNOSIS — C50412 Malignant neoplasm of upper-outer quadrant of left female breast: Secondary | ICD-10-CM | POA: Diagnosis not present

## 2021-08-11 DIAGNOSIS — Z510 Encounter for antineoplastic radiation therapy: Secondary | ICD-10-CM | POA: Diagnosis not present

## 2021-08-11 DIAGNOSIS — Z170 Estrogen receptor positive status [ER+]: Secondary | ICD-10-CM | POA: Diagnosis not present

## 2021-08-12 ENCOUNTER — Telehealth: Payer: Self-pay

## 2021-08-12 DIAGNOSIS — Z510 Encounter for antineoplastic radiation therapy: Secondary | ICD-10-CM | POA: Diagnosis not present

## 2021-08-12 DIAGNOSIS — C50412 Malignant neoplasm of upper-outer quadrant of left female breast: Secondary | ICD-10-CM | POA: Diagnosis not present

## 2021-08-12 DIAGNOSIS — Z170 Estrogen receptor positive status [ER+]: Secondary | ICD-10-CM | POA: Diagnosis not present

## 2021-08-12 NOTE — Progress Notes (Signed)
Chronic Care Management Pharmacy Assistant   Name: Orlean Holtrop  MRN: [id number] DOB: [birth year]  Reason for Encounter:Hypertension Disease State Call.  Recent office visits:  No recent Office Visit  Recent consult visits:  07/26/2021 Josie Dixon MD (Hematology and Gerlene Fee) Unable to see note 07/12/2021 Dr. Ranelle Oyster MD (Radiation Oncology) start triamcinolone (KENALOG) 0.1 % cream 07/01/2021 Marissa Howard-McNatt MD (General Surgery) Unable to see note 06/13/2021 Marissa Howard-McNatt MD (General Surgery) Unable to see note  Hospital visits:  None in previous 6 months  Medications: Outpatient Encounter Medications as of 08/12/2021  Medication Sig   ALPHA LIPOIC ACID PO Take by mouth at bedtime.   amLODipine (NORVASC) 10 MG tablet Take 1 tablet (10 mg total) by mouth at bedtime.   Ascorbic Acid (SM CHEWABLE VITAMIN C) 500 MG CHEW Chew 500 mg by mouth daily.   aspirin 81 MG tablet Take 81 mg by mouth at bedtime.   Biotin 10 MG TABS Take by mouth daily.   cetirizine (ZYRTEC) 5 MG tablet Take 1 tablet (5 mg total) by mouth at bedtime.   cholecalciferol (VITAMIN D) 1000 units tablet Take 2 tablets daily. (Patient taking differently: Take 1,000 Units by mouth every other day.)   Coenzyme Q10 (COQ10) 100 MG CAPS Take by mouth at bedtime.   fluticasone (FLONASE) 50 MCG/ACT nasal spray Place 2 sprays into both nostrils daily. (Patient taking differently: Place 2 sprays into both nostrils daily as needed.)   losartan (COZAAR) 100 MG tablet Take 1 tablet (100 mg total) by mouth daily.   NON FORMULARY CBD Gummy 10 mg nighty   pravastatin (PRAVACHOL) 40 MG tablet TAKE 1/2 TABLET (20MG  TOTAL) DAILY.   Probiotic Product (PROBIOTIC PO) 1 tablet.   No facility-administered encounter medications on file as of 08/12/2021.    Care Gaps: Pneumonia Vaccine   Star Rating Drugs: Losartan 100 mg last filled 06/16/2021 for 90 day supply at [facility]. Pravastatin 40 mg last filled  06/16/2021 for 90 day supply at [facility].  Medication Fill Gaps: None  Reviewed chart prior to disease state call. Spoke with patient regarding BP  Recent Office Vitals: BP Readings from Last 3 Encounters:  03/08/21 116/70  12/08/20 114/80  11/23/20 (!) 150/92   Pulse Readings from Last 3 Encounters:  03/08/21 70  12/08/20 68  11/23/20 70    Wt Readings from Last 3 Encounters:  04/15/21 189 lb (85.7 kg)  03/08/21 192 lb 9.6 oz (87.4 kg)  12/08/20 184 lb 12.8 oz (83.8 kg)     Kidney Function Lab Results  Component Value Date/Time   CREATININE 0.90 11/24/2020 10:44 AM   CREATININE 0.9 12/09/2019 12:00 AM   CREATININE 0.98 05/13/2019 10:55 AM   CREATININE 1.07 (H) 11/27/2017 11:43 AM   GFR 63.38 11/24/2020 10:44 AM   GFRNONAA 53 (L) 11/27/2017 11:43 AM   GFRAA 61 11/27/2017 11:43 AM    BMP Latest Ref Rng & Units 11/24/2020 12/09/2019 05/13/2019  Glucose 70 - 99 mg/dL 146(H) - 96  BUN 6 - 23 mg/dL 12 12 11   Creatinine 0.40 - 1.20 mg/dL 0.90 0.9 0.98  BUN/Creat Ratio 6 - 22 (calc) - - -  Sodium 135 - 145 mEq/L 138 138 139  Potassium 3.5 - 5.1 mEq/L 4.0 3.9 4.3  Chloride 96 - 112 mEq/L 100 - 105  CO2 19 - 32 mEq/L 30 - 26  Calcium 8.4 - 10.5 mg/dL 10.0 10.0 9.5    Current antihypertensive regimen:  Amlodipine 10 mg at bedtime  Losartan 100 mg daily   Patient denies lightheadedness,dizziness or headaches. Patient states her symptoms of stomach aches has improved since she spoken with the clinical pharmacist. How often are you checking your Blood Pressure? weekly/Patient reports not checking her blood pressure at home because she goes to the cancer center and they will check her blood pressure weekly. Current home BP readings:  Patient states her blood pressure is ranging around 127-133/60-70. What recent interventions/DTPs have been made by any provider to improve Blood Pressure control since last CPP Visit: None Any recent hospitalizations or ED visits since  last visit with CPP? No What diet changes have been made to improve Blood Pressure Control?  Patient denies any changes with her diet. What exercise is being done to improve your Blood Pressure Control?  Patient reports walking daily.   Adherence Review: Is the patient currently on ACE/ARB medication? Yes Does the patient have >5 day gap between last estimated fill dates? No  Telephone follow up appointment with Care management team member scheduled for : 12/01/2021 at 10:00 am.  [place] Pharmacist Assistant [phone]

## 2021-08-15 DIAGNOSIS — Z510 Encounter for antineoplastic radiation therapy: Secondary | ICD-10-CM | POA: Diagnosis not present

## 2021-08-15 DIAGNOSIS — C50412 Malignant neoplasm of upper-outer quadrant of left female breast: Secondary | ICD-10-CM | POA: Diagnosis not present

## 2021-08-15 DIAGNOSIS — Z170 Estrogen receptor positive status [ER+]: Secondary | ICD-10-CM | POA: Diagnosis not present

## 2021-08-16 DIAGNOSIS — Z170 Estrogen receptor positive status [ER+]: Secondary | ICD-10-CM | POA: Diagnosis not present

## 2021-08-16 DIAGNOSIS — Z510 Encounter for antineoplastic radiation therapy: Secondary | ICD-10-CM | POA: Diagnosis not present

## 2021-08-16 DIAGNOSIS — C50412 Malignant neoplasm of upper-outer quadrant of left female breast: Secondary | ICD-10-CM | POA: Diagnosis not present

## 2021-08-17 DIAGNOSIS — Z170 Estrogen receptor positive status [ER+]: Secondary | ICD-10-CM | POA: Diagnosis not present

## 2021-08-17 DIAGNOSIS — C50412 Malignant neoplasm of upper-outer quadrant of left female breast: Secondary | ICD-10-CM | POA: Diagnosis not present

## 2021-08-17 DIAGNOSIS — Z510 Encounter for antineoplastic radiation therapy: Secondary | ICD-10-CM | POA: Diagnosis not present

## 2021-08-18 DIAGNOSIS — C50412 Malignant neoplasm of upper-outer quadrant of left female breast: Secondary | ICD-10-CM | POA: Diagnosis not present

## 2021-08-18 DIAGNOSIS — Z170 Estrogen receptor positive status [ER+]: Secondary | ICD-10-CM | POA: Diagnosis not present

## 2021-08-18 DIAGNOSIS — Z510 Encounter for antineoplastic radiation therapy: Secondary | ICD-10-CM | POA: Diagnosis not present

## 2021-08-19 DIAGNOSIS — C50412 Malignant neoplasm of upper-outer quadrant of left female breast: Secondary | ICD-10-CM | POA: Diagnosis not present

## 2021-08-19 DIAGNOSIS — Z170 Estrogen receptor positive status [ER+]: Secondary | ICD-10-CM | POA: Diagnosis not present

## 2021-08-19 DIAGNOSIS — Z510 Encounter for antineoplastic radiation therapy: Secondary | ICD-10-CM | POA: Diagnosis not present

## 2021-08-22 DIAGNOSIS — Z510 Encounter for antineoplastic radiation therapy: Secondary | ICD-10-CM | POA: Diagnosis not present

## 2021-08-22 DIAGNOSIS — C50412 Malignant neoplasm of upper-outer quadrant of left female breast: Secondary | ICD-10-CM | POA: Diagnosis not present

## 2021-08-22 DIAGNOSIS — Z170 Estrogen receptor positive status [ER+]: Secondary | ICD-10-CM | POA: Diagnosis not present

## 2021-08-23 DIAGNOSIS — C50412 Malignant neoplasm of upper-outer quadrant of left female breast: Secondary | ICD-10-CM | POA: Diagnosis not present

## 2021-08-23 DIAGNOSIS — Z170 Estrogen receptor positive status [ER+]: Secondary | ICD-10-CM | POA: Diagnosis not present

## 2021-08-23 DIAGNOSIS — Z510 Encounter for antineoplastic radiation therapy: Secondary | ICD-10-CM | POA: Diagnosis not present

## 2021-08-24 DIAGNOSIS — Z510 Encounter for antineoplastic radiation therapy: Secondary | ICD-10-CM | POA: Diagnosis not present

## 2021-08-24 DIAGNOSIS — C50412 Malignant neoplasm of upper-outer quadrant of left female breast: Secondary | ICD-10-CM | POA: Diagnosis not present

## 2021-08-24 DIAGNOSIS — Z170 Estrogen receptor positive status [ER+]: Secondary | ICD-10-CM | POA: Diagnosis not present

## 2021-08-25 DIAGNOSIS — Z170 Estrogen receptor positive status [ER+]: Secondary | ICD-10-CM | POA: Diagnosis not present

## 2021-08-25 DIAGNOSIS — Z510 Encounter for antineoplastic radiation therapy: Secondary | ICD-10-CM | POA: Diagnosis not present

## 2021-08-25 DIAGNOSIS — C50412 Malignant neoplasm of upper-outer quadrant of left female breast: Secondary | ICD-10-CM | POA: Diagnosis not present

## 2021-08-29 ENCOUNTER — Telehealth: Payer: Self-pay | Admitting: Nurse Practitioner

## 2021-08-29 DIAGNOSIS — C7B8 Other secondary neuroendocrine tumors: Secondary | ICD-10-CM | POA: Diagnosis not present

## 2021-08-29 DIAGNOSIS — Z139 Encounter for screening, unspecified: Secondary | ICD-10-CM

## 2021-08-29 DIAGNOSIS — R748 Abnormal levels of other serum enzymes: Secondary | ICD-10-CM | POA: Diagnosis not present

## 2021-08-29 DIAGNOSIS — Z923 Personal history of irradiation: Secondary | ICD-10-CM | POA: Diagnosis not present

## 2021-08-29 DIAGNOSIS — C7A8 Other malignant neuroendocrine tumors: Secondary | ICD-10-CM | POA: Diagnosis not present

## 2021-08-29 NOTE — Telephone Encounter (Signed)
Pt is wanting a Bone density order placed at [facility]. Please advise pt at [phone].

## 2021-08-31 ENCOUNTER — Other Ambulatory Visit: Payer: Self-pay | Admitting: Nurse Practitioner

## 2021-08-31 NOTE — Telephone Encounter (Signed)
Pt calling again to check status of order for Bone Density. [facility] will not schedule w/o order.

## 2021-09-01 NOTE — Telephone Encounter (Signed)
Referral has been placed and patient notified. 

## 2021-09-12 ENCOUNTER — Telehealth: Payer: Self-pay | Admitting: Nurse Practitioner

## 2021-09-12 ENCOUNTER — Ambulatory Visit: Payer: Medicare HMO | Admitting: Nurse Practitioner

## 2021-09-12 DIAGNOSIS — Z780 Asymptomatic menopausal state: Secondary | ICD-10-CM

## 2021-09-12 NOTE — Telephone Encounter (Signed)
-----   Message from [facility] sent at 09/12/2021 11:14 AM EST ----- Regarding: bone density diagnosis Hello, this is not a valid diagnosis for bone density. The patient is scheduled since we are so far out, but could you please update the diagnosis on the order, thank you!

## 2021-09-27 ENCOUNTER — Telehealth: Payer: Self-pay

## 2021-09-27 DIAGNOSIS — Z923 Personal history of irradiation: Secondary | ICD-10-CM | POA: Diagnosis not present

## 2021-09-27 DIAGNOSIS — C50112 Malignant neoplasm of central portion of left female breast: Secondary | ICD-10-CM | POA: Diagnosis not present

## 2021-09-27 DIAGNOSIS — Z9889 Other specified postprocedural states: Secondary | ICD-10-CM | POA: Diagnosis not present

## 2021-09-27 DIAGNOSIS — Z170 Estrogen receptor positive status [ER+]: Secondary | ICD-10-CM | POA: Diagnosis not present

## 2021-09-27 NOTE — Progress Notes (Signed)
Error

## 2021-09-27 NOTE — Progress Notes (Signed)
Chronic Care Management Pharmacy Assistant   Name: Heidi [name]  MRN: [id number] DOB: [birth year]  Reason for Encounter:Hypertension Disease State Call.   Recent office visits:  08/29/2021 Wilfred Lacy NP (PCP) Place order for Bone Density scan  Recent consult visits:  08/29/2021 Dr. Dorene Grebe MD (Hematology and Oncology)No medication Changes noted, Follow up in 1 month.  Hospital visits:  None in previous 6 months  Medications: Outpatient Encounter Medications as of 09/27/2021  Medication Sig   ALPHA LIPOIC ACID PO Take by mouth at bedtime.   amLODipine (NORVASC) 10 MG tablet Take 1 tablet (10 mg total) by mouth at bedtime.   Ascorbic Acid (SM CHEWABLE VITAMIN C) 500 MG CHEW Chew 500 mg by mouth daily.   aspirin 81 MG tablet Take 81 mg by mouth at bedtime.   Biotin 10 MG TABS Take by mouth daily.   cetirizine (ZYRTEC) 5 MG tablet Take 1 tablet (5 mg total) by mouth at bedtime.   cholecalciferol (VITAMIN D) 1000 units tablet Take 2 tablets daily. (Patient taking differently: Take 1,000 Units by mouth every other day.)   Coenzyme Q10 (COQ10) 100 MG CAPS Take by mouth at bedtime.   fluticasone (FLONASE) 50 MCG/ACT nasal spray Place 2 sprays into both nostrils daily. (Patient taking differently: Place 2 sprays into both nostrils daily as needed.)   losartan (COZAAR) 100 MG tablet Take 1 tablet (100 mg total) by mouth daily.   NON FORMULARY CBD Gummy 10 mg nighty   pravastatin (PRAVACHOL) 40 MG tablet TAKE 1/2 TABLET (20MG  TOTAL) DAILY.   Probiotic Product (PROBIOTIC PO) 1 tablet.   No facility-administered encounter medications on file as of 09/27/2021.    Care Gaps: Pneumonia Vaccine  Shingrix Vaccine   Star Rating Drugs: Losartan 100 mg last filled 09/13/2021 for 90 day supply at [facility]. Pravastatin 40 mg last filled 09/13/2021 for 90 day supply at [facility].   Medication Fill Gaps: None   Reviewed chart prior to disease state call. Spoke with  patient regarding BP  Recent Office Vitals: BP Readings from Last 3 Encounters:  03/08/21 116/70  12/08/20 114/80  11/23/20 (!) 150/92   Pulse Readings from Last 3 Encounters:  03/08/21 70  12/08/20 68  11/23/20 70    Wt Readings from Last 3 Encounters:  04/15/21 189 lb (85.7 kg)  03/08/21 192 lb 9.6 oz (87.4 kg)  12/08/20 184 lb 12.8 oz (83.8 kg)     Kidney Function Lab Results  Component Value Date/Time   CREATININE 0.90 11/24/2020 10:44 AM   CREATININE 0.9 12/09/2019 12:00 AM   CREATININE 0.98 05/13/2019 10:55 AM   CREATININE 1.07 (H) 11/27/2017 11:43 AM   GFR 63.38 11/24/2020 10:44 AM   GFRNONAA 53 (L) 11/27/2017 11:43 AM   GFRAA 61 11/27/2017 11:43 AM    BMP Latest Ref Rng & Units 11/24/2020 12/09/2019 05/13/2019  Glucose 70 - 99 mg/dL 146(H) - 96  BUN 6 - 23 mg/dL 12 12 11   Creatinine 0.40 - 1.20 mg/dL 0.90 0.9 0.98  BUN/Creat Ratio 6 - 22 (calc) - - -  Sodium 135 - 145 mEq/L 138 138 139  Potassium 3.5 - 5.1 mEq/L 4.0 3.9 4.3  Chloride 96 - 112 mEq/L 100 - 105  CO2 19 - 32 mEq/L 30 - 26  Calcium 8.4 - 10.5 mg/dL 10.0 10.0 9.5    Current antihypertensive regimen:  Amlodipine 10 mg at bedtime  Losartan 100 mg daily   Patient denies lightheadedness,dizziness or headaches.  How often  are you checking your Blood Pressure? 3-5x per week Current home BP readings:  Patient states her blood pressure is ranging around 120-133/70-82. What recent interventions/DTPs have been made by any provider to improve Blood Pressure control since last CPP Visit: None Any recent hospitalizations or ED visits since last visit with CPP? No What diet changes have been made to improve Blood Pressure Control?  Patient denies any changes with her diet. What exercise is being done to improve your Blood Pressure Control?  Patient reports walking daily.   Adherence Review: Is the patient currently on ACE/ARB medication? Yes Does the patient have >5 day gap between last estimated fill  dates? No  Telephone follow up appointment with Care management team member scheduled for : 12/01/2021 at 10:00 am.  Telephone Appointment reschedule  to 12/06/2020 at 8:30 am for a follow up with the clinical pharmacist.   Anderson Malta Clinical Pharmacist Assistant [phone]

## 2021-10-03 DIAGNOSIS — Z853 Personal history of malignant neoplasm of breast: Secondary | ICD-10-CM | POA: Diagnosis not present

## 2021-10-03 DIAGNOSIS — Z905 Acquired absence of kidney: Secondary | ICD-10-CM | POA: Diagnosis not present

## 2021-10-03 DIAGNOSIS — C7B8 Other secondary neuroendocrine tumors: Secondary | ICD-10-CM | POA: Diagnosis not present

## 2021-10-03 DIAGNOSIS — Z923 Personal history of irradiation: Secondary | ICD-10-CM | POA: Diagnosis not present

## 2021-10-03 DIAGNOSIS — C772 Secondary and unspecified malignant neoplasm of intra-abdominal lymph nodes: Secondary | ICD-10-CM | POA: Diagnosis not present

## 2021-10-03 DIAGNOSIS — C7A8 Other malignant neuroendocrine tumors: Secondary | ICD-10-CM | POA: Diagnosis not present

## 2021-10-04 DIAGNOSIS — D0512 Intraductal carcinoma in situ of left breast: Secondary | ICD-10-CM | POA: Diagnosis not present

## 2021-10-04 DIAGNOSIS — C50112 Malignant neoplasm of central portion of left female breast: Secondary | ICD-10-CM | POA: Diagnosis not present

## 2021-10-04 DIAGNOSIS — N390 Urinary tract infection, site not specified: Secondary | ICD-10-CM | POA: Diagnosis not present

## 2021-10-04 DIAGNOSIS — C7B8 Other secondary neuroendocrine tumors: Secondary | ICD-10-CM | POA: Diagnosis not present

## 2021-10-04 DIAGNOSIS — Z79899 Other long term (current) drug therapy: Secondary | ICD-10-CM | POA: Diagnosis not present

## 2021-10-04 DIAGNOSIS — Z79811 Long term (current) use of aromatase inhibitors: Secondary | ICD-10-CM | POA: Diagnosis not present

## 2021-10-04 DIAGNOSIS — C7A8 Other malignant neuroendocrine tumors: Secondary | ICD-10-CM | POA: Diagnosis not present

## 2021-10-04 DIAGNOSIS — Z170 Estrogen receptor positive status [ER+]: Secondary | ICD-10-CM | POA: Diagnosis not present

## 2021-10-07 DIAGNOSIS — N390 Urinary tract infection, site not specified: Secondary | ICD-10-CM | POA: Insufficient documentation

## 2021-10-07 DIAGNOSIS — Z79811 Long term (current) use of aromatase inhibitors: Secondary | ICD-10-CM | POA: Insufficient documentation

## 2021-10-07 HISTORY — DX: Urinary tract infection, site not specified: N39.0

## 2021-11-01 ENCOUNTER — Telehealth: Payer: Self-pay | Admitting: Nurse Practitioner

## 2021-11-01 DIAGNOSIS — N3000 Acute cystitis without hematuria: Secondary | ICD-10-CM | POA: Diagnosis not present

## 2021-11-01 DIAGNOSIS — R8271 Bacteriuria: Secondary | ICD-10-CM | POA: Diagnosis not present

## 2021-11-01 NOTE — Telephone Encounter (Signed)
Pt is wanting to know what type of pna shots she has had. Please advise pt at [phone].

## 2021-11-03 NOTE — Telephone Encounter (Signed)
Spoke with patient and informed her she has had the Pneumonia 13 and 23 vaccines. Pt verbalized understanding.

## 2021-11-23 DIAGNOSIS — Z20822 Contact with and (suspected) exposure to covid-19: Secondary | ICD-10-CM | POA: Diagnosis not present

## 2021-11-29 ENCOUNTER — Ambulatory Visit: Payer: Medicare HMO

## 2021-12-01 ENCOUNTER — Telehealth: Payer: Medicare HMO

## 2021-12-07 ENCOUNTER — Ambulatory Visit (INDEPENDENT_AMBULATORY_CARE_PROVIDER_SITE_OTHER): Payer: Medicare HMO

## 2021-12-07 DIAGNOSIS — Z0000 Encounter for general adult medical examination without abnormal findings: Secondary | ICD-10-CM

## 2021-12-07 NOTE — Patient Instructions (Signed)
[name] , Thank you for taking time to come for your Medicare Wellness Visit. I appreciate your ongoing commitment to your health goals. Please review the following plan we discussed and let me know if I can assist you in the future.   Screening recommendations/referrals: Colonoscopy: done 06/05/19. Repeat 05/2029 Mammogram: done 04/14/21. Scheduled for follow up at [facility].  Bone Density: done 06/24/19. Scheduled for 02/09/22 Recommended yearly ophthalmology/optometry visit for glaucoma screening and checkup Recommended yearly dental visit for hygiene and checkup  Vaccinations: Influenza vaccine: done 07/12/21 Pneumococcal vaccine: done 11/04/21 Tdap vaccine: done 03/08/21 Shingles vaccine: done 07/20/21 & 10/26/21   Covid-19:done 11/23/19, 12/21/19, 08/17/20, 01/22/21 & 07/12/21  Advanced directives: Please bring a copy of your health care power of attorney and living will to the office at your convenience.   Conditions/risks identified: Keep up the great work!   Next appointment: Follow up in one year for your annual wellness visit    Preventive Care 65 Years and Older, Female Preventive care refers to lifestyle choices and visits with your health care provider that can promote health and wellness. What does preventive care include? A yearly physical exam. This is also called an annual well check. Dental exams once or twice a year. Routine eye exams. Ask your health care provider how often you should have your eyes checked. Personal lifestyle choices, including: Daily care of your teeth and gums. Regular physical activity. Eating a healthy diet. Avoiding tobacco and drug use. Limiting alcohol use. Practicing safe sex. Taking low-dose aspirin every day. Taking vitamin and mineral supplements as recommended by your health care provider. What happens during an annual well check? The services and screenings done by your health care provider during your annual well check will depend on  your age, overall health, lifestyle risk factors, and family history of disease. Counseling  Your health care provider may ask you questions about your: Alcohol use. Tobacco use. Drug use. Emotional well-being. Home and relationship well-being. Sexual activity. Eating habits. History of falls. Memory and ability to understand (cognition). Work and work Statistician. Reproductive health. Screening  You may have the following tests or measurements: Height, weight, and BMI. Blood pressure. Lipid and cholesterol levels. These may be checked every 5 years, or more frequently if you are over 5 years old. Skin check. Lung cancer screening. You may have this screening every year starting at age 24 if you have a 30-pack-year history of smoking and currently smoke or have quit within the past 15 years. Fecal occult blood test (FOBT) of the stool. You may have this test every year starting at age 60. Flexible sigmoidoscopy or colonoscopy. You may have a sigmoidoscopy every 5 years or a colonoscopy every 10 years starting at age 60. Hepatitis C blood test. Hepatitis B blood test. Sexually transmitted disease (STD) testing. Diabetes screening. This is done by checking your blood sugar (glucose) after you have not eaten for a while (fasting). You may have this done every 1-3 years. Bone density scan. This is done to screen for osteoporosis. You may have this done starting at age 21. Mammogram. This may be done every 1-2 years. Talk to your health care provider about how often you should have regular mammograms. Talk with your health care provider about your test results, treatment options, and if necessary, the need for more tests. Vaccines  Your health care provider may recommend certain vaccines, such as: Influenza vaccine. This is recommended every year. Tetanus, diphtheria, and acellular pertussis (Tdap, Td) vaccine. You  may need a Td booster every 10 years. Zoster vaccine. You may need this  after age 6. Pneumococcal 13-valent conjugate (PCV13) vaccine. One dose is recommended after age 98. Pneumococcal polysaccharide (PPSV23) vaccine. One dose is recommended after age 41. Talk to your health care provider about which screenings and vaccines you need and how often you need them. This information is not intended to replace advice given to you by your health care provider. Make sure you discuss any questions you have with your health care provider. Document Released: 11/05/2015 Document Revised: 06/28/2016 Document Reviewed: 08/10/2015 Elsevier Interactive Patient Education  2017 [place] Prevention in the Home Falls can cause injuries. They can happen to people of all ages. There are many things you can do to make your home safe and to help prevent falls. What can I do on the outside of my home? Regularly fix the edges of walkways and driveways and fix any cracks. Remove anything that might make you trip as you walk through a door, such as a raised step or threshold. Trim any bushes or trees on the path to your home. Use bright outdoor lighting. Clear any walking paths of anything that might make someone trip, such as rocks or tools. Regularly check to see if handrails are loose or broken. Make sure that both sides of any steps have handrails. Any raised decks and porches should have guardrails on the edges. Have any leaves, snow, or ice cleared regularly. Use sand or salt on walking paths during winter. Clean up any spills in your garage right away. This includes oil or grease spills. What can I do in the bathroom? Use night lights. Install grab bars by the toilet and in the tub and shower. Do not use towel bars as grab bars. Use non-skid mats or decals in the tub or shower. If you need to sit down in the shower, use a plastic, non-slip stool. Keep the floor dry. Clean up any water that spills on the floor as soon as it happens. Remove soap buildup in the tub or  shower regularly. Attach bath mats securely with double-sided non-slip rug tape. Do not have throw rugs and other things on the floor that can make you trip. What can I do in the bedroom? Use night lights. Make sure that you have a light by your bed that is easy to reach. Do not use any sheets or blankets that are too big for your bed. They should not hang down onto the floor. Have a firm chair that has side arms. You can use this for support while you get dressed. Do not have throw rugs and other things on the floor that can make you trip. What can I do in the kitchen? Clean up any spills right away. Avoid walking on wet floors. Keep items that you use a lot in easy-to-reach places. If you need to reach something above you, use a strong step stool that has a grab bar. Keep electrical cords out of the way. Do not use floor polish or wax that makes floors slippery. If you must use wax, use non-skid floor wax. Do not have throw rugs and other things on the floor that can make you trip. What can I do with my stairs? Do not leave any items on the stairs. Make sure that there are handrails on both sides of the stairs and use them. Fix handrails that are broken or loose. Make sure that handrails are as long as the  stairways. Check any carpeting to make sure that it is firmly attached to the stairs. Fix any carpet that is loose or worn. Avoid having throw rugs at the top or bottom of the stairs. If you do have throw rugs, attach them to the floor with carpet tape. Make sure that you have a light switch at the top of the stairs and the bottom of the stairs. If you do not have them, ask someone to add them for you. What else can I do to help prevent falls? Wear shoes that: Do not have high heels. Have rubber bottoms. Are comfortable and fit you well. Are closed at the toe. Do not wear sandals. If you use a stepladder: Make sure that it is fully opened. Do not climb a closed stepladder. Make  sure that both sides of the stepladder are locked into place. Ask someone to hold it for you, if possible. Clearly mark and make sure that you can see: Any grab bars or handrails. First and last steps. Where the edge of each step is. Use tools that help you move around (mobility aids) if they are needed. These include: Canes. Walkers. Scooters. Crutches. Turn on the lights when you go into a dark area. Replace any light bulbs as soon as they burn out. Set up your furniture so you have a clear path. Avoid moving your furniture around. If any of your floors are uneven, fix them. If there are any pets around you, be aware of where they are. Review your medicines with your doctor. Some medicines can make you feel dizzy. This can increase your chance of falling. Ask your doctor what other things that you can do to help prevent falls. This information is not intended to replace advice given to you by your health care provider. Make sure you discuss any questions you have with your health care provider. Document Released: 08/05/2009 Document Revised: 03/16/2016 Document Reviewed: 11/13/2014 Elsevier Interactive Patient Education  2017 [employer].

## 2021-12-07 NOTE — Progress Notes (Signed)
Subjective:   Heidi [name] is a 76 y.o. female who presents for Medicare Annual (Subsequent) preventive examination.  Virtual Visit via Telephone Note  I connected with  Heidi [name] on 12/07/21 at  8:15 AM EST by telephone and verified that I am speaking with the correct person using two identifiers.  Location: Patient: home Provider: [facility] Persons participating in the virtual visit: patient/Nurse Health Advisor   I discussed the limitations, risks, security and privacy concerns of performing an evaluation and management service by telephone and the availability of in person appointments. The patient expressed understanding and agreed to proceed.  Interactive audio and video telecommunications were attempted between this nurse and patient, however failed, due to patient having technical difficulties OR patient did not have access to video capability.  We continued and completed visit with audio only.  Some vital signs may be absent or patient reported.   Clemetine Marker, LPN   Review of Systems           Objective:    There were no vitals filed for this visit. There is no height or weight on file to calculate BMI.  Advanced Directives 12/07/2021 11/23/2020 11/19/2019 11/13/2018  Does Patient Have a Medical Advance Directive? Yes Yes No Yes  Type of Paramedic of [place];Living will [place];Living will - [place];Living will  Does patient want to make changes to medical advance directive? - - - No - Patient declined  Copy of [place] in Chart? No - copy requested No - copy requested - No - copy requested  Would patient like information on creating a medical advance directive? - - No - Patient declined -    Current Medications (verified) Outpatient Encounter Medications as of 12/07/2021  Medication Sig   ALPHA LIPOIC ACID PO Take by mouth at bedtime.   amLODipine (NORVASC) 10 MG  tablet Take 1 tablet (10 mg total) by mouth at bedtime.   Ascorbic Acid (SM CHEWABLE VITAMIN C) 500 MG CHEW Chew 500 mg by mouth daily.   aspirin 81 MG tablet Take 81 mg by mouth at bedtime.   Biotin 10 MG TABS Take by mouth daily.   cetirizine (ZYRTEC) 5 MG tablet Take 1 tablet (5 mg total) by mouth at bedtime.   cholecalciferol (VITAMIN D) 1000 units tablet Take 2 tablets daily. (Patient taking differently: Take 1,000 Units by mouth daily.)   Coenzyme Q10 (COQ10) 100 MG CAPS Take by mouth at bedtime.   fluticasone (FLONASE) 50 MCG/ACT nasal spray Place 2 sprays into both nostrils daily. (Patient taking differently: Place 2 sprays into both nostrils daily as needed.)   letrozole (FEMARA) 2.5 MG tablet Take 2.5 mg by mouth daily.   losartan (COZAAR) 100 MG tablet Take 1 tablet (100 mg total) by mouth daily.   NON FORMULARY CBD Gummy 10 mg nighty   pravastatin (PRAVACHOL) 40 MG tablet TAKE 1/2 TABLET (20MG  TOTAL) DAILY.   Probiotic Product (PROBIOTIC PO) 1 tablet.   No facility-administered encounter medications on file as of 12/07/2021.    Allergies (verified) Patient has no known allergies.   History: Past Medical History:  Diagnosis Date   Anxiety    Chronic rhinitis    DJD (degenerative joint disease)    GERD (gastroesophageal reflux disease)    History of headache    HTN (hypertension)    Hypercholesterolemia    IBS (irritable bowel syndrome)    Renal cell carcinoma 2001   Small  bowel mass    Venous insufficiency    Past Surgical History:  Procedure Laterality Date   NEPHRECTOMY  2001   right = for renal cell CA   small bowel mass removal     TOTAL ABDOMINAL HYSTERECTOMY     TUBAL LIGATION     Family History  Problem Relation Age of Onset   Healthy Mother    Diabetes Mother    Hyperlipidemia Mother    Hypertension Mother    Prostate cancer Father    Cancer Father    Breast cancer Sister    Cancer - Other Brother        neck ? lymph nodes   Colon cancer Neg Hx     Pancreatic cancer Neg Hx    Stomach cancer Neg Hx    Social History   Socioeconomic History   Marital status: Divorced    Spouse name: Not on file   Number of children: 2   Years of education: Not on file   Highest education level: Not on file  Occupational History   Not on file  Tobacco Use   Smoking status: Former    Packs/day: 0.25    Years: 10.00    Pack years: 2.50    Types: Cigarettes    Quit date: 10/24/1983    Years since quitting: 38.1   Smokeless tobacco: Never  Vaping Use   Vaping Use: Never used  Substance and Sexual Activity   Alcohol use: Not Currently   Drug use: No   Sexual activity: Yes    Partners: Male    Birth control/protection: Surgical    Comment: hysterectomy  Other Topics Concern   Not on file  Social History Narrative   Exercises 3x per week   Caffeine use: 2 cups per day   3 siblings healthy   Social Determinants of Health   Financial Resource Strain: Low Risk    Difficulty of Paying Living Expenses: Not hard at all  Food Insecurity: No Food Insecurity   Worried About Charity fundraiser in the Last Year: Never true   Ran Out of Food in the Last Year: Never true  Transportation Needs: No Transportation Needs   Lack of Transportation (Medical): No   Lack of Transportation (Non-Medical): No  Physical Activity: Sufficiently Active   Days of Exercise per Week: 5 days   Minutes of Exercise per Session: 60 min  Stress: No Stress Concern Present   Feeling of Stress : Not at all  Social Connections: Socially Isolated   Frequency of Communication with Friends and Family: More than three times a week   Frequency of Social Gatherings with Friends and Family: More than three times a week   Attends Religious Services: Never   Marine scientist or Organizations: No   Attends Music therapist: Never   Marital Status: Divorced    Tobacco Counseling Counseling given: Not Answered   Clinical Intake:  Pre-visit preparation  completed: Yes  Pain : No/denies pain     Nutritional Risks: None Diabetes: No  How often do you need to have someone help you when you read instructions, pamphlets, or other written materials from your doctor or pharmacy?: 1 - Never    Interpreter Needed?: No  Information entered by :: Clemetine Marker LPN   Activities of Daily Living In your present state of health, do you have any difficulty performing the following activities: 12/07/2021  Hearing? N  Vision? N  Difficulty concentrating or making  decisions? N  Walking or climbing stairs? N  Dressing or bathing? N  Doing errands, shopping? N  Preparing Food and eating ? N  Using the Toilet? N  In the past six months, have you accidently leaked urine? N  Do you have problems with loss of bowel control? N  Managing your Medications? N  Managing your Finances? N  Housekeeping or managing your Housekeeping? N  Some recent data might be hidden    Patient Care Team: Nche, Charlene Brooke, NP as PCP - General (Internal Medicine) Germaine Pomfret, [facility] as Pharmacist (Pharmacist)  Indicate any recent Medical Services you may have received from other than [facility] providers in the past year (date may be approximate).     Assessment:   This is a routine wellness examination for [place].  Hearing/Vision screen Hearing Screening - Comments:: Pt denies hearing difficulty Vision Screening - Comments:: Annual vision screenings done at [facility] issues and exercise activities discussed:     Goals Addressed             This Visit's Progress    Patient Stated   On track    Drink more water, increase activity & eat less salt       Depression Screen PHQ 2/9 Scores 12/07/2021 11/23/2020 11/19/2019 11/13/2018 11/06/2018 05/06/2018 11/08/2017  PHQ - 2 Score 0 0 0 0 0 0 0    Fall Risk Fall Risk  12/07/2021 11/23/2020 11/19/2019 11/13/2018 11/06/2018  Falls in the past year? 0 1 0 0 0  Number falls in past yr: 0 0 0 - -  Injury with  Fall? 0 0 0 - -  Risk for fall due to : No Fall Risks - - - -  Follow up Falls prevention discussed Falls prevention discussed Education provided;Falls prevention discussed - -    FALL RISK PREVENTION PERTAINING TO THE HOME:  Any stairs in or around the home? Yes  If so, are there any without handrails? Yes - 3 steps out front Home free of loose throw rugs in walkways, pet beds, electrical cords, etc? Yes  Adequate lighting in your home to reduce risk of falls? Yes   ASSISTIVE DEVICES UTILIZED TO PREVENT FALLS:  Life alert? No  Use of a cane, walker or w/c? No  Grab bars in the bathroom? No  Shower chair or bench in shower? No  Elevated toilet seat or a handicapped toilet? Yes  TIMED UP AND GO:  Was the test performed? No . Telephonic visit.   Cognitive Function: Normal cognitive status assessed by direct observation by this Nurse Health Advisor. No abnormalities found.   MMSE - Mini Mental State Exam 11/13/2018  Orientation to time 5  Orientation to Place 5  Registration 3  Attention/ Calculation 5  Recall 3  Language- name 2 objects 2  Language- repeat 1  Language- follow 3 step command 3  Language- read & follow direction 1  Write a sentence 1  Copy design 1  Total score 30        Immunizations Immunization History  Administered Date(s) Administered   Fluad Quad(high Dose 65+) 06/19/2019   Influenza Split 10/14/2009, 10/12/2010, 10/12/2011, 10/11/2012   Influenza Whole 10/14/2009, 10/12/2010   Influenza, High Dose Seasonal PF 06/19/2019   Influenza,inj,Quad PF,6+ Mos 08/27/2013, 10/14/2014, 11/24/2015, 10/02/2016   Influenza-Unspecified 10/12/2011, 10/11/2012, 07/23/2018, 06/19/2019, 06/24/2020, 07/12/2021   Moderna Sars-Covid-2 Vaccination 11/23/2019, 12/21/2019, 08/17/2020, 01/22/2021   PNEUMOCOCCAL CONJUGATE-20 11/04/2021   Pfizer Covid-19 Vaccine Bivalent Booster 76yrs & up  07/12/2021   Pneumococcal Conjugate-13 12/11/2016   Pneumococcal Polysaccharide-23  04/11/2012, 08/17/2020   Td 11/23/2010   Td,absorbed, Preservative Free, Adult Use, Lf Unspecified 11/23/2010   Tdap 03/08/2021   Zoster Recombinat (Shingrix) 07/20/2021, 10/26/2021    TDAP status: Up to date  Flu Vaccine status: Up to date  Pneumococcal vaccine status: Up to date  Covid-19 vaccine status: Completed vaccines  Qualifies for Shingles Vaccine? Yes   Zostavax completed Yes   Shingrix Completed?: Yes  Screening Tests Health Maintenance  Topic Date Due   MAMMOGRAM  04/14/2022   COLONOSCOPY (Pts 45-69yrs Insurance coverage will need to be confirmed)  06/23/2029   TETANUS/TDAP  03/09/2031   Pneumonia Vaccine 29+ Years old  Completed   INFLUENZA VACCINE  Completed   DEXA SCAN  Completed   COVID-19 Vaccine  Completed   Hepatitis C Screening  Completed   Zoster Vaccines- Shingrix  Completed   HPV VACCINES  Aged Out    Health Maintenance  There are no preventive care reminders to display for this patient.  Colorectal cancer screening: Type of screening: Colonoscopy. Completed 06/05/19. Repeat every 10 years  Mammogram status: Completed 04/14/21. Repeat every year. [facility]  Bone Density status: Completed 06/24/19. Results reflect: Bone density results: OSTEOPENIA. Repeat every 2 years. Scheduled for 02/09/22  Lung Cancer Screening: (Low Dose CT Chest recommended if Age 63-80 years, 30 pack-year currently smoking OR have quit w/in 15years.) does not qualify.   Additional Screening:  Hepatitis C Screening: does qualify; Completed 12/13/15  Vision Screening: Recommended annual ophthalmology exams for early detection of glaucoma and other disorders of the eye. Is the patient up to date with their annual eye exam?  Yes  Who is the provider or what is the name of the office in which the patient attends annual eye exams? [facility].   Dental Screening: Recommended annual dental exams for proper oral hygiene  Community Resource Referral /  Chronic Care Management: CRR required this visit?  No   CCM required this visit?  No      Plan:     I have personally reviewed and noted the following in the patients chart:   Medical and social history Use of alcohol, tobacco or illicit drugs  Current medications and supplements including opioid prescriptions.  Functional ability and status Nutritional status Physical activity Advanced directives List of other physicians Hospitalizations, surgeries, and ER visits in previous 12 months Vitals Screenings to include cognitive, depression, and falls Referrals and appointments  In addition, I have reviewed and discussed with patient certain preventive protocols, quality metrics, and best practice recommendations. A written personalized care plan for preventive services as well as general preventive health recommendations were provided to patient.     Clemetine Marker, LPN   7/85/8850   Nurse Notes: none

## 2021-12-08 ENCOUNTER — Telehealth: Payer: Medicare HMO

## 2022-01-03 DIAGNOSIS — Z853 Personal history of malignant neoplasm of breast: Secondary | ICD-10-CM | POA: Diagnosis not present

## 2022-01-03 DIAGNOSIS — Z79811 Long term (current) use of aromatase inhibitors: Secondary | ICD-10-CM | POA: Diagnosis not present

## 2022-01-03 DIAGNOSIS — Z170 Estrogen receptor positive status [ER+]: Secondary | ICD-10-CM | POA: Diagnosis not present

## 2022-01-03 DIAGNOSIS — Z08 Encounter for follow-up examination after completed treatment for malignant neoplasm: Secondary | ICD-10-CM | POA: Diagnosis not present

## 2022-01-03 DIAGNOSIS — Z87440 Personal history of urinary (tract) infections: Secondary | ICD-10-CM | POA: Diagnosis not present

## 2022-01-03 DIAGNOSIS — C50112 Malignant neoplasm of central portion of left female breast: Secondary | ICD-10-CM | POA: Diagnosis not present

## 2022-02-06 ENCOUNTER — Telehealth: Payer: Self-pay

## 2022-02-06 NOTE — Progress Notes (Signed)
? ? ?Chronic Care Management ?Pharmacy Assistant  ? ?Name: Heidi [name]  MRN: [id number] DOB: 1946-04-11 ? ?Reason for Encounter: Hypertension Disease State Call. ?  ?Recent office visits:  ?12/07/2021 Clemetine Marker LPN (PCP Office) Medicare Wellness Completed, No Medication Changes Noted  ? ?Recent consult visits:  ?01/03/2022 Dr. Nathaneil Canary MD (Hematology) Unable to see note ?10/04/2021 Dr. Nathaneil Canary MD (Hematology) No medication Changes noted ?10/03/2021 Dr. Dorene Grebe MD (Hematology) Unable to see note ? ?Hospital visits:  ?None in previous 6 months ? ?Medications: ?Outpatient Encounter Medications as of 02/06/2022  ?Medication Sig  ? ALPHA LIPOIC ACID PO Take by mouth at bedtime.  ? amLODipine (NORVASC) 10 MG tablet Take 1 tablet (10 mg total) by mouth at bedtime.  ? Ascorbic Acid (SM CHEWABLE VITAMIN C) 500 MG CHEW Chew 500 mg by mouth daily.  ? aspirin 81 MG tablet Take 81 mg by mouth at bedtime.  ? Biotin 10 MG TABS Take by mouth daily.  ? cetirizine (ZYRTEC) 5 MG tablet Take 1 tablet (5 mg total) by mouth at bedtime.  ? cholecalciferol (VITAMIN D) 1000 units tablet Take 2 tablets daily. (Patient taking differently: Take 1,000 Units by mouth daily.)  ? Coenzyme Q10 (COQ10) 100 MG CAPS Take by mouth at bedtime.  ? fluticasone (FLONASE) 50 MCG/ACT nasal spray Place 2 sprays into both nostrils daily. (Patient taking differently: Place 2 sprays into both nostrils daily as needed.)  ? letrozole (FEMARA) 2.5 MG tablet Take 2.5 mg by mouth daily.  ? losartan (COZAAR) 100 MG tablet Take 1 tablet (100 mg total) by mouth daily.  ? NON FORMULARY CBD Gummy 10 mg nighty  ? pravastatin (PRAVACHOL) 40 MG tablet TAKE 1/2 TABLET ('20MG'$  TOTAL) DAILY.  ? Probiotic Product (PROBIOTIC PO) 1 tablet.  ? ?No facility-administered encounter medications on file as of 02/06/2022.  ? ? ?Care Gaps: ?None  ?  ?Star Rating Drugs: ?Losartan 100 mg last filled 09/13/2021 for 90 day supply at [facility]. ?Pravastatin 40 mg last filled 09/13/2021  for 90 day supply at [facility]. ?  ?Medication Fill Gaps: ?None ? ?Reviewed chart prior to disease state call. Spoke with patient regarding BP ? ?Recent Office Vitals: ?BP Readings from Last 3 Encounters:  ?03/08/21 116/70  ?12/08/20 114/80  ?11/23/20 (!) 150/92  ? ?Pulse Readings from Last 3 Encounters:  ?03/08/21 70  ?12/08/20 68  ?11/23/20 70  ?  ?Wt Readings from Last 3 Encounters:  ?04/15/21 189 lb (85.7 kg)  ?03/08/21 192 lb 9.6 oz (87.4 kg)  ?12/08/20 184 lb 12.8 oz (83.8 kg)  ?  ? ?Kidney Function ?Lab Results  ?Component Value Date/Time  ? CREATININE 0.90 11/24/2020 10:44 AM  ? CREATININE 0.9 12/09/2019 12:00 AM  ? CREATININE 0.98 05/13/2019 10:55 AM  ? CREATININE 1.07 (H) 11/27/2017 11:43 AM  ? GFR 63.38 11/24/2020 10:44 AM  ? GFRNONAA 53 (L) 11/27/2017 11:43 AM  ? GFRAA 61 11/27/2017 11:43 AM  ? ? ? ?  Latest Ref Rng & Units 11/24/2020  ? 10:44 AM 12/09/2019  ? 12:00 AM 05/13/2019  ? 10:55 AM  ?BMP  ?Glucose 70 - 99 mg/dL 146    96    ?BUN 6 - 23 mg/dL '12   12      11    '$ ?Creatinine 0.40 - 1.20 mg/dL 0.90   0.9      0.98    ?Sodium 135 - 145 mEq/L 138   138      139    ?Potassium 3.5 -  5.1 mEq/L 4.0   3.9      4.3    ?Chloride 96 - 112 mEq/L 100    105    ?CO2 19 - 32 mEq/L 30    26    ?Calcium 8.4 - 10.5 mg/dL 10.0   10.0      9.5    ?  ? This result is from an external source.  ? ? ?Current antihypertensive regimen:  ?Amlodipine 10 mg at bedtime  ?Losartan 100 mg daily  ? ?Patient denies lightheadedness,dizziness or headaches. ? ?How often are you checking your Blood Pressure?  Patient states when she spoke with the clinical pharmacist she was not inform to check her blood pressure, so she has not been checking her blood pressure. ? ?Current home BP readings: None ID ? ?What recent interventions/DTPs have been made by any provider to improve Blood Pressure control since last CPP Visit: None ID ? ?Any recent hospitalizations or ED visits since last visit with CPP? No ? ?What diet changes have been  made to improve Blood Pressure Control?  ?Patient denies any changes with her diet. ? ?What exercise is being done to improve your Blood Pressure Control?  ?Patient denies any changes at this time. ? ?Adherence Review: ?Is the patient currently on ACE/ARB medication? Yes ?Does the patient have >5 day gap between last estimated fill dates? Yes ? ?Telephone follow up appointment with Care management team member scheduled for : 04/13/2022 at 8:30 am. ? ?Anderson Malta ?Clinical Pharmacist Assistant ?[phone]  ? ? ?

## 2022-02-09 ENCOUNTER — Ambulatory Visit
Admission: RE | Admit: 2022-02-09 | Discharge: 2022-02-09 | Disposition: A | Discharge status: Home / Self Care | Payer: Medicare HMO | Source: Ambulatory Visit | Attending: Nurse Practitioner | Admitting: Nurse Practitioner

## 2022-02-09 DIAGNOSIS — Z780 Asymptomatic menopausal state: Secondary | ICD-10-CM | POA: Diagnosis not present

## 2022-02-09 DIAGNOSIS — Z139 Encounter for screening, unspecified: Secondary | ICD-10-CM

## 2022-02-15 DIAGNOSIS — Z87891 Personal history of nicotine dependence: Secondary | ICD-10-CM | POA: Diagnosis not present

## 2022-02-15 DIAGNOSIS — Z08 Encounter for follow-up examination after completed treatment for malignant neoplasm: Secondary | ICD-10-CM | POA: Diagnosis not present

## 2022-02-15 DIAGNOSIS — Z9889 Other specified postprocedural states: Secondary | ICD-10-CM | POA: Diagnosis not present

## 2022-02-15 DIAGNOSIS — C50112 Malignant neoplasm of central portion of left female breast: Secondary | ICD-10-CM | POA: Diagnosis not present

## 2022-02-15 DIAGNOSIS — Z79899 Other long term (current) drug therapy: Secondary | ICD-10-CM | POA: Diagnosis not present

## 2022-02-15 DIAGNOSIS — Z170 Estrogen receptor positive status [ER+]: Secondary | ICD-10-CM | POA: Diagnosis not present

## 2022-02-15 DIAGNOSIS — C50212 Malignant neoplasm of upper-inner quadrant of left female breast: Secondary | ICD-10-CM | POA: Diagnosis not present

## 2022-02-15 DIAGNOSIS — Z853 Personal history of malignant neoplasm of breast: Secondary | ICD-10-CM | POA: Diagnosis not present

## 2022-02-27 DIAGNOSIS — R7309 Other abnormal glucose: Secondary | ICD-10-CM | POA: Diagnosis not present

## 2022-02-27 DIAGNOSIS — C7B8 Other secondary neuroendocrine tumors: Secondary | ICD-10-CM | POA: Diagnosis not present

## 2022-02-27 DIAGNOSIS — C7A8 Other malignant neuroendocrine tumors: Secondary | ICD-10-CM | POA: Diagnosis not present

## 2022-03-29 DIAGNOSIS — C50212 Malignant neoplasm of upper-inner quadrant of left female breast: Secondary | ICD-10-CM | POA: Diagnosis not present

## 2022-03-29 DIAGNOSIS — Z923 Personal history of irradiation: Secondary | ICD-10-CM | POA: Diagnosis not present

## 2022-03-29 DIAGNOSIS — Z08 Encounter for follow-up examination after completed treatment for malignant neoplasm: Secondary | ICD-10-CM | POA: Diagnosis not present

## 2022-03-29 DIAGNOSIS — Z87891 Personal history of nicotine dependence: Secondary | ICD-10-CM | POA: Diagnosis not present

## 2022-03-29 DIAGNOSIS — Z170 Estrogen receptor positive status [ER+]: Secondary | ICD-10-CM | POA: Diagnosis not present

## 2022-03-29 DIAGNOSIS — Z853 Personal history of malignant neoplasm of breast: Secondary | ICD-10-CM | POA: Diagnosis not present

## 2022-03-29 DIAGNOSIS — Z9889 Other specified postprocedural states: Secondary | ICD-10-CM | POA: Diagnosis not present

## 2022-04-05 NOTE — Progress Notes (Deleted)
76 y.o. [id number] Divorced Black or African American Not Hispanic or Latino female here for breast and pelvic exam.       Patient's last menstrual period was 10/23/1994 (approximate).          Sexually active: {yes no:314532}  The current method of family planning is {contraception:315051}.    Exercising: {yes no:314532}  {types:19826} Smoker:  {YES P5382123  Health Maintenance: Pap:   2009 normal History of abnormal Pap:  no MMG:  04/2021 see epic  BMD:   02/09/22 normal  Colonoscopy:  2020 normal TDaP:  2021 Gardasil: n/a   reports that she quit smoking about 38 years ago. Her smoking use included cigarettes. She has a 2.50 pack-year smoking history. She has never used smokeless tobacco. She reports that she does not currently use alcohol. She reports that she does not use drugs.  Past Medical History:  Diagnosis Date   Anxiety    Chronic rhinitis    DJD (degenerative joint disease)    GERD (gastroesophageal reflux disease)    History of headache    HTN (hypertension)    Hypercholesterolemia    IBS (irritable bowel syndrome)    Renal cell carcinoma 2001   Small bowel mass    Venous insufficiency     Past Surgical History:  Procedure Laterality Date   NEPHRECTOMY  2001   right = for renal cell CA   small bowel mass removal     TOTAL ABDOMINAL HYSTERECTOMY     TUBAL LIGATION      Current Outpatient Medications  Medication Sig Dispense Refill   ALPHA LIPOIC ACID PO Take by mouth at bedtime.     amLODipine (NORVASC) 10 MG tablet Take 1 tablet (10 mg total) by mouth at bedtime. 90 tablet 3   Ascorbic Acid (SM CHEWABLE VITAMIN C) 500 MG CHEW Chew 500 mg by mouth daily.     aspirin 81 MG tablet Take 81 mg by mouth at bedtime.     Biotin 10 MG TABS Take by mouth daily.     cetirizine (ZYRTEC) 5 MG tablet Take 1 tablet (5 mg total) by mouth at bedtime.     cholecalciferol (VITAMIN D) 1000 units tablet Take 2 tablets daily. (Patient taking differently: Take 1,000 Units by  mouth daily.) 30 tablet 3   Coenzyme Q10 (COQ10) 100 MG CAPS Take by mouth at bedtime.     fluticasone (FLONASE) 50 MCG/ACT nasal spray Place 2 sprays into both nostrils daily. (Patient taking differently: Place 2 sprays into both nostrils daily as needed.) 16 g 3   letrozole (FEMARA) 2.5 MG tablet Take 2.5 mg by mouth daily.     losartan (COZAAR) 100 MG tablet Take 1 tablet (100 mg total) by mouth daily. 90 tablet 3   NON FORMULARY CBD Gummy 10 mg nighty     pravastatin (PRAVACHOL) 40 MG tablet TAKE 1/2 TABLET ('20MG'$  TOTAL) DAILY. 45 tablet 3   Probiotic Product (PROBIOTIC PO) 1 tablet.     No current facility-administered medications for this visit.    Family History  Problem Relation Age of Onset   Healthy Mother    Diabetes Mother    Hyperlipidemia Mother    Hypertension Mother    Prostate cancer Father    Cancer Father    Breast cancer Sister    Cancer - Other Brother        neck ? lymph nodes   Colon cancer Neg Hx    Pancreatic cancer Neg Hx  Stomach cancer Neg Hx     Review of Systems  Exam:   LMP 10/23/1994 (Approximate)   Weight change: '@WEIGHTCHANGE'$ @ Height:      Ht Readings from Last 3 Encounters:  04/15/21 5' 5.5" (1.664 m)  12/08/20 '5\' 5"'$  (1.651 m)  11/23/20 '5\' 5"'$  (1.651 m)    General appearance: alert, cooperative and appears stated age Head: Normocephalic, without obvious abnormality, atraumatic Neck: no adenopathy, supple, symmetrical, trachea midline and thyroid {CHL AMB PHY EX THYROID NORM DEFAULT:[phone]::"normal to inspection and palpation"} Lungs: clear to auscultation bilaterally Cardiovascular: regular rate and rhythm Breasts: {Exam; breast:13139::"normal appearance, no masses or tenderness"} Abdomen: soft, non-tender; non distended,  no masses,  no organomegaly Extremities: extremities normal, atraumatic, no cyanosis or edema Skin: Skin color, texture, turgor normal. No rashes or lesions Lymph nodes: Cervical, supraclavicular, and axillary  nodes normal. No abnormal inguinal nodes palpated Neurologic: Grossly normal   Pelvic: External genitalia:  no lesions              Urethra:  normal appearing urethra with no masses, tenderness or lesions              Bartholins and Skenes: normal                 Vagina: normal appearing vagina with normal color and discharge, no lesions              Cervix: {CHL AMB PHY EX CERVIX NORM DEFAULT:[phone]::"no lesions"}               Bimanual Exam:  Uterus:  {CHL AMB PHY EX UTERUS NORM DEFAULT:[phone]::"normal size, contour, position, consistency, mobility, non-tender"}              Adnexa: {CHL AMB PHY EX ADNEXA NO MASS [phone]::"no mass, fullness, tenderness"}               Rectovaginal: Confirms               Anus:  normal sphincter tone, no lesions  *** chaperoned for the exam.  A:  Well Woman with normal exam  P:

## 2022-04-12 ENCOUNTER — Telehealth: Payer: Self-pay

## 2022-04-12 NOTE — Progress Notes (Cosign Needed)
Chronic Care Management APPOINTMENT REMINDER   Heidi [name] was reminded to have all medications, supplements and any blood glucose and blood pressure readings available for review with Junius Argyle, Pharm. D, at her telephone visit on 04/13/2022 at 8:30 am.  Patient confirmed appointment.   [place] Pharmacist Assistant [phone]

## 2022-04-13 ENCOUNTER — Ambulatory Visit: Payer: Medicare HMO

## 2022-04-13 DIAGNOSIS — I10 Essential (primary) hypertension: Secondary | ICD-10-CM

## 2022-04-13 DIAGNOSIS — Z170 Estrogen receptor positive status [ER+]: Secondary | ICD-10-CM

## 2022-04-13 DIAGNOSIS — E7800 Pure hypercholesterolemia, unspecified: Secondary | ICD-10-CM

## 2022-04-13 NOTE — Patient Instructions (Signed)
Visit Information It was great speaking with you today!  Please let me know if you have any questions about our visit.   Goals Addressed             This Visit's Progress    Track and Manage My Blood Pressure-Hypertension   On track    Timeframe:  Long-Range Goal Priority:  High Start Date:   11/30/2020                          Expected End Date:  06/01/2023                    Follow Up within 90 days   - check blood pressure daily    Why is this important?   You won't feel high blood pressure, but it can still hurt your blood vessels.  High blood pressure can cause heart or kidney problems. It can also cause a stroke.  Making lifestyle changes like losing a little weight or eating less salt will help.  Checking your blood pressure at home and at different times of the day can help to control blood pressure.  If the doctor prescribes medicine remember to take it the way the doctor ordered.  Call the office if you cannot afford the medicine or if there are questions about it.     Notes:         Patient Care Plan: General Pharmacy (Adult)     Problem Identified: Hypertension, Hyperlipidemia, GERD, Anxiety, Allergic Rhinitis and Irritable Bowel Syndrome and History of Carcinoma   Priority: High     Long-Range Goal: Patient-Specific Goal   Start Date: 11/30/2020  Expected End Date: 04/14/2023  This Visit's Progress: On track  Recent Progress: On track  Priority: High  Note:   Current Barriers:  No barriers currently noted  Pharmacist Clinical Goal(s):  Over the next 90 days, patient will achieve control of Blood pressure as evidenced by BP < 140/90 through collaboration with PharmD and provider.   Interventions: 1:1 collaboration with Nche, Charlene Brooke, NP regarding development and update of comprehensive plan of care as evidenced by provider attestation and co-signature Inter-disciplinary care team collaboration (see longitudinal plan of care) Comprehensive medication  review performed; medication list updated in electronic medical record  Hypertension (BP goal <140/90) -controlled -Current treatment: Amlodipine 10 mg at bedtime  Losartan 100 mg daily  -Medications previously tried: NA  -Current home readings: 125-139/84-95  -Current dietary habits: Trying to eat plenty of fruits and vegetables. Drinking plenty of water.  -Current exercise habits: Walking daily, exercise routines 3x daily  (low-impact, weights)  -Denies hypotensive/hypertensive symptoms. Rarely has dizziness spells, recalls one instance of lightheadedness  -Reports some stomach aches since starting Amlodipine.  -Recommended to continue current medication  Hyperlipidemia: (LDL goal < 100) -controlled -Current treatment: Pravastatin 40 mg 1/2 tablet daily  -Medications previously tried: NA  -Patient interested in stopping pravastatin if possible  -Recommended to continue current medication  Breast Cancer (Goal: Prolong Survival) -Controlled -Current treatment  Letrozole 2.5 mg daily  -Medications previously tried: NA .  -Patient reports continued hot flashes with letrozole therapy, -Recommended to continue current medication  Patient Goals/Self-Care Activities Over the next 90 days, patient will:  - take medications as prescribed check blood pressure Weekly, document, and provide at future appointments  Follow Up Plan: Telephone follow up appointment with care management team member scheduled for:  10/12/22 at 8:30 AM  Patient agreed to services and verbal consent obtained.   Patient verbalizes understanding of instructions and care plan provided today and agrees to view in [place]. Active MyChart status and patient understanding of how to access instructions and care plan via MyChart confirmed with patient.     Junius Argyle, PharmD, Para March, CPP Clinical Pharmacist Practitioner  [facility]  [phone]

## 2022-04-19 ENCOUNTER — Ambulatory Visit: Payer: Medicare HMO | Admitting: Obstetrics and Gynecology

## 2022-04-19 NOTE — Progress Notes (Signed)
76 y.o. [id number] Divorced Black or [place] American Not Hispanic or Latino female here for annual exam.  H/O hysterectomy.   Diagnosed with breast cancer last year. S/P lumpectomy in 8/22 then radiation.    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 Recent PET scan with mild, non specific changes, with have f/u imaging in 3 months.   H/O renal cell cancer. Has one kidney. Uses a probiotic which helps her bladder and bowels.   Patient's last menstrual period was 10/23/1994 (approximate).          Sexually active: Yes.    The current method of family planning is status post hysterectomy.    Exercising: Yes.    Gym/ health club routine includes cardio and light weights. Smoker:  no  Health Maintenance: Pap:  2009 normal  History of abnormal Pap:  no MMG:  in 7/22 diagnosed with breast cancer on the left Last mammogram on 02/15/22 Bi-Rads Cat 2 BMD:   02/09/22 normal  Colonoscopy: 2020 normal  TDaP:  2021  Gardasil: n/a   reports that she quit smoking about 38 years ago. Her smoking use included cigarettes. She has a 2.50 pack-year smoking history. She has never used smokeless tobacco. She reports that she does not currently use alcohol. She reports that she does not use drugs. Retired Government social research officer. 2 grown kids, live in [place]. 2 grandchildren: 86 and 13.  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     amLODipine (NORVASC) 10 MG tablet Take 1 tablet (10 mg total) by mouth at bedtime. 90 tablet 3   Ascorbic Acid (SM CHEWABLE VITAMIN C) 500 MG CHEW Chew 500 mg by mouth daily.     aspirin 81 MG tablet Take 81 mg by mouth at bedtime.     Biotin 10 MG TABS Take by mouth daily.     cetirizine (ZYRTEC) 5 MG tablet Take 1 tablet (5 mg total) by mouth at bedtime.     cholecalciferol (VITAMIN D) 1000 units tablet Take 2 tablets daily. (Patient taking differently: Take 1,000 Units by mouth daily.) 30 tablet 3   Coenzyme Q10 (COQ10) 100 MG CAPS Take by mouth at bedtime.     fluticasone (FLONASE) 50 MCG/ACT nasal spray Place 2 sprays into both nostrils daily. (Patient taking differently: Place 2 sprays into both nostrils daily as needed.) 16 g 3  letrozole (FEMARA) 2.5 MG tablet Take 2.5 mg by mouth daily.     losartan (COZAAR) 100 MG tablet Take 1 tablet (100 mg total) by mouth daily. 90 tablet 3   NON FORMULARY CBD Gummy 10 mg nighty     pravastatin (PRAVACHOL) 40 MG tablet TAKE 1/2 TABLET ('20MG'$  TOTAL) DAILY. 45 tablet 3   Probiotic Product (PROBIOTIC PO) 1 tablet.     No current facility-administered medications for this visit.    Family History  Problem Relation Age of Onset   Healthy Mother    Diabetes Mother    Hyperlipidemia Mother    Hypertension Mother    Prostate cancer Father    Cancer Father    Breast cancer Sister    Cancer - Other Brother        neck ? lymph nodes   Colon cancer Neg Hx    Pancreatic cancer Neg Hx    Stomach cancer Neg Hx     Review of Systems  Exam:   BP 122/62    Pulse 77   Ht '5\' 5"'$  (1.651 m)   Wt 190 lb (86.2 kg)   LMP 10/23/1994 (Approximate)   SpO2 100%   BMI 31.62 kg/m   Weight change: '@WEIGHTCHANGE'$ @ Height:   Height: '5\' 5"'$  (165.1 cm)  Ht Readings from Last 3 Encounters:  04/28/22 '5\' 5"'$  (1.651 m)  04/20/22 5' 5.5" (1.664 m)  04/15/21 5' 5.5" (1.664 m)    General appearance: alert, cooperative and appears stated age Head: Normocephalic, without obvious abnormality, atraumatic Neck: no adenopathy, supple, symmetrical, trachea midline and thyroid normal to inspection and palpation Breasts: normal appearance, no masses or tenderness Abdomen: soft, non-tender; non distended,  no masses,  no organomegaly Extremities: extremities normal, atraumatic, no cyanosis or edema Skin: Skin color, texture, turgor normal. No rashes or lesions Lymph nodes: Cervical, supraclavicular, and axillary nodes normal. No abnormal inguinal nodes palpated Neurologic: Grossly normal   Pelvic: External genitalia:  no lesions              Urethra:  normal appearing urethra with no masses, tenderness or lesions              Bartholins and Skenes: normal                 Vagina: normal appearing vagina with normal color and discharge, no lesions              Cervix: absent               Bimanual Exam:  Uterus:  uterus absent              Adnexa: no mass, fullness, tenderness               Rectovaginal: Confirms               Anus:  normal sphincter tone, no lesions  Gae Dry chaperoned for the exam.  1. GYN exam for high-risk Medicare patient Discussed breast self exam Discussed calcium and vit D intake Labs with primary  Colonoscopy UTD DEXA UTD  2. History of breast cancer Mammogram UTD  3. Low libido Given reading suggestions

## 2022-04-20 ENCOUNTER — Encounter: Payer: Self-pay | Admitting: Nurse Practitioner

## 2022-04-20 ENCOUNTER — Ambulatory Visit (INDEPENDENT_AMBULATORY_CARE_PROVIDER_SITE_OTHER): Payer: Medicare HMO | Admitting: Nurse Practitioner

## 2022-04-20 VITALS — BP 120/78 | HR 66 | Temp 96.5°F | Ht 65.5 in | Wt 189.8 lb

## 2022-04-20 DIAGNOSIS — I10 Essential (primary) hypertension: Secondary | ICD-10-CM | POA: Diagnosis not present

## 2022-04-20 DIAGNOSIS — R7303 Prediabetes: Secondary | ICD-10-CM | POA: Diagnosis not present

## 2022-04-20 DIAGNOSIS — C7B8 Other secondary neuroendocrine tumors: Secondary | ICD-10-CM

## 2022-04-20 DIAGNOSIS — E782 Mixed hyperlipidemia: Secondary | ICD-10-CM

## 2022-04-20 DIAGNOSIS — C7A8 Other malignant neuroendocrine tumors: Secondary | ICD-10-CM | POA: Diagnosis not present

## 2022-04-20 DIAGNOSIS — Z905 Acquired absence of kidney: Secondary | ICD-10-CM | POA: Insufficient documentation

## 2022-04-20 MED ORDER — AMLODIPINE BESYLATE 10 MG PO TABS: 10.0000 mg | ORAL_TABLET | Freq: Every day | ORAL | 3 refills | Status: DC

## 2022-04-20 MED ORDER — LOSARTAN POTASSIUM 100 MG PO TABS: 100.0000 mg | ORAL_TABLET | Freq: Every day | ORAL | 3 refills | Status: DC

## 2022-04-20 MED ORDER — PRAVASTATIN SODIUM 40 MG PO TABS: ORAL_TABLET | ORAL | 3 refills | Status: DC

## 2022-04-20 NOTE — Assessment & Plan Note (Signed)
BP at goal with losartan and amlodipine BP Readings from Last 3 Encounters:  04/20/22 120/78  03/08/21 116/70  12/08/20 114/80   Maintain med dose. Refill sent Reviewed CMP completed by oncology: WNL

## 2022-04-20 NOTE — Progress Notes (Signed)
Established Patient Visit  Patient: Heidi [name]   DOB: 1946/04/14   76 y.o. 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 Last app 03/2022 Labs 03/2022:CA at 5 and CA 19-9 at < 0.8. Upcoming PET scan 04/2022 Has f/up with surgical and medical oncology every 69month  Prediabetes hgbA1c of 6.1% 03/2022 We discussed importance of her risk of becoming diabetic and need for diet modification. she agreed to nutrition referral.  Essential hypertension BP at goal with losartan and amlodipine BP Readings from Last 3 Encounters:  04/20/22 120/78  03/08/21 116/70  12/08/20 114/80   Maintain med dose. Refill sent Reviewed CMP completed by oncology: WNL  Hyperlipidemia ASCVD risk at 11.7% LDL at goal Advised to maintain pravastatin dose Repeat lipid panel (fasting)  Wt Readings from Last 3 Encounters:  04/20/22 189 lb 12.8 oz (86.1 kg)  04/15/21 189 lb (85.7 kg)  03/08/21 192 lb 9.6 oz (87.4 kg)     Reviewed medical, surgical, and social history today  Medications: Outpatient Medications Prior to Visit  Medication Sig   ALPHA LIPOIC ACID PO Take by mouth at bedtime.   Ascorbic Acid (SM CHEWABLE VITAMIN C) 500 MG CHEW Chew 500 mg by mouth daily.   aspirin 81 MG tablet Take 81 mg by mouth at bedtime.   Biotin 10 MG TABS Take by mouth daily.   cetirizine (ZYRTEC) 5 MG tablet Take 1 tablet (5 mg total) by mouth at bedtime.   cholecalciferol (VITAMIN D) 1000 units tablet Take 2 tablets daily. (Patient taking  differently: Take 1,000 Units by mouth daily.)   Coenzyme Q10 (COQ10) 100 MG CAPS Take by mouth at bedtime.   fluticasone (FLONASE) 50 MCG/ACT nasal spray Place 2 sprays into both nostrils daily. (Patient taking differently: Place 2 sprays into both nostrils daily as needed.)   letrozole (FEMARA) 2.5 MG tablet Take 2.5 mg by mouth daily.   NON FORMULARY CBD Gummy 10 mg nighty   Probiotic Product (PROBIOTIC PO) 1 tablet.   [DISCONTINUED] amLODipine (NORVASC) 10 MG tablet Take 1 tablet (10 mg total) by mouth at bedtime.   [DISCONTINUED] losartan (COZAAR) 100 MG tablet Take 1 tablet (100 mg total) by mouth daily.   [DISCONTINUED] pravastatin (PRAVACHOL) 40 MG tablet TAKE 1/2 TABLET ('20MG'$  TOTAL) DAILY.   No facility-administered medications prior to visit.   Reviewed past medical and social history.   ROS per HPI above      Objective:  BP 120/78 (BP Location: Right Arm, Patient Position: Sitting, Cuff Size: Normal)   Pulse 66   Temp (!) 96.5 F (35.8 C) (Temporal)   Ht 5' 5.5" (1.664 m)   Wt 189 lb 12.8 oz (86.1 kg)   LMP 10/23/1994 (Approximate)   SpO2 97%   BMI 31.10 kg/m      Physical Exam  No results found for any visits on 04/20/22.    Assessment & Plan:    Problem List Items Addressed This Visit       Cardiovascular and Mediastinum  Essential hypertension - Primary    BP at goal with losartan and amlodipine BP Readings from Last 3 Encounters:  04/20/22 120/78  03/08/21 116/70  12/08/20 114/80   Maintain med dose. 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 Last app 03/2022 Labs 03/2022:CA at 5 and CA 19-9 at < 0.8. Upcoming PET scan 04/2022 Has f/up with surgical and medical oncology every 17month        Other   Hyperlipidemia    ASCVD risk at 11.7% LDL at goal Advised to maintain pravastatin dose Repeat lipid panel (fasting)      Relevant Medications   pravastatin (PRAVACHOL) 40 MG tablet   amLODipine (NORVASC) 10 MG tablet   losartan (COZAAR) 100 MG tablet   Other Relevant Orders   Lipid panel   Referral to Nutrition and Diabetes Services   Prediabetes    hgbA1c of 6.1% 03/2022 We discussed importance of her risk of becoming diabetic and need for diet modification. she agreed to nutrition referral.      Relevant Orders   Referral to Nutrition and Diabetes Services   Return in about 3 months (around 07/21/2022) for HTN and prediabetes.     CWilfred Lacy NP

## 2022-04-20 NOTE — Assessment & Plan Note (Signed)
hgbA1c of 6.1% 03/2022 We discussed importance of her risk of becoming diabetic and need for diet modification. she agreed to nutrition referral.

## 2022-04-20 NOTE — Assessment & Plan Note (Signed)
ASCVD risk at 11.7% LDL at goal Advised to maintain pravastatin dose Repeat lipid panel (fasting)

## 2022-04-20 NOTE — Assessment & Plan Note (Addendum)
S/p resection of duodenum, ovarries, appendix, and gallbladder 2020  repeat CT ABD/chest/pelvis 02/2022: no active disease Mammogram 01/2022: WNL with scattered fibroglandular density Followed by [facility] oncology. Last app 03/2022 Labs 03/2022:CA at 5 and CA 19-9 at < 0.8. Upcoming PET scan 04/2022 Has f/up with surgical and medical oncology every 39month

## 2022-04-20 NOTE — Patient Instructions (Signed)
Schedule lab appt Maintain current medications

## 2022-04-21 ENCOUNTER — Other Ambulatory Visit (INDEPENDENT_AMBULATORY_CARE_PROVIDER_SITE_OTHER): Payer: Medicare HMO

## 2022-04-21 DIAGNOSIS — E782 Mixed hyperlipidemia: Secondary | ICD-10-CM | POA: Diagnosis not present

## 2022-04-21 LAB — LIPID PANEL
Cholesterol: 154 mg/dL (ref 0–200)
HDL: 49.2 mg/dL (ref >39.00)
LDL Cholesterol: 86 mg/dL (ref 0–99)
NonHDL: 104.87
Total CHOL/HDL Ratio: 3
Triglycerides: 96 mg/dL (ref 0.0–149.0)
VLDL: 19.2 mg/dL (ref 0.0–40.0)

## 2022-04-24 DIAGNOSIS — R970 Elevated carcinoembryonic antigen [CEA]: Secondary | ICD-10-CM | POA: Diagnosis not present

## 2022-04-24 DIAGNOSIS — C7A8 Other malignant neuroendocrine tumors: Secondary | ICD-10-CM | POA: Diagnosis not present

## 2022-04-24 DIAGNOSIS — Z90710 Acquired absence of both cervix and uterus: Secondary | ICD-10-CM | POA: Diagnosis not present

## 2022-04-24 DIAGNOSIS — C50212 Malignant neoplasm of upper-inner quadrant of left female breast: Secondary | ICD-10-CM | POA: Diagnosis not present

## 2022-04-24 DIAGNOSIS — Z170 Estrogen receptor positive status [ER+]: Secondary | ICD-10-CM | POA: Diagnosis not present

## 2022-04-24 DIAGNOSIS — C7B8 Other secondary neuroendocrine tumors: Secondary | ICD-10-CM | POA: Diagnosis not present

## 2022-04-24 DIAGNOSIS — C641 Malignant neoplasm of right kidney, except renal pelvis: Secondary | ICD-10-CM | POA: Diagnosis not present

## 2022-04-24 DIAGNOSIS — C50912 Malignant neoplasm of unspecified site of left female breast: Secondary | ICD-10-CM | POA: Diagnosis not present

## 2022-04-27 DIAGNOSIS — N3021 Other chronic cystitis with hematuria: Secondary | ICD-10-CM | POA: Diagnosis not present

## 2022-04-28 ENCOUNTER — Encounter: Payer: Self-pay | Admitting: Obstetrics and Gynecology

## 2022-04-28 ENCOUNTER — Ambulatory Visit (INDEPENDENT_AMBULATORY_CARE_PROVIDER_SITE_OTHER): Payer: Medicare HMO | Admitting: Obstetrics and Gynecology

## 2022-04-28 VITALS — BP 122/62 | HR 77 | Ht 65.0 in | Wt 190.0 lb

## 2022-04-28 DIAGNOSIS — R6882 Decreased libido: Secondary | ICD-10-CM

## 2022-04-28 DIAGNOSIS — Z853 Personal history of malignant neoplasm of breast: Secondary | ICD-10-CM

## 2022-04-28 DIAGNOSIS — Z9189 Other specified personal risk factors, not elsewhere classified: Secondary | ICD-10-CM

## 2022-07-04 DIAGNOSIS — Z8042 Family history of malignant neoplasm of prostate: Secondary | ICD-10-CM | POA: Diagnosis not present

## 2022-07-04 DIAGNOSIS — Z8589 Personal history of malignant neoplasm of other organs and systems: Secondary | ICD-10-CM | POA: Diagnosis not present

## 2022-07-04 DIAGNOSIS — Z87440 Personal history of urinary (tract) infections: Secondary | ICD-10-CM | POA: Diagnosis not present

## 2022-07-04 DIAGNOSIS — Z79811 Long term (current) use of aromatase inhibitors: Secondary | ICD-10-CM | POA: Diagnosis not present

## 2022-07-04 DIAGNOSIS — Z85528 Personal history of other malignant neoplasm of kidney: Secondary | ICD-10-CM | POA: Diagnosis not present

## 2022-07-04 DIAGNOSIS — C50212 Malignant neoplasm of upper-inner quadrant of left female breast: Secondary | ICD-10-CM | POA: Diagnosis not present

## 2022-07-04 DIAGNOSIS — Z800 Family history of malignant neoplasm of digestive organs: Secondary | ICD-10-CM | POA: Diagnosis not present

## 2022-07-04 DIAGNOSIS — Z9889 Other specified postprocedural states: Secondary | ICD-10-CM | POA: Diagnosis not present

## 2022-07-04 DIAGNOSIS — Z803 Family history of malignant neoplasm of breast: Secondary | ICD-10-CM | POA: Diagnosis not present

## 2022-07-04 DIAGNOSIS — Z90722 Acquired absence of ovaries, bilateral: Secondary | ICD-10-CM | POA: Diagnosis not present

## 2022-07-04 DIAGNOSIS — Z90710 Acquired absence of both cervix and uterus: Secondary | ICD-10-CM | POA: Diagnosis not present

## 2022-07-04 DIAGNOSIS — Z87891 Personal history of nicotine dependence: Secondary | ICD-10-CM | POA: Diagnosis not present

## 2022-07-04 DIAGNOSIS — Z780 Asymptomatic menopausal state: Secondary | ICD-10-CM | POA: Diagnosis not present

## 2022-07-04 DIAGNOSIS — Z170 Estrogen receptor positive status [ER+]: Secondary | ICD-10-CM | POA: Diagnosis not present

## 2022-07-20 ENCOUNTER — Ambulatory Visit: Payer: Medicare HMO | Admitting: Nurse Practitioner

## 2022-07-24 DIAGNOSIS — Z170 Estrogen receptor positive status [ER+]: Secondary | ICD-10-CM | POA: Diagnosis not present

## 2022-07-24 DIAGNOSIS — C7A8 Other malignant neuroendocrine tumors: Secondary | ICD-10-CM | POA: Diagnosis not present

## 2022-07-24 DIAGNOSIS — Z79811 Long term (current) use of aromatase inhibitors: Secondary | ICD-10-CM | POA: Diagnosis not present

## 2022-07-24 DIAGNOSIS — R970 Elevated carcinoembryonic antigen [CEA]: Secondary | ICD-10-CM | POA: Diagnosis not present

## 2022-07-24 DIAGNOSIS — Z905 Acquired absence of kidney: Secondary | ICD-10-CM | POA: Diagnosis not present

## 2022-07-24 DIAGNOSIS — R590 Localized enlarged lymph nodes: Secondary | ICD-10-CM | POA: Diagnosis not present

## 2022-07-24 DIAGNOSIS — C50912 Malignant neoplasm of unspecified site of left female breast: Secondary | ICD-10-CM | POA: Diagnosis not present

## 2022-07-24 DIAGNOSIS — C50212 Malignant neoplasm of upper-inner quadrant of left female breast: Secondary | ICD-10-CM | POA: Diagnosis not present

## 2022-07-24 DIAGNOSIS — C7B8 Other secondary neuroendocrine tumors: Secondary | ICD-10-CM | POA: Diagnosis not present

## 2022-08-18 ENCOUNTER — Encounter: Payer: Self-pay | Admitting: Nurse Practitioner

## 2022-08-18 ENCOUNTER — Ambulatory Visit (INDEPENDENT_AMBULATORY_CARE_PROVIDER_SITE_OTHER): Payer: Medicare HMO | Admitting: Nurse Practitioner

## 2022-08-18 VITALS — BP 126/62 | HR 68 | Temp 96.1°F | Ht 65.0 in | Wt 189.4 lb

## 2022-08-18 DIAGNOSIS — R7303 Prediabetes: Secondary | ICD-10-CM

## 2022-08-18 DIAGNOSIS — I10 Essential (primary) hypertension: Secondary | ICD-10-CM

## 2022-08-18 LAB — POCT GLYCOSYLATED HEMOGLOBIN (HGB A1C): Hemoglobin A1C: 5.8 % — AB (ref 4.0–5.6)

## 2022-08-18 NOTE — Assessment & Plan Note (Addendum)
BP at goal BP Readings from Last 3 Encounters:  08/18/22 126/62  04/28/22 122/62  04/20/22 120/78   Maintain amlodipine, losartan doses F/up in 42month

## 2022-08-18 NOTE — Progress Notes (Signed)
Established Patient Visit  Patient: Heidi [name]   DOB: [birth year]   76 y.o. Female  MRN: [id number] Visit Date: 08/18/2022  Subjective:    Chief Complaint  Patient presents with   Office Visit    HTN/ Prediabetes Pt not fasting  Doesn't check BP  Hasn't seen nutritionist due to insurance delay   HPI Essential hypertension BP at goal BP Readings from Last 3 Encounters:  08/18/22 126/62  04/28/22 122/62  04/20/22 120/78   Maintain amlodipine, losartan doses F/up in 35month  Prediabetes Repeat hgbA1c at 5.8% Continue diet modification and exercise. F/up in 372month  Reviewed medical, surgical, and social history today  Medications: Outpatient Medications Prior to Visit  Medication Sig   ALPHA LIPOIC ACID PO Take by mouth at bedtime.   amLODipine (NORVASC) 10 MG tablet Take 1 tablet (10 mg total) by mouth at bedtime.   anastrozole (ARIMIDEX) 1 MG tablet Take 1 mg by mouth daily.   Ascorbic Acid (SM CHEWABLE VITAMIN C) 500 MG CHEW Chew 500 mg by mouth daily.   aspirin 81 MG tablet Take 81 mg by mouth at bedtime.   Biotin 10 MG TABS Take by mouth daily.   cetirizine (ZYRTEC) 5 MG tablet Take 1 tablet (5 mg total) by mouth at bedtime.   cholecalciferol (VITAMIN D) 1000 units tablet Take 2 tablets daily.   Coenzyme Q10 (COQ10) 100 MG CAPS Take by mouth at bedtime.   fluticasone (FLONASE) 50 MCG/ACT nasal spray Place 2 sprays into both nostrils daily.   losartan (COZAAR) 100 MG tablet Take 1 tablet (100 mg total) by mouth daily.   NON FORMULARY CBD Gummy 10 mg nighty   pravastatin (PRAVACHOL) 40 MG tablet TAKE 1/2 TABLET ('20MG'$  TOTAL) DAILY.   Probiotic Product (PROBIOTIC PO) 1 tablet.   [DISCONTINUED] letrozole (FEMARA) 2.5 MG tablet Take 2.5 mg by mouth daily.   No facility-administered medications prior to visit.   Reviewed past medical and social history.   ROS per HPI above  Last CBC Lab Results  Component Value Date   WBC 7.2 12/09/2019    HGB 13.4 12/09/2019   HCT 41 12/09/2019   MCV 89.9 11/27/2017   RDW 14.9 11/27/2017   PLT 303 0240/98/1191 Last metabolic panel Lab Results  Component Value Date   GLUCOSE 146 (H) 11/24/2020   NA 138 11/24/2020   K 4.0 11/24/2020   CL 100 11/24/2020   CO2 30 11/24/2020   BUN 12 11/24/2020   CREATININE 0.90 11/24/2020   GFRNONAA 53 (L) 11/27/2017   CALCIUM 10.0 11/24/2020   PROT 7.1 05/13/2019   ALBUMIN 4.4 12/09/2019   BILITOT 0.7 05/13/2019   ALKPHOS 63 05/13/2019   AST 14 12/09/2019   ALT 10 12/09/2019   Last lipids Lab Results  Component Value Date   CHOL 154 04/21/2022   HDL 49.20 04/21/2022   LDLCALC 86 04/21/2022   LDLDIRECT 149.6 07/08/2007   TRIG 96.0 04/21/2022   CHOLHDL 3 04/21/2022   Last hemoglobin A1c Lab Results  Component Value Date   HGBA1C 5.8 (A) 08/18/2022      Objective:  BP 126/62 (BP Location: Right Arm, Patient Position: Sitting, Cuff Size: Normal)   Pulse 68   Temp (!) 96.1 F (35.6 C) (Temporal)   Ht '5\' 5"'$  (1.651 m)   Wt 189 lb 6.4 oz (85.9 kg)   LMP 10/23/1994 (Approximate)   SpO2 95%  BMI 31.52 kg/m      Physical Exam Vitals reviewed.  Cardiovascular:     Rate and Rhythm: Normal rate and regular rhythm.     Pulses: Normal pulses.     Heart sounds: Normal heart sounds.  Pulmonary:     Effort: Pulmonary effort is normal.     Breath sounds: Normal breath sounds.  Musculoskeletal:     Right lower leg: No edema.     Left lower leg: No edema.  Neurological:     Mental Status: She is alert.     Results for orders placed or performed in visit on 08/18/22  POCT glycosylated hemoglobin (Hb A1C)  Result Value Ref Range   Hemoglobin A1C 5.8 (A) 4.0 - 5.6 %      Assessment & Plan:    Problem List Items Addressed This Visit       Cardiovascular and Mediastinum   Essential hypertension    BP at goal BP Readings from Last 3 Encounters:  08/18/22 126/62  04/28/22 122/62  04/20/22 120/78   Maintain amlodipine,  losartan doses F/up in 86month        Other   Prediabetes - Primary    Repeat hgbA1c at 5.8% Continue diet modification and exercise. F/up in 329month     Relevant Orders   POCT glycosylated hemoglobin (Hb A1C) (Completed)   Return in about 3 months (around 11/18/2022) for HTN, prediabetes, hyperlipidemia (fasting).     ChWilfred LacyNP

## 2022-08-18 NOTE — Patient Instructions (Addendum)
hgbA1c 6.1 to 5.8% improved Ok to attend [place] diabetes program

## 2022-08-18 NOTE — Assessment & Plan Note (Signed)
Repeat hgbA1c at 5.8% Continue diet modification and exercise. F/up in 25month

## 2022-08-28 ENCOUNTER — Telehealth: Payer: Self-pay

## 2022-08-28 NOTE — Progress Notes (Signed)
    Chronic Care Management Pharmacy Assistant   Name: Heidi [name]  MRN: [id number] DOB: 08/15/1946  Reason for Encounter: Medication Review/General Adherence Call.   Recent office visits:  08/18/2022 Wilfred Lacy NP (PCP)  No medication Changes noted,Return in about 3 months  04/20/2022 Wilfred Lacy NP (PCP) No medication Changes noted,Referral to Nutrition and Diabetes Services ,. Return in about 3 months   Recent consult visits:   07/24/2022 Dr.Paluri MD (Hematology and Oncology) unable to see note 07/04/2022 Josie Dixon (Hematology and Oncology) unable to see note 04/28/2022 Dr. Talbert Nan MD (Gynecology) No Medication Changes noted,  Return in about 1 year 04/24/2022 Dr.Paluri MD (Hematology and Oncology) No medication Changes noted,   Hospital visits:  None in previous 6 months  Medications: Outpatient Encounter Medications as of 08/28/2022  Medication Sig   ALPHA LIPOIC ACID PO Take by mouth at bedtime.   amLODipine (NORVASC) 10 MG tablet Take 1 tablet (10 mg total) by mouth at bedtime.   anastrozole (ARIMIDEX) 1 MG tablet Take 1 mg by mouth daily.   Ascorbic Acid (SM CHEWABLE VITAMIN C) 500 MG CHEW Chew 500 mg by mouth daily.   aspirin 81 MG tablet Take 81 mg by mouth at bedtime.   Biotin 10 MG TABS Take by mouth daily.   cetirizine (ZYRTEC) 5 MG tablet Take 1 tablet (5 mg total) by mouth at bedtime.   cholecalciferol (VITAMIN D) 1000 units tablet Take 2 tablets daily.   Coenzyme Q10 (COQ10) 100 MG CAPS Take by mouth at bedtime.   fluticasone (FLONASE) 50 MCG/ACT nasal spray Place 2 sprays into both nostrils daily.   losartan (COZAAR) 100 MG tablet Take 1 tablet (100 mg total) by mouth daily.   NON FORMULARY CBD Gummy 10 mg nighty   pravastatin (PRAVACHOL) 40 MG tablet TAKE 1/2 TABLET ('20MG'$  TOTAL) DAILY.   Probiotic Product (PROBIOTIC PO) 1 tablet.   No facility-administered encounter medications on file as of 08/28/2022.    Care Gaps: None ID  Star Rating  Drugs: Losartan 100 mg last filled 07/27/2022 90 day supply at [facility] Pravastatin 40 mg last filled 07/27/2022 90 day supply at [place] patient and discussed medication adherence  with patient, no issues at this time with current medication.   Patient Denies ED visit since her last CPP follow up.  Patient Denies  any side effects with her medication. Patient Denies  any problems with hercurrent pharmacy  Patient states she is doing good, and had a follow up with her PCP last month.  [place] Pharmacist Assistant [phone]

## 2022-09-06 DIAGNOSIS — C50119 Malignant neoplasm of central portion of unspecified female breast: Secondary | ICD-10-CM | POA: Diagnosis not present

## 2022-09-06 DIAGNOSIS — Z08 Encounter for follow-up examination after completed treatment for malignant neoplasm: Secondary | ICD-10-CM | POA: Diagnosis not present

## 2022-09-06 DIAGNOSIS — C50212 Malignant neoplasm of upper-inner quadrant of left female breast: Secondary | ICD-10-CM | POA: Diagnosis not present

## 2022-09-06 DIAGNOSIS — Z170 Estrogen receptor positive status [ER+]: Secondary | ICD-10-CM | POA: Diagnosis not present

## 2022-09-06 DIAGNOSIS — Z853 Personal history of malignant neoplasm of breast: Secondary | ICD-10-CM | POA: Diagnosis not present

## 2022-09-06 DIAGNOSIS — Z79811 Long term (current) use of aromatase inhibitors: Secondary | ICD-10-CM | POA: Diagnosis not present

## 2022-09-06 DIAGNOSIS — Z923 Personal history of irradiation: Secondary | ICD-10-CM | POA: Diagnosis not present

## 2022-09-06 LAB — HM MAMMOGRAPHY

## 2022-09-29 DIAGNOSIS — Z853 Personal history of malignant neoplasm of breast: Secondary | ICD-10-CM | POA: Diagnosis not present

## 2022-09-29 DIAGNOSIS — Z9012 Acquired absence of left breast and nipple: Secondary | ICD-10-CM | POA: Diagnosis not present

## 2022-09-29 DIAGNOSIS — C649 Malignant neoplasm of unspecified kidney, except renal pelvis: Secondary | ICD-10-CM | POA: Diagnosis not present

## 2022-09-29 DIAGNOSIS — C50912 Malignant neoplasm of unspecified site of left female breast: Secondary | ICD-10-CM | POA: Diagnosis not present

## 2022-09-29 DIAGNOSIS — C7A8 Other malignant neuroendocrine tumors: Secondary | ICD-10-CM | POA: Diagnosis not present

## 2022-09-29 DIAGNOSIS — Z170 Estrogen receptor positive status [ER+]: Secondary | ICD-10-CM | POA: Diagnosis not present

## 2022-09-29 DIAGNOSIS — Z923 Personal history of irradiation: Secondary | ICD-10-CM | POA: Diagnosis not present

## 2022-09-29 DIAGNOSIS — C7B8 Other secondary neuroendocrine tumors: Secondary | ICD-10-CM | POA: Diagnosis not present

## 2022-09-29 DIAGNOSIS — Z08 Encounter for follow-up examination after completed treatment for malignant neoplasm: Secondary | ICD-10-CM | POA: Diagnosis not present

## 2022-09-29 DIAGNOSIS — Z87891 Personal history of nicotine dependence: Secondary | ICD-10-CM | POA: Diagnosis not present

## 2022-10-12 ENCOUNTER — Telehealth: Payer: Medicare HMO

## 2022-10-19 ENCOUNTER — Ambulatory Visit (INDEPENDENT_AMBULATORY_CARE_PROVIDER_SITE_OTHER): Payer: Medicare HMO

## 2022-10-19 DIAGNOSIS — R7303 Prediabetes: Secondary | ICD-10-CM

## 2022-10-19 DIAGNOSIS — I10 Essential (primary) hypertension: Secondary | ICD-10-CM

## 2022-10-19 DIAGNOSIS — E782 Mixed hyperlipidemia: Secondary | ICD-10-CM

## 2022-10-19 NOTE — Progress Notes (Signed)
Chronic Care Management Pharmacy Note  10/19/2022 Name:  Heidi [name] MRN:  [id number] DOB:  June 27, 1946  Summary: Patient presents for CCM follow-up. Patient was concerned about the potential of type 2 diabetes. Patient active most days walking or weight lifting. She is working on a prediabetes program starting in January   -she continues to have hot flashes with anastrozole, but does feel her joint pain has improved. She also notes intermittent chest burning but is unsure if this is heartburn.   Recommendations/Changes made from today's visit: Continue Current Medications Educated on A1c and blood sugar goals; Exercise goal of 150 minutes per week; Benefits of weight loss; and Carbohydrate counting and/or plate method  Plan: CPP follow-up in 6 months   Subjective: Heidi [name] is an 76 y.o. year old female who is a primary patient of Nche, Charlene Brooke, NP.  The CCM team was consulted for assistance with disease management and care coordination needs.    Engaged with patient by telephone for follow up visit in response to provider referral for pharmacy case management and/or care coordination services.   Consent to Services:  The patient was given information about Chronic Care Management services, agreed to services, and gave verbal consent prior to initiation of services.  Please see initial visit note for detailed documentation.   Patient Care Team: Nche, Charlene Brooke, NP as PCP - General (Internal Medicine) Germaine Pomfret, [facility] as Pharmacist (Pharmacist) Salvadore Dom, MD as Consulting Physician (Obstetrics and Gynecology)  Recent office visits: 08/18/22: Patient presented to Wilfred Lacy, NP for follow-up.   Recent consult visits: 09/29/22: Patient presented to Docia Chuck, NP (Oncology)  07/04/22: Patient presented to Dr. Nathaneil Canary (oncology) for follow-up. Letrozole stopped, anastrozole started.    Objective:  Lab Results  Component Value Date    CREATININE 0.90 11/24/2020   BUN 12 11/24/2020   GFR 63.38 11/24/2020   GFRNONAA 53 (L) 11/27/2017   GFRAA 61 11/27/2017   NA 138 11/24/2020   K 4.0 11/24/2020   CALCIUM 10.0 11/24/2020   CO2 30 11/24/2020    Lab Results  Component Value Date/Time   HGBA1C 5.8 (A) 08/18/2022 01:27 PM   HGBA1C 5.7 02/26/2020 08:34 AM   GFR 63.38 11/24/2020 10:44 AM   GFR 67.49 05/13/2019 10:55 AM    Last diabetic Eye exam: No results found for: "HMDIABEYEEXA"  Last diabetic Foot exam: No results found for: "HMDIABFOOTEX"   Lab Results  Component Value Date   CHOL 154 04/21/2022   HDL 49.20 04/21/2022   LDLCALC 86 04/21/2022   LDLDIRECT 149.6 07/08/2007   TRIG 96.0 04/21/2022   CHOLHDL 3 04/21/2022       Latest Ref Rng & Units 12/09/2019   12:00 AM 05/13/2019   10:55 AM 11/06/2018   10:08 AM  Hepatic Function  Total Protein 6.0 - 8.3 g/dL  7.1  7.7   Albumin 3.5 - 5.0 4.4     4.3  4.6   AST 13 - 35 _0 ALT 7 - 35 10     13  47   Alk Phosphatase 39 - 117 U/L  63  61   Total Bilirubin 0.2 - 1.2 mg/dL  0.7  0.6   Bilirubin, Direct 0.0 - 0.3 mg/dL  0.1       This result is from an external source.    Lab Results  Component Value Date/Time   TSH 1.49 11/24/2020 10:44 AM  TSH 1.92 07/21/2019 08:06 AM   FREET4 0.76 07/21/2019 08:06 AM       Latest Ref Rng & Units 12/09/2019   12:00 AM 11/27/2017   11:43 AM 12/11/2016   10:54 AM  CBC  WBC  7.2     5.8  6.5   Hemoglobin 12.0 - 16.0 13.4     13.3  13.9   Hematocrit 36 - 46 41     40.2  41.8   Platelets 150 - 399 303     342.0  316.0      This result is from an external source.    Lab Results  Component Value Date/Time   VD25OH 29.49 (L) 11/27/2017 11:43 AM   VD25OH 39.16 12/11/2016 10:54 AM    Clinical ASCVD: No  The 10-year ASCVD risk score (Arnett DK, et al., 2019) is: 11.6%   Values used to calculate the score:     Age: 64 years     Sex: Female     Is Non-Hispanic African American: Yes     Diabetic: No      Tobacco smoker: No     Systolic Blood Pressure: 010 mmHg     Is BP treated: Yes     HDL Cholesterol: 49.2 mg/dL     Total Cholesterol: 154 mg/dL       12/07/2021    1:37 PM 11/23/2020    2:21 PM 11/19/2019   10:14 AM  Depression screen PHQ 2/9  Decreased Interest 0 0 0  Down, Depressed, Hopeless 0 0 0  PHQ - 2 Score 0 0 0     Social History   Tobacco Use  Smoking Status Former   Packs/day: 0.25   Years: 10.00   Total pack years: 2.50   Types: Cigarettes   Quit date: 10/24/1983   Years since quitting: 39.0  Smokeless Tobacco Never   BP Readings from Last 3 Encounters:  08/18/22 126/62  04/28/22 122/62  04/20/22 120/78   Pulse Readings from Last 3 Encounters:  08/18/22 68  04/28/22 77  04/20/22 66   Wt Readings from Last 3 Encounters:  08/18/22 189 lb 6.4 oz (85.9 kg)  04/28/22 190 lb (86.2 kg)  04/20/22 189 lb 12.8 oz (86.1 kg)    Assessment/Interventions: Review of patient past medical history, allergies, medications, health status, including review of consultants reports, laboratory and other test data, was performed as part of comprehensive evaluation and provision of chronic care management services.   SDOH:  (Social Determinants of Health) assessments and interventions performed: Yes SDOH Interventions    Flowsheet Row Clinical Support from 12/07/2021 in [place] Management from 05/31/2021 in [place] Management from 11/30/2020 in [place] Management from 09/28/2020 in [place] Management from 07/30/2020 in [place] Interventions Intervention Not Indicated -- -- -- --  Housing Interventions Intervention Not Indicated -- -- -- --  Transportation Interventions Intervention Not Indicated -- Intervention Not Indicated Intervention Not Indicated Intervention  Not Indicated  Financial Strain Interventions Intervention Not Indicated Intervention Not Indicated Intervention Not Indicated Intervention Not Indicated Other (Comment)  [Will start PAP]  Physical Activity Interventions Intervention Not Indicated -- -- -- --  Stress Interventions Intervention Not Indicated -- -- -- --  Social Connections Interventions Intervention Not Indicated -- -- -- --  CCM Care Plan  No Known Allergies  Medications Reviewed Today     Reviewed by Lucillie Garfinkel, CMA (Certified Medical Assistant) on 08/18/22 at 42  Med List Status: <None>   Medication Order Taking? Sig Documenting Provider Last Dose Status Informant  ALPHA LIPOIC ACID PO [id number] Yes Take by mouth at bedtime. [provider] Taking Active   amLODipine (NORVASC) 10 MG tablet [id number] Yes Take 1 tablet (10 mg total) by mouth at bedtime. Nche, Charlene Brooke, NP Taking Active   anastrozole (ARIMIDEX) 1 MG tablet [id number] Yes Take 1 mg by mouth daily. [provider] Taking Active   Ascorbic Acid (SM CHEWABLE VITAMIN C) 500 MG CHEW [id number] Yes Chew 500 mg by mouth daily. [provider] Taking Active   aspirin 81 MG tablet [id number] Yes Take 81 mg by mouth at bedtime. [provider] Taking Active   Biotin 10 MG TABS [id number] Yes Take by mouth daily. [provider] Taking Active   cetirizine (ZYRTEC) 5 MG tablet [id number] Yes Take 1 tablet (5 mg total) by mouth at bedtime. Flossie Buffy, NP Taking Active   cholecalciferol (VITAMIN D) 1000 units tablet [id number] Yes Take 2 tablets daily. Noralee Space, MD Taking Active   Coenzyme Q10 (COQ10) 100 MG CAPS [id number] Yes Take by mouth at bedtime. [provider] Taking Active   fluticasone (FLONASE) 50 MCG/ACT nasal spray [id number] Yes Place 2 sprays into both nostrils daily. Nche, Charlene Brooke, NP Taking Active   losartan (COZAAR) 100 MG tablet [id number] Yes Take 1 tablet (100 mg total) by  mouth daily. Flossie Buffy, NP Taking Active   NON FORMULARY [id number] Yes CBD Gummy 10 mg nighty [provider] Taking Active   pravastatin (PRAVACHOL) 40 MG tablet [id number] Yes TAKE 1/2 TABLET (20MG TOTAL) DAILY. Flossie Buffy, NP Taking Active   Probiotic Product (PROBIOTIC PO) [id number] Yes 1 tablet. 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   Heel spur 04/19/2015   ACUTE CYSTITIS 01/11/2008   Osteoarthritis 01/11/2008   Hyperlipidemia 01/02/2008   Essential hypertension 01/02/2008   Venous (peripheral) insufficiency 01/02/2008   IRRITABLE BOWEL SYNDROME 01/02/2008    Immunization History  Administered Date(s) Administered   Fluad Quad(high Dose 65+) 06/19/2019   Influenza Split 10/14/2009, 10/12/2010, 10/12/2011, 10/11/2012   Influenza Whole 10/14/2009, 10/12/2010   Influenza, High Dose Seasonal PF 06/19/2019, 06/22/2022   Influenza,inj,Quad PF,6+ Mos 08/27/2013, 10/14/2014, 11/24/2015, 10/02/2016   Influenza-Unspecified 10/12/2011, 10/11/2012, 07/23/2018, 06/19/2019, 06/24/2020, 07/12/2021   Moderna Sars-Covid-2 Vaccination 11/23/2019, 12/21/2019, 08/17/2020, 01/22/2021, 03/06/2022   PNEUMOCOCCAL CONJUGATE-20 11/04/2021   Pfizer Covid-19 Vaccine Bivalent Booster 53yr & up 07/12/2021   Pneumococcal Conjugate-13 12/11/2016   Pneumococcal Polysaccharide-23 04/11/2012, 08/17/2020   Td 11/23/2010   Td,absorbed, Preservative Free, Adult Use, Lf Unspecified 11/23/2010   Tdap 03/08/2021   Unspecified SARS-COV-2 Vaccination 07/19/2022   Zoster Recombinat (Shingrix) 07/20/2021, 10/26/2021    Conditions to be addressed/monitored:  Hypertension,  Hyperlipidemia, GERD, Anxiety, Allergic Rhinitis and Irritable Bowel Syndrome and History of Carcinoma    Care Plan : General Pharmacy (Adult)  Updates made by FGermaine Pomfret R[place]eca Knoll[place] 10/19/2022 12:00 AM  Problem: Hypertension, Hyperlipidemia, GERD, Anxiety, Allergic Rhinitis and Irritable Bowel Syndrome and History of Carcinoma   Priority: High     Long-Range Goal: Patient-Specific Goal   Start Date: 11/30/2020  Expected End Date: 04/14/2023  This Visit's Progress: On track  Recent Progress: On track  Priority: High  Note:   Current Barriers:  No barriers currently noted  Pharmacist Clinical Goal(s):  Over the next 90 days, patient will achieve control of Blood pressure as evidenced by BP < 140/90 through collaboration with PharmD and provider.   Interventions: 1:1 collaboration with Nche, Charlene Brooke, NP regarding development and update of comprehensive plan of care as evidenced by provider attestation and co-signature Inter-disciplinary care team collaboration (see longitudinal plan of care) Comprehensive medication review performed; medication list updated in electronic medical record  Hypertension (BP goal <140/90) -controlled -Current treatment: Amlodipine 10 mg at bedtime  Losartan 100 mg daily  -Medications previously tried: NA  -Current home readings:   -Current dietary habits: Trying to eat plenty of fruits and vegetables. Drinking plenty of water.  -Current exercise habits: Walking daily, exercise routines 3x daily  (low-impact, weights)  -Denies hypotensive/hypertensive symptoms.  -Recommended to continue current medication  Hyperlipidemia: (LDL goal < 100) -controlled -Current treatment: Pravastatin 40 mg 1/2 tablet daily  -Medications previously tried: NA  -Recommended to continue current medication  Prediabetes (A1c goal <6.5%) -Controlled -Current medications: None -Medications previously tried: NA  -Current exercise: Patient active most  days walking or weight lifting. She is working on a prediabetes program starting in January   -Educated on A1c and blood sugar goals; Exercise goal of 150 minutes per week; Benefits of weight loss; Carbohydrate counting and/or plate method -Counseled on diet and exercise extensively  Breast Cancer (Goal: Prolong Survival) -Controlled -Current treatment  Anastrozole 1 mg daily  -Medications previously tried: Letrozole (hot flashes) .  -Letrozole started Nov 2022, switched to anastrozole in Sep 2023, plan for 5-10 years of treatment.  -she continues to have hot flashes with anastrozole, but does feel her joint pain has improved. She also notes intermittent chest burning but is unsure if this is heartburn.  -Recommended to continue current medication  Patient Goals/Self-Care Activities Over the next 90 days, patient will:  - take medications as prescribed check blood pressure Weekly, document, and provide at future appointments  Follow Up Plan: Telephone follow up appointment with care management team member scheduled for:  04/19/2023 at 8:30 AM     Medication Assistance: None required.  Patient affirms current coverage meets needs.  Patient's preferred pharmacy is:  [facility], [place] [facility] [address] [place] [address] Phone: [phone] Fax: [place], [address] [place] [address] Phone: [phone] Fax: [phone]   Uses pill box? Yes Pt endorses 100% compliance  We discussed: Current pharmacy is preferred with insurance plan and patient is satisfied with pharmacy services Patient decided to: Continue current medication management strategy  Follow Up:  Patient agrees to Care Plan and Follow-up.  Plan: Telephone follow up appointment with care management team member scheduled  for:  04/19/2023 at 8:30 AM  Junius Argyle, PharmD, Para [place], CPP Clinical Pharmacist Practitioner  [facility] at [facility]  [phone]

## 2022-10-19 NOTE — Patient Instructions (Signed)
Visit Information It was great speaking with you today!  Please let me know if you have any questions about our visit.   Goals Addressed             This Visit's Progress    Track and Manage My Blood Pressure-Hypertension   On track    Timeframe:  Long-Range Goal Priority:  High Start Date:   11/30/2020                          Expected End Date:  06/01/2023                    Follow Up within 90 days   - check blood pressure daily    Why is this important?   You won't feel high blood pressure, but it can still hurt your blood vessels.  High blood pressure can cause heart or kidney problems. It can also cause a stroke.  Making lifestyle changes like losing a little weight or eating less salt will help.  Checking your blood pressure at home and at different times of the day can help to control blood pressure.  If the doctor prescribes medicine remember to take it the way the doctor ordered.  Call the office if you cannot afford the medicine or if there are questions about it.     Notes:         Patient Care Plan: General Pharmacy (Adult)     Problem Identified: Hypertension, Hyperlipidemia, GERD, Anxiety, Allergic Rhinitis and Irritable Bowel Syndrome and History of Carcinoma   Priority: High     Long-Range Goal: Patient-Specific Goal   Start Date: 11/30/2020  Expected End Date: 04/14/2023  This Visit's Progress: On track  Recent Progress: On track  Priority: High  Note:   Current Barriers:  No barriers currently noted  Pharmacist Clinical Goal(s):  Over the next 90 days, patient will achieve control of Blood pressure as evidenced by BP < 140/90 through collaboration with PharmD and provider.   Interventions: 1:1 collaboration with Nche, Charlene Brooke, NP regarding development and update of comprehensive plan of care as evidenced by provider attestation and co-signature Inter-disciplinary care team collaboration (see longitudinal plan of care) Comprehensive medication  review performed; medication list updated in electronic medical record  Hypertension (BP goal <140/90) -controlled -Current treatment: Amlodipine 10 mg at bedtime  Losartan 100 mg daily  -Medications previously tried: NA  -Current home readings:   -Current dietary habits: Trying to eat plenty of fruits and vegetables. Drinking plenty of water.  -Current exercise habits: Walking daily, exercise routines 3x daily  (low-impact, weights)  -Denies hypotensive/hypertensive symptoms.  -Recommended to continue current medication  Hyperlipidemia: (LDL goal < 100) -controlled -Current treatment: Pravastatin 40 mg 1/2 tablet daily  -Medications previously tried: NA  -Recommended to continue current medication  Prediabetes (A1c goal <6.5%) -Controlled -Current medications: None -Medications previously tried: NA  -Current exercise: Patient active most days walking or weight lifting. She is working on a prediabetes program starting in January   -Educated on A1c and blood sugar goals; Exercise goal of 150 minutes per week; Benefits of weight loss; Carbohydrate counting and/or plate method -Counseled on diet and exercise extensively  Breast Cancer (Goal: Prolong Survival) -Controlled -Current treatment  Anastrozole 1 mg daily  -Medications previously tried: Letrozole (hot flashes) .  -Letrozole started Nov 2022, switched to anastrozole in Sep 2023, plan for 5-10 years of treatment.  -she continues to have  hot flashes with anastrozole, but does feel her joint pain has improved. She also notes intermittent chest burning but is unsure if this is heartburn.  -Recommended to continue current medication  Patient Goals/Self-Care Activities Over the next 90 days, patient will:  - take medications as prescribed check blood pressure Weekly, document, and provide at future appointments  Follow Up Plan: Telephone follow up appointment with care management team member scheduled for:  04/19/2023 at  8:30 AM    Patient agreed to services and verbal consent obtained.   Patient verbalizes understanding of instructions and care plan provided today and agrees to view in [place]. Active MyChart status and patient understanding of how to access instructions and care plan via MyChart confirmed with patient.     Junius Argyle, PharmD, Para [place], CPP Clinical Pharmacist Practitioner  [facility] at [facility]  [phone]

## 2022-10-22 DIAGNOSIS — E782 Mixed hyperlipidemia: Secondary | ICD-10-CM

## 2022-10-22 DIAGNOSIS — I10 Essential (primary) hypertension: Secondary | ICD-10-CM

## 2022-11-16 ENCOUNTER — Encounter: Payer: Medicare HMO | Admitting: Registered"

## 2022-11-16 ENCOUNTER — Encounter: Payer: Self-pay | Admitting: Dietician

## 2022-11-16 ENCOUNTER — Encounter: Payer: Medicare HMO | Attending: Nurse Practitioner | Admitting: Dietician

## 2022-11-16 DIAGNOSIS — R7303 Prediabetes: Secondary | ICD-10-CM | POA: Diagnosis not present

## 2022-11-16 NOTE — Progress Notes (Signed)
On 11/16/2022 patient completed Core Session 1 of Diabetes Prevention Program course virtually with [facility]. The following learning objectives were met by the patient during this class:   Virtual Visit via Video Note  I connected with Catlynn V. Bunn  by a video enabled application and verified that I am speaking with the correct person using two identifiers.   I discussed the limitations of evaluation and management by telemedicine and the availability of in person appointments. The patient expressed understanding and agreed to proceed.  Location: Patient: home Provider: office   Learning Objectives:  Be able to explain the purpose and benefits of the National Diabetes Prevention Program.  Be able to describe the events that will take place at every session.  Know the weight loss and physical activity goals established by the [facility] Diabetes Prevention Program.  Know their own individual weight loss and physical activity goals.  Be able to explain the important effect of self-monitoring on behavior change.   Goals:  Record food and beverage intake in "Food and Activity Tracker" over the next week.  E-mail completed "Food and Activity Tracker" to Lifestyle Coach next week before session 2. Circle the foods or beverages you think are highest in fat and calories in your food tracker. Read the labels on the food you buy, and consider using measuring cups and spoons to help you calculate the amount you eat. We will talk about measuring in more detail in the coming weeks.   Follow-Up Plan: Attend Core Session 2 next week.  E-mail completed "Food and Activity Tracker" to Lifestyle Coach next week before class.

## 2022-11-17 ENCOUNTER — Encounter (HOSPITAL_BASED_OUTPATIENT_CLINIC_OR_DEPARTMENT_OTHER): Payer: Medicare HMO | Admitting: Dietician

## 2022-11-17 ENCOUNTER — Encounter: Payer: Self-pay | Admitting: Dietician

## 2022-11-17 DIAGNOSIS — R7303 Prediabetes: Secondary | ICD-10-CM

## 2022-11-17 NOTE — Progress Notes (Signed)
Patient was seen on 11/17/2022 for the Core Session 2 of Diabetes Prevention Program course at [facility]. By the end of this session patients are able to complete the following objectives:   Learning Objectives: Self-monitor their weight during the weeks following Session 2.  Describe the relationship between fat and calories.  Explain the reason for, and basic principles of, self-monitoring fat grams and calories.  Identify their personal fat gram goals.  Use the ?Fat and Calorie Counter? to calculate the calories and fat grams of a given selection of foods.  Keep a running total of the fat grams they eat each day.  Calculate fat, calories, and serving sizes from nutrition labels.   Goals:  Weigh yourself at the same time each day, or every few days, and record your weight in your Food and Activity Tracker. Write down everything you eat and drink in your Food and Activity Tracker. Measure portions as much as you can, and start reading labels.  Use the ?Fat and Calorie Counter? to figure out the amount of fat and calories in what you ate, and write the amount down in your Food and Activity Tracker. Keep a running fat gram total throughout the day. Come as close to your fat gram goal as you can.   Follow-Up Plan: Attend Core Session 3 next week.  Bring completed "Food and Activity Tracker" next week to be reviewed by Lifestyle Coach.

## 2022-11-24 ENCOUNTER — Encounter: Payer: Medicare HMO | Attending: Nurse Practitioner | Admitting: Dietician

## 2022-11-24 ENCOUNTER — Encounter: Payer: Self-pay | Admitting: Dietician

## 2022-11-24 DIAGNOSIS — R7303 Prediabetes: Secondary | ICD-10-CM | POA: Insufficient documentation

## 2022-11-24 NOTE — Progress Notes (Signed)
Patient was seen on 11/24/2022 for the Core Session 3 of Diabetes Prevention Program course at [facility]. By the end of this session patients are able to complete the following objectives:   Learning Objectives: Weigh and measure foods. Estimate the fat and calorie content of common foods. Describe three ways to eat less fat and fewer calories. Create a plan to eat less fat for the following week.   Goals:  Track weight when weighing outside of class.  Track food and beverages eaten each day in Food and Activity Tracker and include fat grams and calories for each.  Try to stay within fat gram goal.  Complete plan for eating less high fat foods and answer related homework questions.    Follow-Up Plan: Attend Core Session 4 next week.  Bring completed "Food and Activity Tracker" next week to be reviewed by Lifestyle Coach.

## 2022-11-28 ENCOUNTER — Encounter: Payer: Self-pay | Admitting: Nurse Practitioner

## 2022-11-28 ENCOUNTER — Ambulatory Visit (INDEPENDENT_AMBULATORY_CARE_PROVIDER_SITE_OTHER): Payer: Medicare HMO | Admitting: Nurse Practitioner

## 2022-11-28 VITALS — BP 120/70 | HR 67 | Temp 97.8°F | Resp 16 | Ht 65.5 in | Wt 184.0 lb

## 2022-11-28 DIAGNOSIS — R7303 Prediabetes: Secondary | ICD-10-CM | POA: Diagnosis not present

## 2022-11-28 DIAGNOSIS — C7B8 Other secondary neuroendocrine tumors: Secondary | ICD-10-CM | POA: Diagnosis not present

## 2022-11-28 DIAGNOSIS — I10 Essential (primary) hypertension: Secondary | ICD-10-CM

## 2022-11-28 DIAGNOSIS — C7A8 Other malignant neuroendocrine tumors: Secondary | ICD-10-CM | POA: Diagnosis not present

## 2022-11-28 DIAGNOSIS — E782 Mixed hyperlipidemia: Secondary | ICD-10-CM | POA: Diagnosis not present

## 2022-11-28 NOTE — Patient Instructions (Addendum)
Maintain current medications Schedule fasting lab appt.

## 2022-11-28 NOTE — Progress Notes (Signed)
Established Patient Visit  Patient: Heidi [name]   DOB: 1946-07-20   77 y.o. 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 Followed by Dr. Ovid Curd (radiology oncology) and Dr. Dorene Grebe (oncology) at [facility] every 61month.  mammogram 08/2022 (No mammographic evidence of malignancy) PET scan 07/2022 (No findings to suggest recurrent or new metastatic neuroendocrine disease. Postsurgical changes of left breast lumpectomy with increased size of multiple left axillary lymph nodes. This is nonspecific though recommend correlation with ultrasound and/or mammography for further evaluation.) Dexa scan 01/2022: normal  Essential hypertension BP at goal with losartan and amlodipine BP Readings from Last 3 Encounters:  11/28/22 120/70  08/18/22 126/62  04/28/22 122/62    Repeat BMP Maintain med dose  Prediabetes Repeat hgbA1c Encourage to continue appts with nutritionist and daily exercise  Hyperlipidemia Repeat lipid panel Maintain pravastatin dose  BP Readings from Last 3 Encounters:  11/28/22 120/70  08/18/22 126/62  04/28/22 122/62    Wt Readings from Last 3 Encounters:  11/28/22 184 lb (83.5 kg)  08/18/22 189 lb 6.4 oz (85.9 kg)  04/28/22 190 lb (86.2 kg)    Reviewed medical, surgical, and social history today  Medications: Outpatient Medications Prior to Visit  Medication Sig   ALPHA LIPOIC ACID PO Take by mouth at bedtime.   amLODipine (NORVASC) 10 MG tablet Take 1 tablet (10 mg total) by mouth at bedtime.   anastrozole (ARIMIDEX) 1 MG tablet Take 1 mg by mouth daily.   Ascorbic Acid (SM CHEWABLE VITAMIN C) 500 MG CHEW Chew 500 mg by mouth  daily.   aspirin 81 MG tablet Take 81 mg by mouth at bedtime.   Biotin 10 MG TABS Take by mouth daily.   cetirizine (ZYRTEC) 5 MG tablet Take 1 tablet (5 mg total) by mouth at bedtime.   cholecalciferol (VITAMIN D) 1000 units tablet Take 2 tablets daily.   Coenzyme Q10 (COQ10) 100 MG CAPS Take by mouth at bedtime.   fluticasone (FLONASE) 50 MCG/ACT nasal spray Place 2 sprays into both nostrils daily.   losartan (COZAAR) 100 MG tablet Take 1 tablet (100 mg total) by mouth daily.   NON FORMULARY CBD Gummy 10 mg nighty   pravastatin (PRAVACHOL) 40 MG tablet TAKE 1/2 TABLET ('20MG'$  TOTAL) DAILY.   Probiotic Product (PROBIOTIC PO) 1 tablet.   No facility-administered medications prior to visit.   Reviewed past medical and social history.   ROS per HPI above      Objective:  BP 120/70 (BP Location: Left Arm, Patient Position: Sitting, Cuff Size: Large)   Pulse 67   Temp 97.8 F (36.6 C) (Temporal)   Resp 16   Ht 5' 5.5" (1.664 m)   Wt 184 lb (83.5 kg)   LMP 10/23/1994 (Approximate)   SpO2 99%   BMI 30.15 kg/m      Physical Exam  No results found for any visits on 11/28/22.    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 Followed by Dr. Ovid Curd (radiology oncology) and Dr. Dorene Grebe (oncology) at [facility] every 40month.  mammogram 08/2022 (No mammographic evidence of malignancy) PET scan 07/2022 (No findings to suggest recurrent or new metastatic neuroendocrine disease. Postsurgical changes  of left breast lumpectomy with increased size of multiple left axillary lymph nodes. This is nonspecific though recommend correlation with ultrasound and/or mammography for further evaluation.) Dexa scan 01/2022: normal        Other   Hyperlipidemia    Repeat lipid panel Maintain pravastatin dose      Relevant Orders   Lipid panel   Prediabetes - Primary    Repeat hgbA1c Encourage to continue appts with nutritionist and daily exercise      Relevant Orders   Hemoglobin A1c   Renal Function Panel   Return in about 6 months (around 05/29/2023) for HTN, hyperlipidemia (fasting).     CWilfred Lacy NP

## 2022-11-28 NOTE — Assessment & Plan Note (Signed)
BP at goal with losartan and amlodipine BP Readings from Last 3 Encounters:  11/28/22 120/70  08/18/22 126/62  04/28/22 122/62    Repeat BMP Maintain med dose

## 2022-11-28 NOTE — Assessment & Plan Note (Addendum)
With hx of breast cancer, s/p right mastectomy, Current use of arimidex with mild fatigue. Followed by Dr. Ovid Curd (radiology oncology) and Dr. Dorene Grebe (oncology) at [facility] every 51month.  mammogram 08/2022 (No mammographic evidence of malignancy) PET scan 07/2022 (No findings to suggest recurrent or new metastatic neuroendocrine disease. Postsurgical changes of left breast lumpectomy with increased size of multiple left axillary lymph nodes. This is nonspecific though recommend correlation with ultrasound and/or mammography for further evaluation.) Dexa scan 01/2022: normal

## 2022-11-28 NOTE — Assessment & Plan Note (Signed)
Repeat lipid panel Maintain pravastatin dose

## 2022-11-28 NOTE — Assessment & Plan Note (Signed)
Repeat hgbA1c Encourage to continue appts with nutritionist and daily exercise

## 2022-11-30 ENCOUNTER — Other Ambulatory Visit (INDEPENDENT_AMBULATORY_CARE_PROVIDER_SITE_OTHER): Payer: Medicare HMO

## 2022-11-30 DIAGNOSIS — R7303 Prediabetes: Secondary | ICD-10-CM | POA: Diagnosis not present

## 2022-11-30 DIAGNOSIS — I10 Essential (primary) hypertension: Secondary | ICD-10-CM

## 2022-11-30 DIAGNOSIS — E782 Mixed hyperlipidemia: Secondary | ICD-10-CM | POA: Diagnosis not present

## 2022-11-30 LAB — RENAL FUNCTION PANEL
Albumin: 4.5 g/dL (ref 3.5–5.2)
BUN: 14 mg/dL (ref 6–23)
CO2: 30 mEq/L (ref 19–32)
Calcium: 10.6 mg/dL — ABNORMAL HIGH (ref 8.4–10.5)
Chloride: 104 mEq/L (ref 96–112)
Creatinine, Ser: 0.98 mg/dL (ref 0.40–1.20)
GFR: 56.42 mL/min — ABNORMAL LOW (ref >60.00)
Glucose, Bld: 97 mg/dL (ref 70–99)
Phosphorus: 3.8 mg/dL (ref 2.3–4.6)
Potassium: 4.5 mEq/L (ref 3.5–5.1)
Sodium: 147 mEq/L — ABNORMAL HIGH (ref 135–145)

## 2022-11-30 LAB — HEMOGLOBIN A1C: Hgb A1c MFr Bld: 6.1 % (ref 4.6–6.5)

## 2022-11-30 LAB — LIPID PANEL
Cholesterol: 134 mg/dL (ref 0–200)
HDL: 45.2 mg/dL (ref >39.00)
LDL Cholesterol: 74 mg/dL (ref 0–99)
NonHDL: 88.8
Total CHOL/HDL Ratio: 3
Triglycerides: 76 mg/dL (ref 0.0–149.0)
VLDL: 15.2 mg/dL (ref 0.0–40.0)

## 2022-12-01 ENCOUNTER — Encounter: Payer: Self-pay | Admitting: Dietician

## 2022-12-01 ENCOUNTER — Encounter (HOSPITAL_BASED_OUTPATIENT_CLINIC_OR_DEPARTMENT_OTHER): Payer: Medicare HMO | Admitting: Dietician

## 2022-12-01 DIAGNOSIS — R7303 Prediabetes: Secondary | ICD-10-CM

## 2022-12-01 NOTE — Progress Notes (Signed)
Patient was seen on 12/01/2022 for the Core Session 4 of Diabetes Prevention Program course at [facility]. By the end of this session patients are able to complete the following objectives:   Learning Objectives: Explain the health benefits of eating less fat and fewer calories. Describe the MyPlate food guide and its recommendations, including how to reduce fat and calories in our diet. Compare and contrast MyPlate guidelines with participants' eating habits. List ways to replace high-fat and high-calorie foods with low-fat and low-calorie foods. Explain the importance of eating plenty of whole grains, vegetables, and fruits, while staying within fat gram goals. Explain the importance of eating foods from all groups of MyPlate and of eating a variety of foods from within each group. Explain why a balanced diet is beneficial to health. Explain why eating the same foods over and over is not the best strategy for long-term success.   Goals:  Record weight taken outside of class.  Track foods and beverages eaten each day in the "Food and Activity Tracker," including calories and fat grams for each item.  Practice comparing what you eat with the recommendations of MyPlate using the "Rate Your Plate" handout.  Complete the "Rate Your Plate" handout form on at least 3 days.  Answer homework questions.   Follow-Up Plan: Attend Core Session 5 next week.  Bring completed "Food and Activity Tracker" next week to be reviewed by Lifestyle Coach.

## 2022-12-08 ENCOUNTER — Encounter: Payer: Self-pay | Admitting: Dietician

## 2022-12-08 ENCOUNTER — Encounter (HOSPITAL_BASED_OUTPATIENT_CLINIC_OR_DEPARTMENT_OTHER): Payer: Medicare HMO | Admitting: Dietician

## 2022-12-08 DIAGNOSIS — R7303 Prediabetes: Secondary | ICD-10-CM

## 2022-12-08 NOTE — Progress Notes (Signed)
Patient was seen on 12/08/2022 for the Core Session 5 of Diabetes Prevention Program course at [facility]. By the end of this session patients are able to complete the following objectives:   Learning Objectives: Establish a physical activity goal. Explain the importance of the physical activity goal. Describe their current level of physical activity. Name ways that they are already physically active. Develop personal plans for physical activity for the next week.   Goals:  Record weight taken outside of class.  Track foods and beverages eaten each day in the "Food and Activity Tracker," including calories and fat grams for each item.  Make an Activity Plan including date, specific type of activity, and length of time you plan to be active that includes at last 60 minutes of activity for the week.  Track activity type, minutes you were active, and distance you reached each day in the "Food and Activity Tracker."   Follow-Up Plan: Attend Core Session 6 next week.  Bring completed "Food and Activity Tracker" next week to be reviewed by Lifestyle Coach.

## 2022-12-11 ENCOUNTER — Ambulatory Visit (INDEPENDENT_AMBULATORY_CARE_PROVIDER_SITE_OTHER): Payer: Medicare HMO

## 2022-12-11 VITALS — Ht 65.5 in | Wt 182.0 lb

## 2022-12-11 DIAGNOSIS — Z0000 Encounter for general adult medical examination without abnormal findings: Secondary | ICD-10-CM

## 2022-12-11 NOTE — Progress Notes (Signed)
I connected with  Heidi [name] on 12/11/22 by a audio enabled telemedicine application and verified that I am speaking with the correct person using two identifiers.  Patient Location: Home  Provider Location: Office/Clinic  I discussed the limitations of evaluation and management by telemedicine. The patient expressed understanding and agreed to proceed.  Subjective:   Heidi [name] is a 77 y.o. female who presents for Medicare Annual (Subsequent) preventive examination.  Review of Systems     Cardiac Risk Factors include: advanced age (>48mn, >>70women);dyslipidemia;hypertension     Objective:    Today's Vitals   12/11/22 0811  Weight: 182 lb (82.6 kg)  Height: 5' 5.5" (1.664 m)   Body mass index is 29.83 kg/m.     12/11/2022    8:14 AM 12/07/2021    1:37 PM 11/23/2020    2:18 PM 11/19/2019   10:12 AM 11/13/2018   10:19 AM  Advanced Directives  Does Patient Have a Medical Advance Directive? Yes Yes Yes No Yes  Type of AParamedicof [place] AFBLiving will [place]Living will [place]Living will  [place]Living will  Does patient want to make changes to medical advance directive?     No - Patient declined  Copy of HU[place]in Chart? No - copy requested No - copy requested No - copy requested  No - copy requested  Would patient like information on creating a medical advance directive?    No - Patient declined     Current Medications (verified) Outpatient Encounter Medications as of 12/11/2022  Medication Sig   ALPHA LIPOIC ACID PO Take by mouth at bedtime.   amLODipine (NORVASC) 10 MG tablet Take 1 tablet (10 mg total) by mouth at bedtime.   anastrozole (ARIMIDEX) 1 MG tablet Take 1 mg by mouth daily.   Ascorbic Acid (SM CHEWABLE VITAMIN C) 500 MG CHEW Chew 500 mg by mouth daily.   aspirin 81 MG tablet Take 81 mg by mouth at bedtime.   Biotin 10 MG TABS Take by  mouth daily.   cetirizine (ZYRTEC) 5 MG tablet Take 1 tablet (5 mg total) by mouth at bedtime.   cholecalciferol (VITAMIN D) 1000 units tablet Take 2 tablets daily.   Coenzyme Q10 (COQ10) 100 MG CAPS Take by mouth at bedtime.   fluticasone (FLONASE) 50 MCG/ACT nasal spray Place 2 sprays into both nostrils daily.   losartan (COZAAR) 100 MG tablet Take 1 tablet (100 mg total) by mouth daily.   NON FORMULARY CBD Gummy 10 mg nighty   pravastatin (PRAVACHOL) 40 MG tablet TAKE 1/2 TABLET (20MG TOTAL) DAILY.   Probiotic Product (PROBIOTIC PO) 1 tablet.   No facility-administered encounter medications on file as of 12/11/2022.    Allergies (verified) Patient has no known allergies.   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 lymph nodes   Colon cancer Neg Hx    Pancreatic cancer Neg Hx    Stomach cancer Neg Hx    Social History   Socioeconomic History   Marital status:  Divorced    Spouse name: Not on file   Number of children: 2   Years of education: Not on file   Highest education level: Not on file  Occupational History   Not on file  Tobacco Use   Smoking status: Former    Packs/day: 0.25    Years: 10.00    Total pack years: 2.50    Types: Cigarettes    Quit date: 10/24/1983    Years since quitting: 39.1   Smokeless tobacco: Never  Vaping Use   Vaping Use: Never used  Substance and Sexual Activity   Alcohol use: Not Currently   Drug use: No   Sexual activity: Yes    Partners: Male    Birth control/protection: Surgical    Comment: hysterectomy  Other Topics Concern   Not on file  Social History Narrative   Exercises 3x per week   Caffeine use: 2 cups per day   3 siblings healthy   Social Determinants of Health   Financial Resource Strain: Low Risk  (12/11/2022)   Overall Financial Resource Strain (CARDIA)    Difficulty of Paying Living Expenses: Not hard at all  Food Insecurity: No Food Insecurity (12/11/2022)   Hunger Vital Sign    Worried About Running Out of Food in the Last Year: Never true    Ran Out of Food in the Last Year: Never true  Transportation Needs: No Transportation Needs (12/11/2022)   PRAPARE - Hydrologist (Medical): No    Lack of Transportation (Non-Medical): No  Physical Activity: Sufficiently Active (12/11/2022)   Exercise Vital Sign    Days of Exercise per Week: 3 days    Minutes of Exercise per Session: 50 min  Stress: No Stress Concern Present (12/11/2022)   [place]    Feeling of Stress : Not at all  Social Connections: Socially Isolated (12/07/2021)   Social Connection and Isolation Panel [NHANES]    Frequency of Communication with Friends and Family: More than three times a week    Frequency of Social Gatherings with Friends and Family: More than three times a week    Attends Religious Services: Never     Marine scientist or Organizations: No    Attends Music therapist: Never    Marital Status: Divorced    Tobacco Counseling Counseling given: Not Answered   Clinical Intake:  Pre-visit preparation completed: Yes  Pain : No/denies pain     Nutritional Risks: None Diabetes: No  How often do you need to have someone help you  when you read instructions, pamphlets, or other written materials from your doctor or pharmacy?: 1 - Never  Diabetic? no  Interpreter Needed?: No  Information entered by :: NAllen LPN   Activities of Daily Living    12/11/2022    8:15 AM  In your present state of health, do you have any difficulty performing the following activities:  Hearing? 0  Vision? 0  Difficulty concentrating or making decisions? 0  Walking or climbing stairs? 0  Dressing or bathing? 0  Doing errands, shopping? 0  Preparing Food and eating ? N  Using the Toilet? N  In the past six months, have you accidently leaked urine? N  Do you have problems with loss of bowel control? N  Managing your Medications? N  Managing your Finances? N  Housekeeping or managing your Housekeeping? N    Patient Care Team: Nche, Charlene Brooke, NP as PCP - General (Internal Medicine) Germaine Pomfret, [facility] as Pharmacist (Pharmacist) Salvadore Dom, MD as Consulting Physician (Obstetrics and Gynecology)  Indicate any recent Medical Services you may have received from other than [facility] providers in the past year (date may be approximate).     Assessment:   This is a routine wellness examination for Navpreet.  Hearing/Vision screen Vision Screening - Comments:: Regular eye exams, [facility]  Dietary issues and exercise activities discussed: Current Exercise Habits: Structured exercise class, Type of exercise: calisthenics;strength training/weights, Time (Minutes): 50, Frequency (Times/Week): 3, Weekly Exercise (Minutes/Week): 150   Goals Addressed              This Visit's Progress    Patient Stated       12/11/2022, wants to get to 170 pounds       Depression Screen    12/11/2022    8:15 AM 11/28/2022    9:04 AM 12/07/2021    1:37 PM 11/23/2020    2:21 PM 11/19/2019   10:14 AM 11/13/2018   10:20 AM 11/06/2018    9:21 AM  PHQ 2/9 Scores  PHQ - 2 Score 0 0 0 0 0 0 0    Fall Risk    12/11/2022    8:15 AM 11/28/2022    9:04 AM 12/07/2021    1:39 PM 11/23/2020    2:19 PM 11/19/2019   10:14 AM  [place] in the past year? 0 1 0 1 0  Number falls in past yr: 0 0 0 0 0  Injury with Fall? 0  0 0 0  Risk for fall due to : Medication side effect  No Fall Risks    Follow up Falls prevention discussed;Education provided;Falls evaluation completed  Falls prevention discussed Falls prevention discussed Education provided;Falls prevention discussed    FALL RISK PREVENTION PERTAINING TO THE HOME:  Any stairs in or around the home? Yes  If so, are there any without handrails? Yes  Home free of loose throw rugs in walkways, pet beds, electrical cords, etc? Yes  Adequate lighting in your home to reduce risk of falls? Yes   ASSISTIVE DEVICES UTILIZED TO PREVENT FALLS:  Life alert? No  Use of a cane, walker or w/c? No  Grab bars in the bathroom? No  Shower chair or bench in shower? No  Elevated toilet seat or a handicapped toilet? Yes   TIMED UP AND GO:  Was the test performed? No .      Cognitive Function:    11/13/2018   10:21 AM  MMSE - Mini  Mental State Exam  Orientation to time 5  Orientation to Place 5  Registration 3  Attention/ Calculation 5  Recall 3  Language- name 2 objects 2  Language- repeat 1  Language- follow 3 step command 3  Language- read & follow direction 1  Write a sentence 1  Copy design 1  Total score 30        12/11/2022    8:16 AM  6CIT Screen  What Year? 0 points  What month? 0 points  What time? 0 points  Count back from 20 0 points  Months in reverse 0 points  Repeat phrase 0 points  Total  Score 0 points    Immunizations Immunization History  Administered Date(s) Administered   Fluad Quad(high Dose 65+) 06/19/2019   Influenza Split 10/14/2009, 10/12/2010, 10/12/2011, 10/11/2012   Influenza Whole 10/14/2009, 10/12/2010   Influenza, High Dose Seasonal PF 06/19/2019, 06/22/2022   Influenza,inj,Quad PF,6+ Mos 08/27/2013, 10/14/2014, 11/24/2015, 10/02/2016   Influenza-Unspecified 10/12/2011, 10/11/2012, 07/23/2018, 06/19/2019, 06/24/2020, 07/12/2021   Moderna Sars-Covid-2 Vaccination 11/23/2019, 12/21/2019, 08/17/2020, 01/22/2021, 03/06/2022   PNEUMOCOCCAL CONJUGATE-20 11/04/2021   Pfizer Covid-19 Vaccine Bivalent Booster 21yr & up 07/12/2021   Pneumococcal Conjugate-13 12/11/2016   Pneumococcal Polysaccharide-23 04/11/2012, 08/17/2020   Td 11/23/2010   Td,absorbed, Preservative Free, Adult Use, Lf Unspecified 11/23/2010   Tdap 03/08/2021   Unspecified SARS-COV-2 Vaccination 07/19/2022   Zoster Recombinat (Shingrix) 07/20/2021, 10/26/2021    TDAP status: Up to date  Flu Vaccine status: Up to date  Pneumococcal vaccine status: Up to date  Covid-19 vaccine status: Completed vaccines  Qualifies for Shingles Vaccine? Yes   Zostavax completed Yes   Shingrix Completed?: Yes  Screening Tests Health Maintenance  Topic Date Due   COVID-19 Vaccine (8 - 2023-24 season) 09/13/2022   Medicare Annual Wellness (AWV)  12/07/2022   MAMMOGRAM  09/07/2023   COLONOSCOPY (Pts [phone] coverage will need to be confirmed)  06/23/2029   DTaP/Tdap/Td (4 - Td or Tdap) 03/09/2031   Pneumonia Vaccine 6511Years old  Completed   INFLUENZA VACCINE  Completed   DEXA SCAN  Completed   Hepatitis C Screening  Completed   Zoster Vaccines- Shingrix  Completed   HPV VACCINES  Aged Out    Health Maintenance  Health Maintenance Due  Topic Date Due   COVID-19 Vaccine (8 - 2023-24 season) 09/13/2022   Medicare Annual Wellness (AWV)  12/07/2022    Colorectal cancer screening:  Type of screening: Colonoscopy. Completed 06/24/2019. Repeat every 10 years  Mammogram status: Completed 09/06/2022. Repeat every year  Bone Density status: Completed 02/09/2022.   Lung Cancer Screening: (Low Dose CT Chest recommended if Age 754-80ears, 30 pack-year currently smoking OR have quit w/in 15years.) does not qualify.   Lung Cancer Screening Referral: no  Additional Screening:  Hepatitis C Screening: does qualify; Completed 12/13/2015  Vision Screening: Recommended annual ophthalmology exams for early detection of glaucoma and other disorders of the eye. Is the patient up to date with their annual eye exam?  Yes  Who is the provider or what is the name of the office in which the patient attends annual eye exams? [facility] If pt is not established with a provider, would they like to be referred to a provider to establish care? No .   Dental Screening: Recommended annual dental exams for proper oral hygiene  Community Resource Referral / Chronic Care Management: CRR required this visit?  No   CCM required this visit?  No  Plan:     I have personally reviewed and noted the following in the patient's chart:   Medical and social history Use of alcohol, tobacco or illicit drugs  Current medications and supplements including opioid prescriptions. Patient is not currently taking opioid prescriptions. Functional ability and status Nutritional status Physical activity Advanced directives List of other physicians Hospitalizations, surgeries, and ER visits in previous 12 months Vitals Screenings to include cognitive, depression, and falls Referrals and appointments  In addition, I have reviewed and discussed with patient certain preventive protocols, quality metrics, and best practice recommendations. A written personalized care plan for preventive services as well as general preventive health recommendations were provided to patient.     Kellie Simmering,  LPN   [id number]   Nurse Notes: none  Due to this being a virtual visit, the after visit summary with patients personalized plan was offered to patient via mail or my-chart.  Patient would like to access on my-chart

## 2022-12-11 NOTE — Patient Instructions (Signed)
Heidi [name] , Thank you for taking time to come for your Medicare Wellness Visit. I appreciate your ongoing commitment to your health goals. Please review the following plan we discussed and let me know if I can assist you in the future.   These are the goals we discussed:  Goals      Patient Stated     Drink more water, increase activity & eat less salt     Patient Stated     12/11/2022, wants to get to 170 pounds     Track and Manage My Blood Pressure-Hypertension     Timeframe:  Long-Range Goal Priority:  High Start Date:   11/30/2020                          Expected End Date:  06/01/2023                    Follow Up within 90 days   - check blood pressure daily    Why is this important?   You won't feel high blood pressure, but it can still hurt your blood vessels.  High blood pressure can cause heart or kidney problems. It can also cause a stroke.  Making lifestyle changes like losing a little weight or eating less salt will help.  Checking your blood pressure at home and at different times of the day can help to control blood pressure.  If the doctor prescribes medicine remember to take it the way the doctor ordered.  Call the office if you cannot afford the medicine or if there are questions about it.     Notes:      Weight (lb) < 170 lb (77.1 kg)        This is a list of the screening recommended for you and due dates:  Health Maintenance  Topic Date Due   COVID-19 Vaccine (8 - 2023-24 season) 09/13/2022   Mammogram  09/07/2023   Medicare Annual Wellness Visit  12/12/2023   Colon Cancer Screening  06/23/2029   DTaP/Tdap/Td vaccine (4 - Td or Tdap) 03/09/2031   Pneumonia Vaccine  Completed   Flu Shot  Completed   DEXA scan (bone density measurement)  Completed   Hepatitis C Screening: USPSTF Recommendation to screen - Ages 72-79 yo.  Completed   Zoster (Shingles) Vaccine  Completed   HPV Vaccine  Aged Out    Advanced directives: Please bring a copy of your POA  (Power of [place]) and/or Living Will to your next appointment.   Conditions/risks identified: none  Next appointment: Follow up in one year for your annual wellness visit    Preventive Care 65 Years and Older, Female Preventive care refers to lifestyle choices and visits with your health care provider that can promote health and wellness. What does preventive care include? A yearly physical exam. This is also called an annual well check. Dental exams once or twice a year. Routine eye exams. Ask your health care provider how often you should have your eyes checked. Personal lifestyle choices, including: Daily care of your teeth and gums. Regular physical activity. Eating a healthy diet. Avoiding tobacco and drug use. Limiting alcohol use. Practicing safe sex. Taking low-dose aspirin every day. Taking vitamin and mineral supplements as recommended by your health care provider. What happens during an annual well check? The services and screenings done by your health care provider during your annual well check will depend on your age, overall  health, lifestyle risk factors, and family history of disease. Counseling  Your health care provider may ask you questions about your: Alcohol use. Tobacco use. Drug use. Emotional well-being. Home and relationship well-being. Sexual activity. Eating habits. History of falls. Memory and ability to understand (cognition). Work and work Statistician. Reproductive health. Screening  You may have the following tests or measurements: Height, weight, and BMI. Blood pressure. Lipid and cholesterol levels. These may be checked every 5 years, or more frequently if you are over 26 years old. Skin check. Lung cancer screening. You may have this screening every year starting at age 33 if you have a 30-pack-year history of smoking and currently smoke or have quit within the past 15 years. Fecal occult blood test (FOBT) of the stool. You may have this  test every year starting at age 11. Flexible sigmoidoscopy or colonoscopy. You may have a sigmoidoscopy every 5 years or a colonoscopy every 10 years starting at age 92. Hepatitis C blood test. Hepatitis B blood test. Sexually transmitted disease (STD) testing. Diabetes screening. This is done by checking your blood sugar (glucose) after you have not eaten for a while (fasting). You may have this done every 1-3 years. Bone density scan. This is done to screen for osteoporosis. You may have this done starting at age 71. Mammogram. This may be done every 1-2 years. Talk to your health care provider about how often you should have regular mammograms. Talk with your health care provider about your test results, treatment options, and if necessary, the need for more tests. Vaccines  Your health care provider may recommend certain vaccines, such as: Influenza vaccine. This is recommended every year. Tetanus, diphtheria, and acellular pertussis (Tdap, Td) vaccine. You may need a Td booster every 10 years. Zoster vaccine. You may need this after age 46. Pneumococcal 13-valent conjugate (PCV13) vaccine. One dose is recommended after age 82. Pneumococcal polysaccharide (PPSV23) vaccine. One dose is recommended after age 19. Talk to your health care provider about which screenings and vaccines you need and how often you need them. This information is not intended to replace advice given to you by your health care provider. Make sure you discuss any questions you have with your health care provider. Document Released: 11/05/2015 Document Revised: 06/28/2016 Document Reviewed: 08/10/2015 Elsevier Interactive Patient Education  2017 [place] Prevention in the Home Falls can cause injuries. They can happen to people of all ages. There are many things you can do to make your home safe and to help prevent falls. What can I do on the outside of my home? Regularly fix the edges of walkways and  driveways and fix any cracks. Remove anything that might make you trip as you walk through a door, such as a raised step or threshold. Trim any bushes or trees on the path to your home. Use bright outdoor lighting. Clear any walking paths of anything that might make someone trip, such as rocks or tools. Regularly check to see if handrails are loose or broken. Make sure that both sides of any steps have handrails. Any raised decks and porches should have guardrails on the edges. Have any leaves, snow, or ice cleared regularly. Use sand or salt on walking paths during winter. Clean up any spills in your garage right away. This includes oil or grease spills. What can I do in the bathroom? Use night lights. Install grab bars by the toilet and in the tub and shower. Do not use towel bars  as grab bars. Use non-skid mats or decals in the tub or shower. If you need to sit down in the shower, use a plastic, non-slip stool. Keep the floor dry. Clean up any water that spills on the floor as soon as it happens. Remove soap buildup in the tub or shower regularly. Attach bath mats securely with double-sided non-slip rug tape. Do not have throw rugs and other things on the floor that can make you trip. What can I do in the bedroom? Use night lights. Make sure that you have a light by your bed that is easy to reach. Do not use any sheets or blankets that are too big for your bed. They should not hang down onto the floor. Have a firm chair that has side arms. You can use this for support while you get dressed. Do not have throw rugs and other things on the floor that can make you trip. What can I do in the kitchen? Clean up any spills right away. Avoid walking on wet floors. Keep items that you use a lot in easy-to-reach places. If you need to reach something above you, use a strong step stool that has a grab bar. Keep electrical cords out of the way. Do not use floor polish or wax that makes floors  slippery. If you must use wax, use non-skid floor wax. Do not have throw rugs and other things on the floor that can make you trip. What can I do with my stairs? Do not leave any items on the stairs. Make sure that there are handrails on both sides of the stairs and use them. Fix handrails that are broken or loose. Make sure that handrails are as long as the stairways. Check any carpeting to make sure that it is firmly attached to the stairs. Fix any carpet that is loose or worn. Avoid having throw rugs at the top or bottom of the stairs. If you do have throw rugs, attach them to the floor with carpet tape. Make sure that you have a light switch at the top of the stairs and the bottom of the stairs. If you do not have them, ask someone to add them for you. What else can I do to help prevent falls? Wear shoes that: Do not have high heels. Have rubber bottoms. Are comfortable and fit you well. Are closed at the toe. Do not wear sandals. If you use a stepladder: Make sure that it is fully opened. Do not climb a closed stepladder. Make sure that both sides of the stepladder are locked into place. Ask someone to hold it for you, if possible. Clearly mark and make sure that you can see: Any grab bars or handrails. First and last steps. Where the edge of each step is. Use tools that help you move around (mobility aids) if they are needed. These include: Canes. Walkers. Scooters. Crutches. Turn on the lights when you go into a dark area. Replace any light bulbs as soon as they burn out. Set up your furniture so you have a clear path. Avoid moving your furniture around. If any of your floors are uneven, fix them. If there are any pets around you, be aware of where they are. Review your medicines with your doctor. Some medicines can make you feel dizzy. This can increase your chance of falling. Ask your doctor what other things that you can do to help prevent falls. This information is not  intended to replace advice given to you  by your health care provider. Make sure you discuss any questions you have with your health care provider. Document Released: 08/05/2009 Document Revised: 03/16/2016 Document Reviewed: 11/13/2014 Elsevier Interactive Patient Education  2017 [employer].

## 2022-12-13 ENCOUNTER — Ambulatory Visit: Payer: Medicare HMO

## 2022-12-15 ENCOUNTER — Encounter: Payer: Self-pay | Admitting: Dietician

## 2022-12-15 ENCOUNTER — Encounter (HOSPITAL_BASED_OUTPATIENT_CLINIC_OR_DEPARTMENT_OTHER): Payer: Medicare HMO | Admitting: Dietician

## 2022-12-15 DIAGNOSIS — R7303 Prediabetes: Secondary | ICD-10-CM

## 2022-12-15 NOTE — Progress Notes (Signed)
Patient was seen on 12/15/2022 for the Core Session 6 of Diabetes Prevention Program course at [facility]. By the end of this session patients are able to complete the following objectives:   Learning Objectives: Graph their daily physical activity.  Describe two ways of finding the time to be active.  Define "lifestyle activity."  Describe how to prevent injury.  Develop an activity plan for the coming week.   Goals:  Record weight taken outside of class.  Track foods and beverages eaten each day in the "Food and Activity Tracker," including calories and fat grams for each item.   Track activity type, minutes you were active, and distance you reached each day in the "Food and Activity Tracker."  Set aside one 20 to 30-minute block of time every day or find two or more periods of 10 to15 minutes each for physical activity.  Warm up, cool down, and stretch. Make a Physical Activities Plan for the Week.   Follow-Up Plan: Attend Core Session 7 next week.  Bring completed "Food and Activity Tracker" next week to be reviewed by Lifestyle Coach.

## 2022-12-22 ENCOUNTER — Encounter: Payer: Self-pay | Admitting: Dietician

## 2022-12-22 ENCOUNTER — Encounter: Payer: Medicare HMO | Attending: Nurse Practitioner | Admitting: Dietician

## 2022-12-22 DIAGNOSIS — R7303 Prediabetes: Secondary | ICD-10-CM | POA: Diagnosis not present

## 2022-12-22 NOTE — Progress Notes (Signed)
Patient was seen on 12/22/2022 for the Core Session 7 of Diabetes Prevention Program course at [facility]. By the end of this session patients are able to complete the following objectives:   Learning Objectives: Define calorie balance. Explain how healthy eating and being active are related in terms of calorie balance.  Describe the relationship between calorie balance and weight loss.  Describe his or her progress as it relates to calorie balance.  Develop an activity plan for the coming week.   Goals:  Record weight taken outside of class.  Track foods and beverages eaten each day in the "Food and Activity Tracker," including calories and fat grams for each item.   Track activity type, minutes you were active, and distance you reached each day in the "Food and Activity Tracker."  Set aside one 20 to 30-minute block of time every day or find two or more periods of 10 to15 minutes each for physical activity.  Make a Physical Activities Plan for the Week.  Make active lifestyle choices all through the day  Stay at or go slightly over activity goal.   Follow-Up Plan: Attend Core Session 8 next week.  Bring completed "Food and Activity Tracker" next week to be reviewed by Lifestyle Coach.

## 2022-12-28 ENCOUNTER — Encounter: Payer: Medicare HMO | Attending: Nurse Practitioner | Admitting: Dietician

## 2022-12-28 ENCOUNTER — Encounter: Payer: Self-pay | Admitting: Dietician

## 2022-12-28 DIAGNOSIS — R7303 Prediabetes: Secondary | ICD-10-CM | POA: Diagnosis not present

## 2022-12-28 NOTE — Progress Notes (Signed)
Patient was seen on 12/28/2022 for the Core Session 8 of Diabetes Prevention Program course at [facility]. By the end of this session patients are able to complete the following objectives:   Learning Objectives: Recognize positive and negative food and activity cues.  Change negative food and activity cues to positive cues.  Add positive cues for activity and eliminate cues for inactivity.  Develop a plan for removing one problem food cue for the coming week.   Goals:  Record weight taken outside of class.  Track foods and beverages eaten each day in the "Food and Activity Tracker," including calories and fat grams for each item.   Track activity type, minutes you were active, and distance you reached each day in the "Food and Activity Tracker."  Set aside one 20 to 30-minute block of time every day or find two or more periods of 10 to15 minutes each for physical activity.  Remove one problem food cue.  Add one positive cue for being more active.  Follow-Up Plan: Attend Core Session 9 next week.  Bring completed "Food and Activity Tracker" next week to be reviewed by Lifestyle Coach.

## 2023-01-05 ENCOUNTER — Encounter (HOSPITAL_BASED_OUTPATIENT_CLINIC_OR_DEPARTMENT_OTHER): Payer: Medicare HMO | Admitting: Dietician

## 2023-01-05 ENCOUNTER — Encounter: Payer: Self-pay | Admitting: Dietician

## 2023-01-05 DIAGNOSIS — R7303 Prediabetes: Secondary | ICD-10-CM

## 2023-01-05 DIAGNOSIS — Z79811 Long term (current) use of aromatase inhibitors: Secondary | ICD-10-CM | POA: Diagnosis not present

## 2023-01-05 DIAGNOSIS — C7A8 Other malignant neuroendocrine tumors: Secondary | ICD-10-CM | POA: Diagnosis not present

## 2023-01-05 DIAGNOSIS — C50912 Malignant neoplasm of unspecified site of left female breast: Secondary | ICD-10-CM | POA: Diagnosis not present

## 2023-01-05 NOTE — Progress Notes (Signed)
Patient was seen on 01/05/2023 for the Core Session 9 of Diabetes Prevention Program course at [facility]. By the end of this session patients are able to complete the following objectives:   Learning Objectives: List and describe five steps to problem solving.  Apply the five problem solving steps to resolve a problem he or she has with eating less fat and fewer calories or being more active.   Goals:  Record weight taken outside of class.  Track foods and beverages eaten each day in the "Food and Activity Tracker," including calories and fat grams for each item.   Track activity type, minutes you were active, and distance you reached each day in the "Food and Activity Tracker."  Set aside one 20 to 30-minute block of time every day or find two or more periods of 10 to15 minutes each for physical activity.  Use problem solving action plan created during session to problem solve.   Follow-Up Plan: Attend Core Session 10 next week.  Bring completed "Food and Activity Tracker" next week to be reviewed by Lifestyle Coach. Bring menus from favorite restaurants to next session for future discussion.

## 2023-01-12 ENCOUNTER — Encounter (HOSPITAL_BASED_OUTPATIENT_CLINIC_OR_DEPARTMENT_OTHER): Payer: Medicare HMO | Admitting: Dietician

## 2023-01-12 ENCOUNTER — Encounter: Payer: Self-pay | Admitting: Dietician

## 2023-01-12 DIAGNOSIS — R7303 Prediabetes: Secondary | ICD-10-CM | POA: Diagnosis not present

## 2023-01-12 NOTE — Progress Notes (Signed)
Patient was seen on 01/12/2023 for the Core Session 10 of Diabetes Prevention Program course at [facility]. By the end of this session patients are able to complete the following objectives:   Learning Objectives: List and describe the four keys for healthy eating out.  Give examples of how to apply these keys at the type of restaurants that the participants go to regularly.  Make an appropriate meal selection from a restaurant menu.  Demonstrate how to ask for a substitute item using assertive language and a polite tone of voice.    Goals:  Record weight taken outside of class.  Track foods and beverages eaten each day in the "Food and Activity Tracker," including calories and fat grams for each item.   Track activity type, minutes you were active, and distance you reached each day in the "Food and Activity Tracker."  Set aside one 20 to 30-minute block of time every day or find two or more periods of 10 to15 minutes each for physical activity.  Utilize positive action plan and complete questions on "To Do List."   Follow-Up Plan: Attend Core Session 11 next week.  Bring completed "Food and Activity Tracker" next week to be reviewed by Lifestyle Coach.

## 2023-01-26 ENCOUNTER — Encounter: Payer: Self-pay | Admitting: Dietician

## 2023-01-26 ENCOUNTER — Encounter: Payer: Medicare HMO | Attending: Nurse Practitioner | Admitting: Dietician

## 2023-01-26 DIAGNOSIS — Z08 Encounter for follow-up examination after completed treatment for malignant neoplasm: Secondary | ICD-10-CM | POA: Diagnosis not present

## 2023-01-26 DIAGNOSIS — R7303 Prediabetes: Secondary | ICD-10-CM | POA: Diagnosis not present

## 2023-01-26 DIAGNOSIS — Z85858 Personal history of malignant neoplasm of other endocrine glands: Secondary | ICD-10-CM | POA: Diagnosis not present

## 2023-01-26 DIAGNOSIS — C7A8 Other malignant neuroendocrine tumors: Secondary | ICD-10-CM | POA: Diagnosis not present

## 2023-01-26 NOTE — Progress Notes (Signed)
On 01/26/2023 completed Session 11 of Diabetes Prevention Program course  with [facility]. By the end of this session patients are able to complete the following objectives:   Learning Objectives: Give examples of negative thoughts that could prevent them from meeting their goals of losing weight and being more physically active.  Describe how to stop negative thoughts and talk back to them with positive thoughts.  Practice 1) stopping negative thoughts and 2) talking back to negative thoughts with positive ones.    Goals:  Record weight taken outside of class.  Track foods and beverages eaten each day in the "Food and Activity Tracker," including calories and fat grams for each item.   Track activity type, minutes you were active, and distance you reached each day in the "Food and Activity Tracker."  If you have any negative thoughts-write them in your Food and Activity Trackers, along with how you talked back to them. Practice stopping negative thoughts and talking back to them with positive thoughts.   Follow-Up Plan: Attend Core Session 12 next week.  Bring completed "Food and Activity Tracker" next week to be reviewed by Lifestyle Coach.

## 2023-01-29 DIAGNOSIS — C7B8 Other secondary neuroendocrine tumors: Secondary | ICD-10-CM | POA: Diagnosis not present

## 2023-01-29 DIAGNOSIS — C7A8 Other malignant neuroendocrine tumors: Secondary | ICD-10-CM | POA: Diagnosis not present

## 2023-02-02 ENCOUNTER — Encounter (HOSPITAL_BASED_OUTPATIENT_CLINIC_OR_DEPARTMENT_OTHER): Payer: Medicare HMO | Admitting: Dietician

## 2023-02-02 ENCOUNTER — Encounter: Payer: Self-pay | Admitting: Dietician

## 2023-02-02 DIAGNOSIS — R7303 Prediabetes: Secondary | ICD-10-CM

## 2023-02-02 NOTE — Progress Notes (Signed)
Patient was seen on 02/02/2023 for the Core Session 12 of Diabetes Prevention Program course at [facility]. By the end of this session patients are able to complete the following objectives:   Learning Objectives: Describe their current progress toward defined goals. Describe common causes for slipping from healthy eating or being active. Explain what to do to get back on their feet after a slip.  Goals:  Record weight taken outside of class.  Track foods and beverages eaten each day in the "Food and Activity Tracker," including calories and fat grams for each item.   Track activity type, minutes active, and distance reached each day in the "Food and Activity Tracker."  Try out the two action plans created during session- "Slips from Healthy Eating: Action Plan" and "Slips from Being Active: Action Plan" Answer questions on the handout.   Follow-Up Plan: Attend Core Session 13 next week.  Bring completed "Food and Activity Tracker" next week to be reviewed by Lifestyle Coach.  

## 2023-02-16 ENCOUNTER — Encounter (HOSPITAL_BASED_OUTPATIENT_CLINIC_OR_DEPARTMENT_OTHER): Payer: Medicare HMO | Admitting: Dietician

## 2023-02-16 ENCOUNTER — Encounter: Payer: Self-pay | Admitting: Dietician

## 2023-02-16 DIAGNOSIS — R7303 Prediabetes: Secondary | ICD-10-CM | POA: Diagnosis not present

## 2023-02-16 NOTE — Progress Notes (Signed)
Patient was seen on 02/16/2023 for the Core Session 13 of Diabetes Prevention Program course at [facility]. By the end of this session patients are able to complete the following objectives:   Learning Objectives: Describe ways to add interest and variety to their activity plans. Define ?aerobic fitness. Explain the four F.I.T.T. principles (frequency, intensity, time, and type of activity) and how they relate to aerobic fitness.   Goals:  Record weight taken outside of class.  Track foods and beverages eaten each day in the "Food and Activity Tracker," including calories and fat grams for each item.   Track activity type, minutes you were active, and distance you reached each day in the "Food and Activity Tracker."  Do your best to reach activity goal for the week. Use one of the F.I.T.T. principles to jump start workouts. Document activity level on the "To Do Next Week" handout.  Follow-Up Plan: Attend Core Session 14 next week.  Bring completed "Food and Activity Tracker" next week to be reviewed by Lifestyle Coach.

## 2023-02-23 ENCOUNTER — Encounter: Payer: Medicare HMO | Attending: Nurse Practitioner | Admitting: Dietician

## 2023-02-23 ENCOUNTER — Encounter: Payer: Self-pay | Admitting: Dietician

## 2023-02-23 DIAGNOSIS — R7303 Prediabetes: Secondary | ICD-10-CM | POA: Diagnosis not present

## 2023-02-23 NOTE — Progress Notes (Signed)
Patient was seen on 02/23/2023 for the Core Session 14 of Diabetes Prevention Program course at [facility]. By the end of this session patients are able to complete the following objectives:   Learning Objectives: Give examples of problem social cues and helpful social cues.  Explain how to remove problem social cues and add helpful ones.  Describe ways of coping with vacations and social events such as parties, holidays, and visits from relatives and friends.  Create an action plan to change a problem social cue and add a helpful one.   Goals:  Record weight taken outside of class.  Track foods and beverages eaten each day in the "Food and Activity Tracker," including calories and fat grams for each item.   Track activity type, minutes you were active, and distance you reached each day in the "Food and Activity Tracker."  Do your best to reach activity goal for the week. Use action plan created during session to change a problem social cue and add a helpful social cue.  Answer questions regarding success of changing social cues on "To Do Next Week" handout.   Follow-Up Plan: Attend Core Session 15 next week.  Bring completed "Food and Activity Tracker" next week to be reviewed by Lifestyle Coach.  

## 2023-02-28 DIAGNOSIS — C7B8 Other secondary neuroendocrine tumors: Secondary | ICD-10-CM | POA: Diagnosis not present

## 2023-02-28 DIAGNOSIS — C7A8 Other malignant neuroendocrine tumors: Secondary | ICD-10-CM | POA: Diagnosis not present

## 2023-03-02 ENCOUNTER — Encounter (HOSPITAL_BASED_OUTPATIENT_CLINIC_OR_DEPARTMENT_OTHER): Payer: Medicare HMO | Admitting: Dietician

## 2023-03-02 ENCOUNTER — Encounter: Payer: Self-pay | Admitting: Dietician

## 2023-03-02 DIAGNOSIS — R7303 Prediabetes: Secondary | ICD-10-CM | POA: Diagnosis not present

## 2023-03-02 NOTE — Progress Notes (Signed)
Patient was seen on 03/02/2023 for the Core Session 15 of Diabetes Prevention Program course at [facility]. By the end of this session patients are able to complete the following objectives:   Learning Objectives: Explain how to prevent stress or cope with unavoidable stress.  Describe how this program can be a source of stress.  Explain how to manage stressful situations.  Create and follow an action plan for either preventing or coping with a stressful situation.   Goals:  Record weight taken outside of class.  Track foods and beverages eaten each day in the "Food and Activity Tracker," including calories and fat grams for each item.   Track activity type, minutes you were active, and distance you reached each day in the "Food and Activity Tracker."  Do your best to reach activity goal for the week. Follow your action plan to reduce stress.  Answer questions on handout regarding success of action plan.   Follow-Up Plan: Attend Core Session 16 next week.  Bring completed "Food and Activity Tracker" next week to be reviewed by Lifestyle Coach.  

## 2023-03-07 DIAGNOSIS — Z9889 Other specified postprocedural states: Secondary | ICD-10-CM | POA: Diagnosis not present

## 2023-03-07 DIAGNOSIS — C50112 Malignant neoplasm of central portion of left female breast: Secondary | ICD-10-CM | POA: Diagnosis not present

## 2023-03-07 DIAGNOSIS — Z170 Estrogen receptor positive status [ER+]: Secondary | ICD-10-CM | POA: Diagnosis not present

## 2023-03-07 DIAGNOSIS — C50412 Malignant neoplasm of upper-outer quadrant of left female breast: Secondary | ICD-10-CM | POA: Diagnosis not present

## 2023-03-09 ENCOUNTER — Encounter: Payer: Self-pay | Admitting: Dietician

## 2023-03-09 ENCOUNTER — Encounter: Payer: Medicare HMO | Admitting: Dietician

## 2023-03-09 DIAGNOSIS — R7303 Prediabetes: Secondary | ICD-10-CM

## 2023-03-09 NOTE — Progress Notes (Signed)
On 03/09/23 patient completed a post core session of the Diabetes Prevention Program course with [facility]. By the end of this session patients are able to complete the following objectives:    Learning Objectives: Identify which foods contain carbohydrates.  List functions for carbohydrates on the body.  Describe the relationship between carbohydrate intake and blood sugar.  Create balanced snack choices.   Goals:  Record weight taken outside of class.  Track foods and beverages eaten each day in the "Food and Activity Tracker," including calories and fat grams for each item.   Track activity type, minutes you were active, and distance you reached each day in the "Food and Activity Tracker."   Follow-Up Plan: Attend next session.  Email completed "Food and Activity Trackers" before next session to be reviewed by Lifestyle Coach.  

## 2023-04-04 DIAGNOSIS — C50919 Malignant neoplasm of unspecified site of unspecified female breast: Secondary | ICD-10-CM | POA: Diagnosis not present

## 2023-04-04 DIAGNOSIS — Z08 Encounter for follow-up examination after completed treatment for malignant neoplasm: Secondary | ICD-10-CM | POA: Diagnosis not present

## 2023-04-04 DIAGNOSIS — Z853 Personal history of malignant neoplasm of breast: Secondary | ICD-10-CM | POA: Diagnosis not present

## 2023-04-09 ENCOUNTER — Other Ambulatory Visit: Payer: Self-pay | Admitting: Nurse Practitioner

## 2023-04-09 DIAGNOSIS — E782 Mixed hyperlipidemia: Secondary | ICD-10-CM

## 2023-04-09 DIAGNOSIS — I10 Essential (primary) hypertension: Secondary | ICD-10-CM

## 2023-04-18 NOTE — Telephone Encounter (Signed)
Pharmacy checking on status of this message.

## 2023-04-19 ENCOUNTER — Telehealth: Payer: Medicare HMO

## 2023-04-26 ENCOUNTER — Telehealth: Payer: Medicare HMO

## 2023-05-03 ENCOUNTER — Ambulatory Visit: Payer: Medicare HMO | Admitting: Obstetrics and Gynecology

## 2023-05-04 ENCOUNTER — Encounter: Payer: Self-pay | Admitting: Dietician

## 2023-05-04 ENCOUNTER — Encounter: Payer: Medicare HMO | Attending: Nurse Practitioner | Admitting: Dietician

## 2023-05-04 DIAGNOSIS — R7303 Prediabetes: Secondary | ICD-10-CM | POA: Diagnosis not present

## 2023-05-04 NOTE — Progress Notes (Signed)
Patient was seen on 05/04/2023 for Session 17 of Diabetes Prevention Program course at [facility]. By the end of this session patients are able to complete the following objectives:   Learning Objectives: Identify how to maintain and/or continue working toward program goals for the remainder of the program.  Describe ways that food and activity tracking can assist them in maintaining/reaching program goals.  Identify progress they have made since the beginning of the program.   Goals:  Record weight taken outside of class.  Track foods and beverages eaten each day in the "Food and Activity Tracker," including calories and fat grams for each item.   Track activity type, minutes you were active, and distance you reached each day in the "Food and Activity Tracker."   Follow-Up Plan: Attend session 18 in two weeks.  Bring completed "Food and Activity Trackers" next session to be reviewed by Lifestyle Coach.

## 2023-05-09 IMAGING — MG MM DIGITAL DIAGNOSTIC UNILAT*L* W/ TOMO W/ CAD
8 series · 8 of 24 positions shown · non-contrast
Comparison: Previous exam(s).

CLINICAL DATA: Screening recall for 2 possible masses in the left
breast.

EXAM:
DIGITAL DIAGNOSTIC UNILATERAL LEFT MAMMOGRAM WITH TOMOSYNTHESIS AND
CAD; ULTRASOUND LEFT BREAST LIMITED
TECHNIQUE: Left digital diagnostic mammography and breast tomosynthesis was
performed. The images were evaluated with computer-aided detection.;
Targeted ultrasound examination of the left breast was performed

[L MLO synth-2D (1 of 2)]
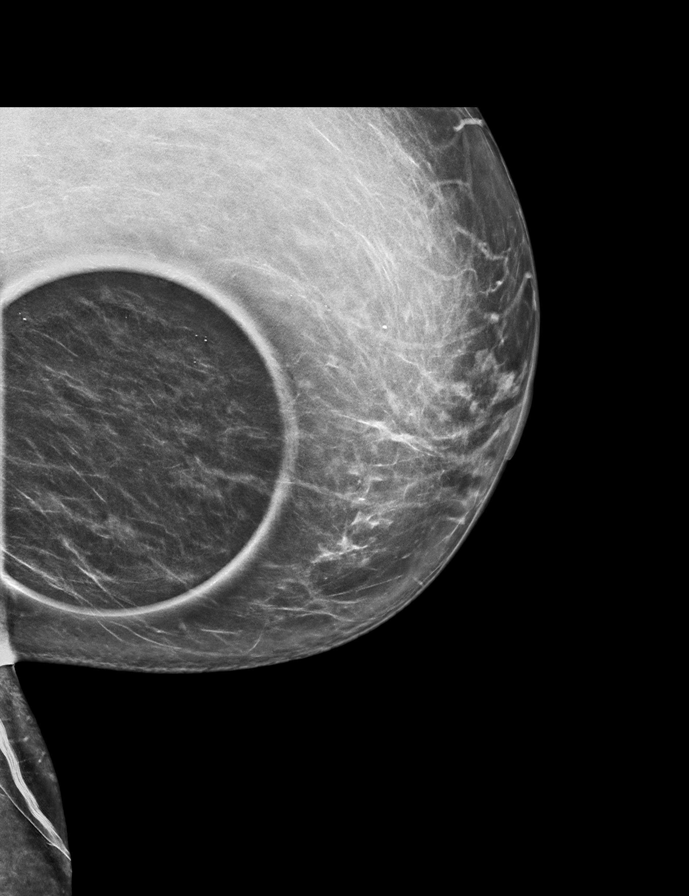

[L CC synth-2D (1 of 2)]
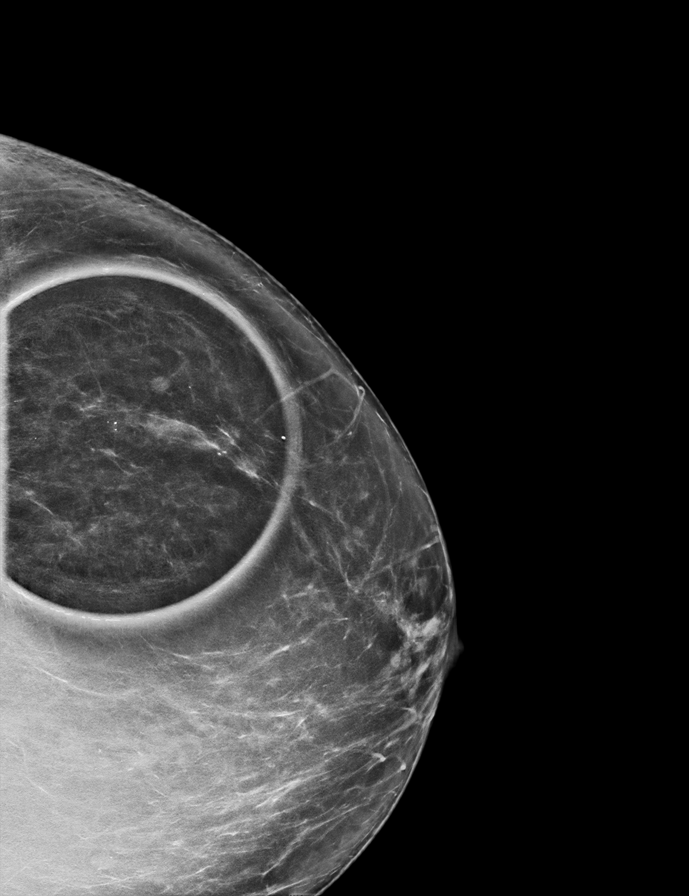

[L MLO synth-2D (2 of 2)]
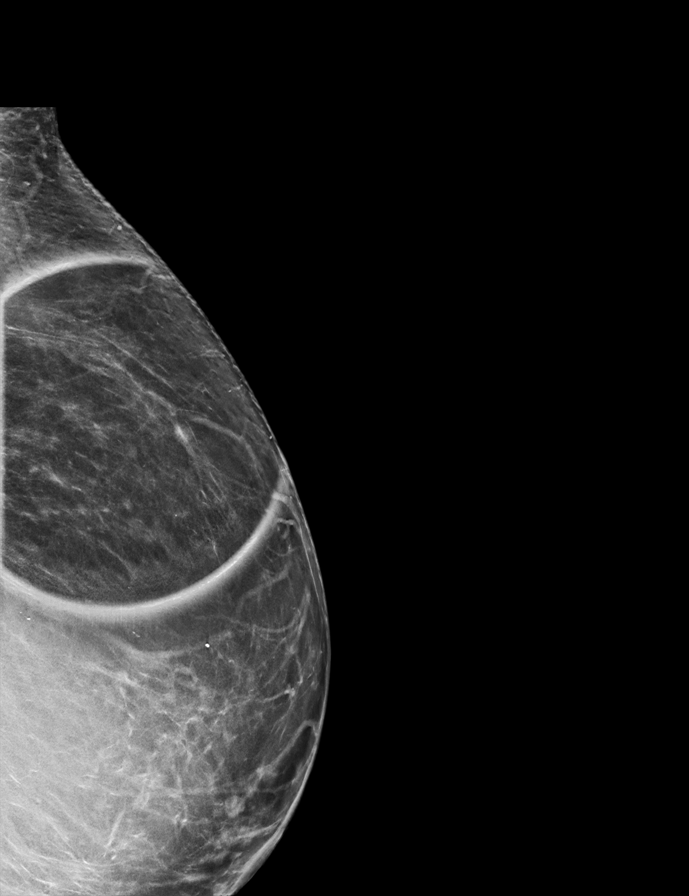

[L CC synth-2D (2 of 2)]
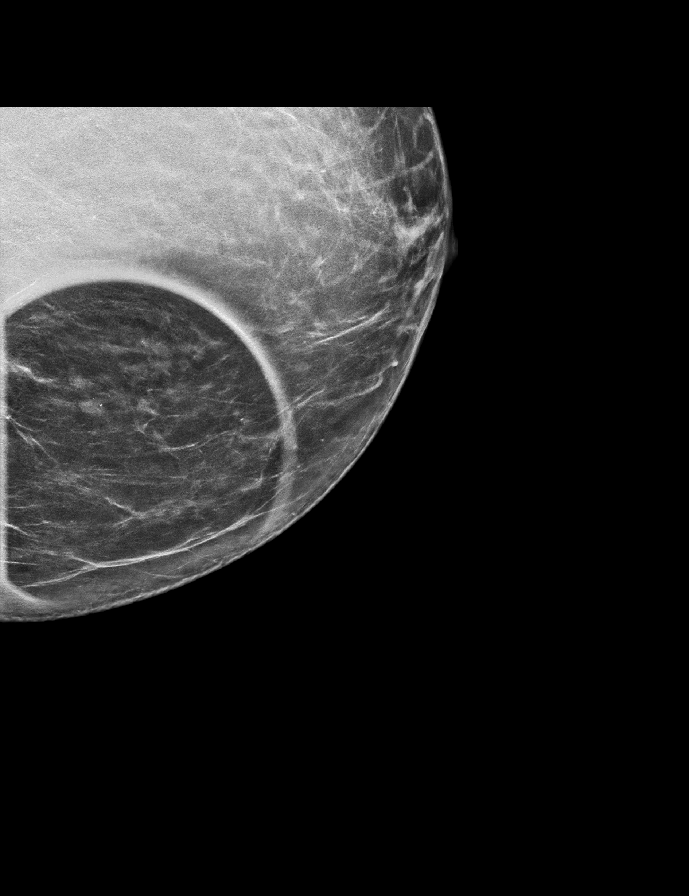

[L MLO tomo (1 of 2) · tomo slice 38/75.0]
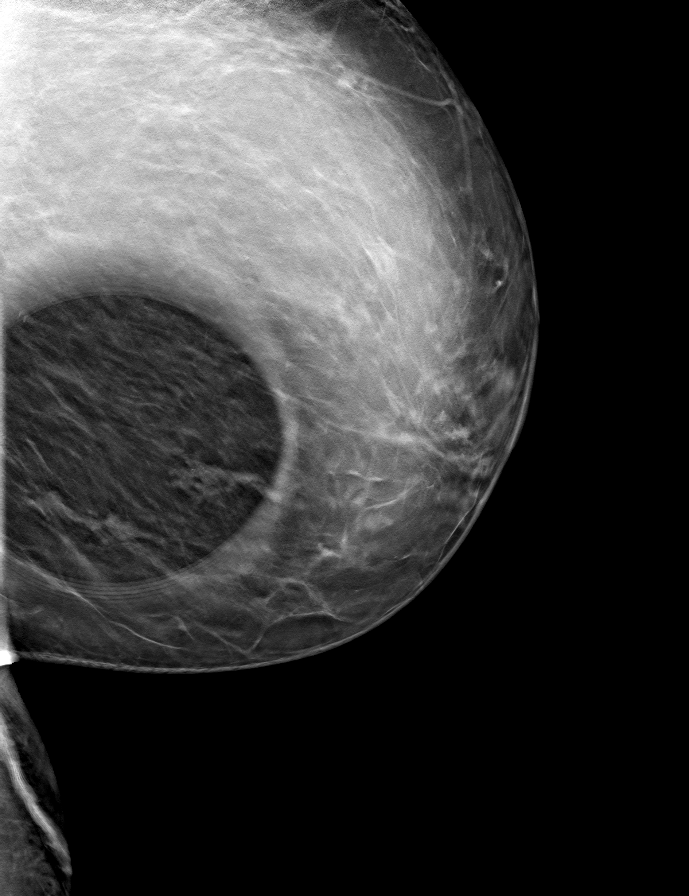

[L CC tomo (1 of 2) · tomo slice 35/68.0]
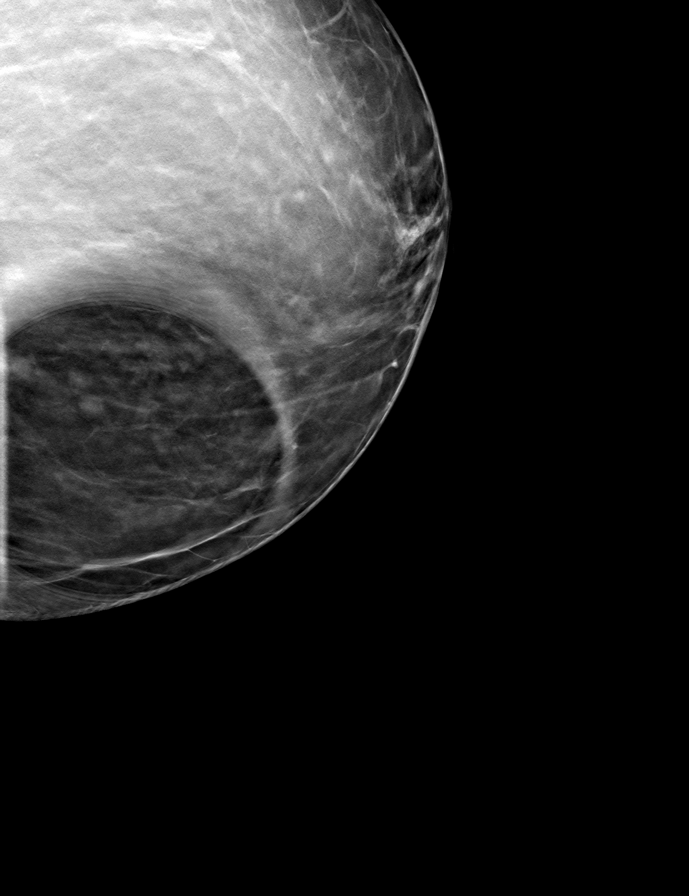

[L MLO tomo (2 of 2) · tomo slice 40/79.0]
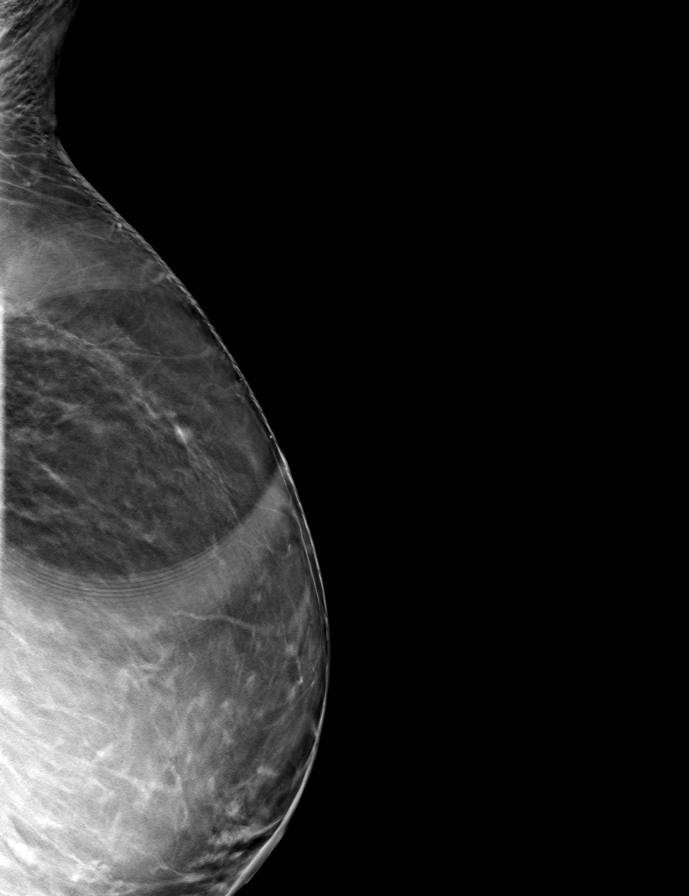

[L CC tomo (2 of 2) · tomo slice 39/76.0]
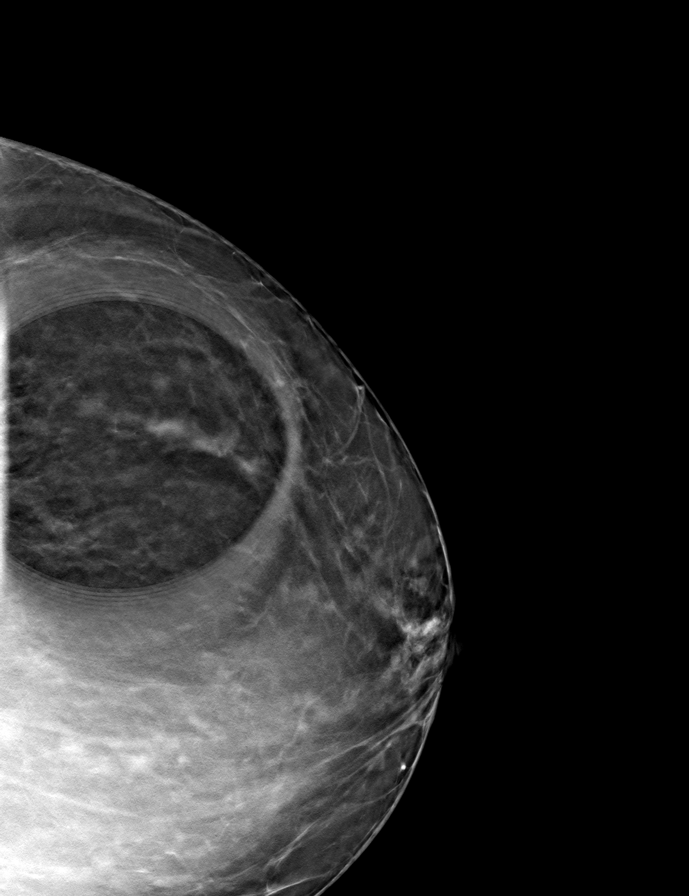

[8 of 24 positions shown; findings below may reference images not displayed]

ACR Breast Density Category b: There are scattered areas of
fibroglandular density.
FINDINGS: Spot compression tomosynthesis images through the upper outer left
breast demonstrates a persistent small dense oval mass in the middle
depth measuring 5 mm. Spot compression tomosynthesis images through
the medial middle depth of the left breast demonstrates a
low-density oval circumscribed mass with 2 associated calcifications
which measures approximately 7 mm.

Ultrasound targeted to the left breast at [DATE], 7 cm from the nipple
demonstrates an oval hypoechoic mass with indistinct margins
measuring 4 x 3 x 3 mm. Ultrasound targeted to the left breast at
[DATE], 6 cm from the nipple demonstrates an anechoic oval
circumscribed mass measuring 6 x 2 x 5 mm. Ultrasound of the left
axilla demonstrates multiple normal-appearing lymph nodes.
IMPRESSION: 1.  There is an indeterminate 4 mm mass in the left breast at [DATE].

2. The mass in the medial left breast corresponds with a benign
cyst.

3.  No evidence of left axillary lymphadenopathy.

RECOMMENDATION:
Ultrasound guided biopsy is recommended for the left breast mass at
[DATE]. The procedure has been scheduled for 05/05/2021 at [DATE] p.m.

I have discussed the findings and recommendations with the patient.
If applicable, a reminder letter will be sent to the patient
regarding the next appointment.

BI-RADS CATEGORY  4: Suspicious.

## 2023-05-29 ENCOUNTER — Encounter: Payer: Self-pay | Admitting: Nurse Practitioner

## 2023-05-29 ENCOUNTER — Ambulatory Visit (INDEPENDENT_AMBULATORY_CARE_PROVIDER_SITE_OTHER): Payer: Medicare HMO | Admitting: Nurse Practitioner

## 2023-05-29 VITALS — BP 120/78 | HR 62 | Temp 98.0°F | Resp 16 | Ht 65.5 in | Wt 165.0 lb

## 2023-05-29 DIAGNOSIS — I10 Essential (primary) hypertension: Secondary | ICD-10-CM

## 2023-05-29 DIAGNOSIS — R7303 Prediabetes: Secondary | ICD-10-CM

## 2023-05-29 DIAGNOSIS — K581 Irritable bowel syndrome with constipation: Secondary | ICD-10-CM | POA: Diagnosis not present

## 2023-05-29 LAB — POCT GLYCOSYLATED HEMOGLOBIN (HGB A1C)
HbA1c POC (<> result, manual entry): 5.4 % (ref 4.0–5.6)
Hemoglobin A1C: 5.4 % (ref 4.0–5.6)

## 2023-05-29 MED ORDER — LINACLOTIDE 72 MCG PO CAPS
72.0000 ug | ORAL_CAPSULE | Freq: Every day | ORAL | 5 refills | Status: DC
Start: 2023-05-29 — End: 2023-07-17

## 2023-05-29 NOTE — Assessment & Plan Note (Signed)
BP at goal with losartan and amlodipine BP Readings from Last 3 Encounters:  05/29/23 120/78  11/28/22 120/70  08/18/22 126/62    Repeat BMP Maintain med dose

## 2023-05-29 NOTE — Patient Instructions (Addendum)
Magnesium glycinate and metamucil for constipation Maintain at least 60-64oz of water daily. Keep up the good work

## 2023-05-29 NOTE — Progress Notes (Signed)
Established Patient Visit  Patient: Heidi [name]   DOB: [birth year]   77 y.o. Female  MRN: [id number] Visit Date: 05/29/2023  Subjective:    Chief Complaint  Patient presents with   Hypertension   Hyperglycemia   Hypertension  Hyperglycemia   Irritable bowel syndrome With chronic constipation. Has 1-2 BM pwe week PET scan 01/2023 due to hx of neuroendocrine cancer: No evidence of recurrent or new metastatic neuroendocrine disease. Resolution of the previously described left axillary lymphadenopathy.  Last colonoscopy 2020 by [facility] No improvement with MOM, miralax, colace 200mg  , senna, coffee, prunes. Admits to inadequate oral hydration: 48oz of less.  Sent linzess, advised about need for PA Start metamucil daily with 60-64oz of water daily and magnesium glycinate, while waiting for linzess PA to be complete.  Essential hypertension BP at goal with losartan and amlodipine BP Readings from Last 3 Encounters:  05/29/23 120/78  11/28/22 120/70  08/18/22 126/62    Repeat BMP Maintain med dose  Prediabetes Repeat hgbA1c: 5.6% improved with lifestyle modification: nutrition and exercise. This has also led to 17lbs weight loss in last 6months.  Encourage to maintain current lifestyle modifications F/up in 6months    Wt Readings from Last 3 Encounters:  05/29/23 165 lb (74.8 kg)  12/11/22 182 lb (82.6 kg)  11/28/22 184 lb (83.5 kg)    Reviewed medical, surgical, and social history today  Medications: Outpatient Medications Prior to Visit  Medication Sig   ALPHA LIPOIC ACID PO Take by mouth at bedtime.   amLODipine (NORVASC) 10 MG tablet TAKE 1 TABLET AT BEDTIME   anastrozole (ARIMIDEX) 1 MG tablet Take 1 mg by mouth daily.   Ascorbic Acid (SM CHEWABLE VITAMIN C) 500 MG CHEW Chew 500 mg by mouth daily.   aspirin 81 MG tablet Take 81 mg by mouth at bedtime.   Biotin 10 MG TABS Take by mouth daily.   cetirizine (ZYRTEC) 5 MG tablet Take 1 tablet (5 mg  total) by mouth at bedtime.   cholecalciferol (VITAMIN D) 1000 units tablet Take 2 tablets daily.   Coenzyme Q10 (COQ10) 100 MG CAPS Take by mouth at bedtime.   fluticasone (FLONASE) 50 MCG/ACT nasal spray Place 2 sprays into both nostrils daily.   losartan (COZAAR) 100 MG tablet TAKE 1 TABLET DAILY. DOSE  CHANGE   NON FORMULARY CBD Gummy 10 mg nighty   pravastatin (PRAVACHOL) 40 MG tablet TAKE 1/2 TABLET DAILY   Probiotic Product (PROBIOTIC PO) 1 tablet.   No facility-administered medications prior to visit.   Reviewed past medical and social history.   ROS per HPI above  Last CBC Lab Results  Component Value Date   WBC 7.2 12/09/2019   HGB 13.4 12/09/2019   HCT 41 12/09/2019   MCV 89.9 11/27/2017   RDW 14.9 11/27/2017   PLT 303 12/09/2019   Last metabolic panel Lab Results  Component Value Date   GLUCOSE 97 11/30/2022   NA 147 (H) 11/30/2022   K 4.5 11/30/2022   CL 104 11/30/2022   CO2 30 11/30/2022   BUN 14 11/30/2022   CREATININE 0.98 11/30/2022   GFR 56.42 (L) 11/30/2022   CALCIUM 10.6 (H) 11/30/2022   PHOS 3.8 11/30/2022   PROT 7.1 05/13/2019   ALBUMIN 4.5 11/30/2022   BILITOT 0.7 05/13/2019   ALKPHOS 63 05/13/2019   AST 14 12/09/2019   ALT 10 12/09/2019   Last lipids Lab Results  Component Value Date   CHOL 134 11/30/2022   HDL 45.20 11/30/2022   LDLCALC 74 11/30/2022   LDLDIRECT 149.6 07/08/2007   TRIG 76.0 11/30/2022   CHOLHDL 3 11/30/2022   Last hemoglobin A1c Lab Results  Component Value Date   HGBA1C 5.4 05/29/2023   HGBA1C 5.4 05/29/2023        Objective:  BP 120/78 (BP Location: Left Arm, Patient Position: Sitting, Cuff Size: Large)   Pulse 62   Temp 98 F (36.7 C) (Temporal)   Resp 16   Ht 5' 5.5" (1.664 m)   Wt 165 lb (74.8 kg)   LMP 10/23/1994 (Approximate)   SpO2 100%   BMI 27.04 kg/m      Physical Exam Cardiovascular:     Rate and Rhythm: Normal rate.     Pulses: Normal pulses.  Pulmonary:     Effort: Pulmonary  effort is normal.  Neurological:     Mental Status: She is alert and oriented to person, place, and time.     Results for orders placed or performed in visit on 05/29/23  POCT glycosylated hemoglobin (Hb A1C)  Result Value Ref Range   Hemoglobin A1C 5.4 4.0 - 5.6 %   HbA1c POC (<> result, manual entry) 5.4 4.0 - 5.6 %   HbA1c, POC (prediabetic range)     HbA1c, POC (controlled diabetic range)        Assessment & Plan:    Problem List Items Addressed This Visit       Cardiovascular and Mediastinum   Essential hypertension - Primary    BP at goal with losartan and amlodipine BP Readings from Last 3 Encounters:  05/29/23 120/78  11/28/22 120/70  08/18/22 126/62    Repeat BMP Maintain med dose        Digestive   Irritable bowel syndrome    With chronic constipation. Has 1-2 BM pwe week PET scan 01/2023 due to hx of neuroendocrine cancer: No evidence of recurrent or new metastatic neuroendocrine disease. Resolution of the previously described left axillary lymphadenopathy.  Last colonoscopy 2020 by [facility] No improvement with MOM, miralax, colace 200mg  , senna, coffee, prunes. Admits to inadequate oral hydration: 48oz of less.  Sent linzess, advised about need for PA Start metamucil daily with 60-64oz of water daily and magnesium glycinate, while waiting for linzess PA to be complete.      Relevant Medications   linaclotide (LINZESS) 72 MCG capsule     Other   Prediabetes    Repeat hgbA1c: 5.6% improved with lifestyle modification: nutrition and exercise. This has also led to 17lbs weight loss in last 6months.  Encourage to maintain current lifestyle modifications F/up in 6months      Relevant Orders   POCT glycosylated hemoglobin (Hb A1C) (Completed)   Return in about 6 months (around 11/29/2023) for HTN, Hyperglycemia, hyperlipidemia (fasting).     Alysia Penna, NP

## 2023-05-29 NOTE — Assessment & Plan Note (Signed)
With chronic constipation. Has 1-2 BM pwe week PET scan 01/2023 due to hx of neuroendocrine cancer: No evidence of recurrent or new metastatic neuroendocrine disease. Resolution of the previously described left axillary lymphadenopathy.  Last colonoscopy 2020 by [facility] No improvement with MOM, miralax, colace 200mg  , senna, coffee, prunes. Admits to inadequate oral hydration: 48oz of less.  Sent linzess, advised about need for PA Start metamucil daily with 60-64oz of water daily and magnesium glycinate, while waiting for linzess PA to be complete.

## 2023-05-29 NOTE — Assessment & Plan Note (Signed)
Repeat hgbA1c: 5.6% improved with lifestyle modification: nutrition and exercise. This has also led to 17lbs weight loss in last 6months.  Encourage to maintain current lifestyle modifications F/up in 6months

## 2023-06-01 ENCOUNTER — Encounter: Payer: Medicare HMO | Attending: Nurse Practitioner | Admitting: Dietician

## 2023-06-01 ENCOUNTER — Encounter: Payer: Self-pay | Admitting: Dietician

## 2023-06-01 DIAGNOSIS — R7303 Prediabetes: Secondary | ICD-10-CM | POA: Insufficient documentation

## 2023-06-01 NOTE — Progress Notes (Signed)
Patient was seen on 06/01/2023 for a post core session of Diabetes Prevention Program course at [facility]. By the end of this session patients are able to complete the following objectives:    Learning Objectives: Explain how glucose is used in the body and it's relationship with insulin/insulin resistance.  Identify symptoms of diabetes.  Describe lab tests used to diagnose diabetes.  Describe health complications and conditions related to diabetes.    Goals:  Record weight taken outside of class.  Track foods and beverages eaten each day in the "Food and Activity Tracker," including calories and fat grams for each item.   Track activity type, minutes you were active, and distance you reached each day in the "Food and Activity Tracker."    Follow-Up Plan: Attend session 19 in two weeks.  Bring completed "Food and Activity Trackers" to next session to be reviewed by Lifestyle Coach.

## 2023-07-05 ENCOUNTER — Encounter: Payer: Self-pay | Admitting: Obstetrics and Gynecology

## 2023-07-05 ENCOUNTER — Ambulatory Visit (INDEPENDENT_AMBULATORY_CARE_PROVIDER_SITE_OTHER): Payer: Medicare HMO | Admitting: Obstetrics and Gynecology

## 2023-07-05 VITALS — BP 118/70 | HR 70 | Resp 16 | Ht 64.75 in | Wt 168.0 lb

## 2023-07-05 DIAGNOSIS — Z853 Personal history of malignant neoplasm of breast: Secondary | ICD-10-CM

## 2023-07-05 DIAGNOSIS — Z9189 Other specified personal risk factors, not elsewhere classified: Secondary | ICD-10-CM | POA: Diagnosis not present

## 2023-07-05 DIAGNOSIS — Z9289 Personal history of other medical treatment: Secondary | ICD-10-CM

## 2023-07-05 NOTE — Progress Notes (Signed)
77 y.o. y.o. female here for annual medicare exam. She denies any PM bleeding, vaginal discharge or pelvic pain.   She had early surgical menopause for fibroids.  She reports using estrogen after.  She has a recent history of breast cancer with lumpectomy and radiation.  Next mammogram is due in May  Dexa 2023 She has a history of RCC with nephrectomy, and an intestinal cancer as well.   Blood pressure 118/70, pulse 70, resp. rate 16, height 5' 4.75" (1.645 m), weight 168 lb (76.2 kg), last menstrual period 10/23/1994.  No results found for: "DIAGPAP", "HPVHIGH", "ADEQPAP"  GYN HISTORY: No results found for: "DIAGPAP", "HPVHIGH", "ADEQPAP" Last pap smear 2009 and reports all normal  OB History  Gravida Para Term Preterm AB Living  3 2 2  0 1 2  SAB IAB Ectopic Multiple Live Births  1 0 0 0 2    # Outcome Date GA Lbr Len/2nd Weight Sex Type Anes PTL Lv  3 Term         LIV  2 Term         LIV  1 SAB             Past Medical History:  Diagnosis Date   Anxiety    Breast cancer (HCC)    Chronic rhinitis    DJD (degenerative joint disease)    GERD (gastroesophageal reflux disease)    History of headache    HTN (hypertension)    Hypercholesterolemia    IBS (irritable bowel syndrome)    Malignant neoplasm of kidney excluding renal pelvis (HCC) 01/11/2008   Qualifier: History of  By: Kriste Basque MD, Scott M    Mesenteric mass 06/08/2016   Recurrent UTI 10/07/2021   Renal cell carcinoma 2001   Small bowel mass    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     amLODipine  (NORVASC) 10 MG tablet TAKE 1 TABLET AT BEDTIME 90 tablet 3   anastrozole (ARIMIDEX) 1 MG tablet Take 1 mg by mouth daily.     Ascorbic Acid (SM CHEWABLE VITAMIN C) 500 MG CHEW Chew 500 mg by mouth daily.     aspirin 81 MG tablet Take 81 mg by mouth at bedtime.     Biotin 10 MG TABS Take by mouth daily.     cetirizine (ZYRTEC) 5 MG tablet Take 1 tablet (5 mg total) by mouth at bedtime.     cholecalciferol (VITAMIN D) 1000 units tablet Take 2 tablets daily. 30 tablet 3   Coenzyme Q10 (COQ10) 100 MG CAPS Take by mouth at bedtime.     fluticasone (FLONASE) 50 MCG/ACT nasal spray Place 2 sprays into both nostrils daily. 16 g 3   linaclotide (LINZESS) 72 MCG capsule Take 1 capsule (72 mcg total) by mouth daily before breakfast. 30 capsule 5   losartan (COZAAR) 100 MG tablet TAKE 1 TABLET DAILY. DOSE  CHANGE 90 tablet 3   MAGNESIUM PO Take by mouth.     Multiple Vitamin (  MULTIVITAMIN PO) Take by mouth.     NON FORMULARY CBD Gummy 10 mg prn     pravastatin (PRAVACHOL) 40 MG tablet TAKE 1/2 TABLET DAILY 45 tablet 3   Probiotic Product (PROBIOTIC PO) 1 tablet.     No current facility-administered medications on file prior to visit.    Social History   Socioeconomic History   Marital status: Divorced    Spouse name: Not on file   Number of children: 2   Years of education: Not on file   Highest education level: Not on file  Occupational History   Not on file  Tobacco Use   Smoking status: Former    Current packs/day: 0.00    Average packs/day: 0.3 packs/day for 10.0 years (2.5 ttl pk-yrs)    Types: Cigarettes    Start date: 10/23/1973    Quit date: 10/24/1983    Years since quitting: 39.7   Smokeless tobacco: Never  Vaping Use   Vaping status: Never Used  Substance and Sexual Activity   Alcohol use: Not Currently   Drug use: No   Sexual activity: Not Currently    Partners: Male    Birth control/protection: Surgical    Comment: hysterectomy, older than 16, more than 5  Other Topics  Concern   Not on file  Social History Narrative   Exercises 3x per week   Caffeine use: 2 cups per day   3 siblings healthy   Social Determinants of Health   Financial Resource Strain: Low Risk  (12/11/2022)   Overall Financial Resource Strain (CARDIA)    Difficulty of Paying Living Expenses: Not hard at all  Food Insecurity: No Food Insecurity (12/11/2022)   Hunger Vital Sign    Worried About Running Out of Food in the Last Year: Never true    Ran Out of Food in the Last Year: Never true  Transportation Needs: No Transportation Needs (12/11/2022)   PRAPARE - Administrator, Civil Service (Medical): No    Lack of Transportation (Non-Medical): No  Physical Activity: Sufficiently Active (12/11/2022)   Exercise Vital Sign    Days of Exercise per Week: 3 days    Minutes of Exercise per Session: 50 min  Stress: No Stress Concern Present (12/11/2022)   [employer] of Occupational Health - Occupational Stress Questionnaire    Feeling of Stress : Not at all  Social Connections: Socially Isolated (12/07/2021)   Social Connection and Isolation Panel [NHANES]    Frequency of Communication with Friends and Family: More than three times a week    Frequency of Social Gatherings with Friends and Family: More than three times a week    Attends Religious Services: Never    Database administrator or Organizations: No    Attends Banker Meetings: Never    Marital Status: Divorced  Catering manager Violence: Not At Risk (12/07/2021)   Humiliation, Afraid, Rape, and Kick questionnaire    Fear of Current or Ex-Partner: No    Emotionally Abused: No    Physically Abused: No    Sexually Abused: No    Family History  Problem Relation Age of Onset   Healthy Mother    Diabetes Mother    Hyperlipidemia Mother    Hypertension Mother    Prostate cancer Father    Cancer Father    Breast cancer Sister    Cancer - Other Brother        neck ? lymph nodes   Atrial  fibrillation  Brother    Colon cancer Neg Hx    Pancreatic cancer Neg Hx    Stomach cancer Neg Hx      No Known Allergies    Patient's last menstrual period was Patient's last menstrual period was 10/23/1994 (approximate)..          Sexually active: denies. Has no libido on the anastrozole. Husband left  Exercising: yes walking In prediabetic program and has lost 20lbs with normal return to hga1c   Review of Systems OAB-sees urology     PE General appearance: alert, cooperative and appears stated age Head: Normocephalic, without obvious abnormality, atraumatic Neck: no adenopathy, supple, symmetrical, trachea midline and thyroid normal to inspection and palpation Lungs: clear to auscultation bilaterally Breasts: normal appearance, no masses or tenderness Heart: regular rate and rhythm Abdomen: soft, non-tender; bowel sounds normal; no masses,  no organomegaly Extremities: extremities normal, atraumatic, no cyanosis or edema Skin: Skin color, texture, turgor normal. No rashes or lesions Lymph nodes: Cervical, supraclavicular, and axillary nodes normal. No abnormal inguinal nodes palpated Neurologic: Grossly normal     Pelvic: External genitalia:  no lesions              Urethra:  normal appearing urethra with no masses, tenderness or lesions              Bartholins and Skenes: normal                 Vagina: atrophic appearing vagina with normal color and discharge, no lesions.               Cervix: no lesions, no cervical motion tenderness               Bimanual Exam:  Uterus:  removed. Atrophic vaginitis              Adnexa: removed-no mass, fullness, tenderness          Chaperone was present for exam.   A:         Well Woman medicare GYN exam                             P:        Pap smear not collected.  To stop all screening in future             Encouraged mammogram screening             Colonoscopy UTD, per patient             Labs and immunizations with her  primary             Encouraged vit D and calcium Repeat bone scan next year.  Earley Favor

## 2023-07-06 DIAGNOSIS — Z79811 Long term (current) use of aromatase inhibitors: Secondary | ICD-10-CM | POA: Diagnosis not present

## 2023-07-06 DIAGNOSIS — C7A8 Other malignant neuroendocrine tumors: Secondary | ICD-10-CM | POA: Diagnosis not present

## 2023-07-13 ENCOUNTER — Encounter: Payer: Medicare HMO | Attending: Nurse Practitioner | Admitting: Dietician

## 2023-07-13 ENCOUNTER — Encounter: Payer: Self-pay | Admitting: Dietician

## 2023-07-13 DIAGNOSIS — R7303 Prediabetes: Secondary | ICD-10-CM | POA: Insufficient documentation

## 2023-07-13 NOTE — Progress Notes (Signed)
Patient was seen on 07/13/2023 for the Diabetes Prevention Program course at [facility]. By the end of this session patients are able to complete the following objectives:   Learning Objectives: Describe the differences between unsaturated, saturated, and trans fat on heart health.  List dietary sources of unsaturated, saturated, and trans fats. Explain ways to reduce intake of saturated fat and replace them with heart healthy fats.  Goals:  Record weight taken outside of class.  Track foods and beverages eaten each day in the "Food and Activity Tracker," including calories and fat grams for each item.   Track activity type, minutes you were active, and distance you reached each day in the "Food and Activity Tracker."   Follow-Up Plan: Attend next session.  Bring completed "Food and Activity Trackers" to next session to be reviewed by Lifestyle Coach.

## 2023-07-16 ENCOUNTER — Encounter: Payer: Self-pay | Admitting: Nurse Practitioner

## 2023-07-16 DIAGNOSIS — K581 Irritable bowel syndrome with constipation: Secondary | ICD-10-CM

## 2023-07-17 MED ORDER — LINACLOTIDE 145 MCG PO CAPS
145.0000 ug | ORAL_CAPSULE | Freq: Every day | ORAL | 5 refills | Status: AC
Start: 2023-07-17 — End: ?

## 2023-07-23 ENCOUNTER — Telehealth: Payer: Self-pay | Admitting: Nurse Practitioner

## 2023-07-23 DIAGNOSIS — K581 Irritable bowel syndrome with constipation: Secondary | ICD-10-CM

## 2023-07-23 MED ORDER — LINACLOTIDE 145 MCG PO CAPS
145.0000 ug | ORAL_CAPSULE | Freq: Every day | ORAL | 5 refills | Status: DC
Start: 2023-07-23 — End: 2023-12-06

## 2023-07-23 NOTE — Telephone Encounter (Signed)
Patient notified that Rx sent to pharmacy. 

## 2023-07-23 NOTE — Telephone Encounter (Signed)
07/23/23 - Pt called stating that the pcp said she was going to update her medication linaclotide (LINZESS) 145 MCG CAPS capsule [[id number]] and send to the pharmacy. She called the pharmacy but they do not have it yet. She wants the medication re-sent to   [facility] [address] [place], [place] - [address] Phone: [phone]  Fax: [phone]

## 2023-10-05 ENCOUNTER — Encounter: Payer: Self-pay | Admitting: Dietician

## 2023-10-05 ENCOUNTER — Encounter: Payer: Medicare HMO | Attending: Nurse Practitioner | Admitting: Dietician

## 2023-10-05 DIAGNOSIS — R7303 Prediabetes: Secondary | ICD-10-CM | POA: Insufficient documentation

## 2023-10-05 NOTE — Progress Notes (Signed)
Patient was seen on 10/05/2023 for the Diabetes Prevention Program course at [facility]. By the end of this session patients are able to complete the following objectives:   Learning Objectives: Reflect on lifestyle changes they have made since starting the DPP.  Set long-term goals to promote continued maintenance of lifestyle changes made during the program.   Goals:  Work toward reaching new long-term goals set during class.   Follow-Up Plan: Contact Lifestyle Coach with questions/concerns PRN.

## 2023-10-09 DIAGNOSIS — Z853 Personal history of malignant neoplasm of breast: Secondary | ICD-10-CM | POA: Diagnosis not present

## 2023-10-09 DIAGNOSIS — Z08 Encounter for follow-up examination after completed treatment for malignant neoplasm: Secondary | ICD-10-CM | POA: Diagnosis not present

## 2023-10-09 DIAGNOSIS — C50412 Malignant neoplasm of upper-outer quadrant of left female breast: Secondary | ICD-10-CM | POA: Diagnosis not present

## 2023-10-09 DIAGNOSIS — Z87891 Personal history of nicotine dependence: Secondary | ICD-10-CM | POA: Diagnosis not present

## 2023-10-09 DIAGNOSIS — Z9889 Other specified postprocedural states: Secondary | ICD-10-CM | POA: Diagnosis not present

## 2023-10-09 DIAGNOSIS — Z923 Personal history of irradiation: Secondary | ICD-10-CM | POA: Diagnosis not present

## 2023-11-29 ENCOUNTER — Ambulatory Visit: Payer: Medicare HMO | Admitting: Nurse Practitioner

## 2023-12-06 ENCOUNTER — Encounter: Payer: Self-pay | Admitting: Nurse Practitioner

## 2023-12-06 ENCOUNTER — Ambulatory Visit: Payer: Medicare HMO | Admitting: Nurse Practitioner

## 2023-12-06 VITALS — BP 128/64 | HR 57 | Temp 98.3°F | Ht 64.75 in | Wt 177.8 lb

## 2023-12-06 DIAGNOSIS — K581 Irritable bowel syndrome with constipation: Secondary | ICD-10-CM

## 2023-12-06 DIAGNOSIS — R7303 Prediabetes: Secondary | ICD-10-CM

## 2023-12-06 DIAGNOSIS — E782 Mixed hyperlipidemia: Secondary | ICD-10-CM

## 2023-12-06 DIAGNOSIS — C7B8 Other secondary neuroendocrine tumors: Secondary | ICD-10-CM

## 2023-12-06 DIAGNOSIS — C7A8 Other malignant neuroendocrine tumors: Secondary | ICD-10-CM

## 2023-12-06 DIAGNOSIS — I10 Essential (primary) hypertension: Secondary | ICD-10-CM

## 2023-12-06 LAB — LIPID PANEL
Cholesterol: 157 mg/dL (ref 0–200)
HDL: 58.9 mg/dL (ref >39.00)
LDL Cholesterol: 78 mg/dL (ref 0–99)
NonHDL: 97.86
Total CHOL/HDL Ratio: 3
Triglycerides: 100 mg/dL (ref 0.0–149.0)
VLDL: 20 mg/dL (ref 0.0–40.0)

## 2023-12-06 LAB — COMPREHENSIVE METABOLIC PANEL
ALT: 12 U/L (ref 0–35)
AST: 19 U/L (ref 0–37)
Albumin: 4.4 g/dL (ref 3.5–5.2)
Alkaline Phosphatase: 89 U/L (ref 39–117)
BUN: 16 mg/dL (ref 6–23)
CO2: 29 meq/L (ref 19–32)
Calcium: 9.8 mg/dL (ref 8.4–10.5)
Chloride: 104 meq/L (ref 96–112)
Creatinine, Ser: 0.89 mg/dL (ref 0.40–1.20)
GFR: 62.88 mL/min (ref >60.00)
Glucose, Bld: 87 mg/dL (ref 70–99)
Potassium: 4.1 meq/L (ref 3.5–5.1)
Sodium: 141 meq/L (ref 135–145)
Total Bilirubin: 0.7 mg/dL (ref 0.2–1.2)
Total Protein: 7.8 g/dL (ref 6.0–8.3)

## 2023-12-06 LAB — HEMOGLOBIN A1C: Hgb A1c MFr Bld: 5.9 % (ref 4.6–6.5)

## 2023-12-06 MED ORDER — LINACLOTIDE 145 MCG PO CAPS
145.0000 ug | ORAL_CAPSULE | Freq: Every day | ORAL | 3 refills | Status: AC
Start: 2023-12-06 — End: ?

## 2023-12-06 NOTE — Assessment & Plan Note (Addendum)
Unable to afford linzess. Declined referral to [facility]pharm[facility] referral She opted to use chia seeds and stool softener at this time

## 2023-12-06 NOTE — Progress Notes (Signed)
Established Patient Visit  Patient: Heidi [name]   DOB: [birth year]   78 y.o. 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 Current use of Arimidex due to hx of breast cancer  Irritable bowel syndrome Unable to afford linzess. Declined referral to [facility]-pharmacy referral She opted to use chia seeds and stool softener at this time  Hyperlipidemia Mild aortic atherosclerosis noted on 2019 CXR Repeat lipid panel: LDL at goal Maintain pravastatin dose  Prediabetes Repeat hgbA1c: 5.9%  Encourage to maintain current lifestyle modifications F/up in 6months  Wt Readings from Last 3 Encounters:  12/06/23 177 lb 12.8 oz (80.6 kg)  07/05/23 168 lb (76.2 kg)  05/29/23 165 lb (74.8 kg)    BP Readings from Last 3 Encounters:  12/06/23 128/64  07/05/23 118/70  05/29/23 120/78    Reviewed medical, surgical, and social history today  Medications: Outpatient Medications Prior to Visit  Medication Sig   ALPHA LIPOIC ACID PO Take by mouth at bedtime.   amLODipine (NORVASC) 10 MG tablet TAKE 1 TABLET AT BEDTIME   anastrozole (ARIMIDEX) 1 MG tablet Take 1 mg by mouth daily.   Ascorbic Acid (SM CHEWABLE VITAMIN C) 500 MG CHEW Chew 500 mg by mouth daily.   aspirin 81 MG tablet Take 81 mg by mouth at bedtime.   cetirizine (ZYRTEC) 5 MG tablet Take 1 tablet (5 mg total) by mouth at bedtime.   cholecalciferol (VITAMIN D) 1000 units tablet Take 2 tablets daily.   Coenzyme Q10 (COQ10) 100 MG CAPS Take by mouth at bedtime.   fluticasone (FLONASE) 50 MCG/ACT nasal spray Place 2 sprays into both nostrils daily.   losartan (COZAAR) 100 MG tablet TAKE 1 TABLET DAILY. DOSE  CHANGE    MAGNESIUM PO Take by mouth daily.   Multiple Vitamin (MULTIVITAMIN PO) Take by mouth.   NON FORMULARY CBD Gummy 10 mg prn   pravastatin (PRAVACHOL) 40 MG tablet TAKE 1/2 TABLET DAILY   Probiotic Product (PROBIOTIC PO) 1 tablet.   [DISCONTINUED] linaclotide (LINZESS) 145 MCG CAPS capsule Take 1 capsule (145 mcg total) by mouth daily before breakfast.   [DISCONTINUED] Biotin 10 MG TABS Take by mouth daily. (Patient not taking: Reported on 12/06/2023)   No facility-administered medications prior to visit.   Reviewed past medical and social history.   ROS per HPI above      Objective:  BP 128/64 (BP Location: Right Arm, Patient Position: Sitting, Cuff Size: Normal)   Pulse (!) 57   Temp 98.3 F (36.8 C) (Oral)   Ht 5' 4.75" (1.645 m)   Wt 177 lb 12.8 oz (80.6 kg)   LMP 10/23/1994 (Approximate)   SpO2 100%   BMI 29.82 kg/m      Physical Exam  Results for orders placed or performed in visit on 12/06/23  Hemoglobin A1c  Result Value Ref Range   Hgb A1c MFr Bld 5.9 4.6 - 6.5 %  Comprehensive metabolic panel  Result Value Ref Range   Sodium 141 135 - 145 mEq/L   Potassium 4.1 3.5 - 5.1 mEq/L   Chloride 104 96 - 112 mEq/L   CO2 29 19 - 32 mEq/L  Glucose, Bld 87 70 - 99 mg/dL   BUN 16 6 - 23 mg/dL   Creatinine, Ser 4.69 0.40 - 1.20 mg/dL   Total Bilirubin 0.7 0.2 - 1.2 mg/dL   Alkaline Phosphatase 89 39 - 117 U/L   AST 19 0 - 37 U/L   ALT 12 0 - 35 U/L   Total Protein 7.8 6.0 - 8.3 g/dL   Albumin 4.4 3.5 - 5.2 g/dL   GFR 62.95 >28.41 mL/min   Calcium 9.8 8.4 - 10.5 mg/dL  Lipid panel  Result Value Ref Range   Cholesterol 157 0 - 200 mg/dL   Triglycerides 324.4 0.0 - 149.0 mg/dL   HDL 01.02 >72.53 mg/dL   VLDL 66.4 0.0 - 40.3 mg/dL   LDL Cholesterol 78 0 - 99 mg/dL   Total CHOL/HDL Ratio 3    NonHDL 97.86       Assessment & Plan:    Problem List Items Addressed This Visit     Essential hypertension - Primary   Relevant Orders   Comprehensive metabolic panel  (Completed)   Hyperlipidemia   Mild aortic atherosclerosis noted on 2019 CXR Repeat lipid panel: LDL at goal Maintain pravastatin dose      Relevant Orders   Lipid panel (Completed)   Irritable bowel syndrome   Unable to afford linzess. Declined referral to [facility]pharmacy referral She opted to use chia seeds and stool softener at this time      Relevant Medications   linaclotide (LINZESS) 145 MCG CAPS capsule   Neuroendocrine carcinoma metastatic to intra-abdominal lymph node (HCC)   Mammogram scheduled 02/2024 PET scan scheduled 05/2024 Completed radiation 09/2023. Current use of Arimidex due to hx of breast cancer      Prediabetes   Repeat hgbA1c: 5.9%  Encourage to maintain current lifestyle modifications F/up in 6months      Relevant Orders   Hemoglobin A1c (Completed)   Return in about 6 months (around 06/04/2024) for HTN, hyperlipidemia (fasting).     Alysia Penna, NP

## 2023-12-06 NOTE — Assessment & Plan Note (Signed)
Mammogram scheduled 02/2024 PET scan scheduled 05/2024 Completed radiation 09/2023. Current use of Arimidex due to hx of breast cancer

## 2023-12-06 NOTE — Assessment & Plan Note (Signed)
Repeat hgbA1c: 5.9%  Encourage to maintain current lifestyle modifications F/up in 6months

## 2023-12-06 NOTE — Assessment & Plan Note (Signed)
BP at goal with losartan and amlodipine BP Readings from Last 3 Encounters:  12/06/23 128/64  07/05/23 118/70  05/29/23 120/78    Repeat BMP Maintain med dose

## 2023-12-06 NOTE — Patient Instructions (Signed)
Go to lab Maintain Heart healthy diet and daily exercise. Maintain current medications.

## 2023-12-06 NOTE — Assessment & Plan Note (Signed)
Mild aortic atherosclerosis noted on 2019 CXR Repeat lipid panel: LDL at goal Maintain pravastatin dose

## 2024-01-28 DIAGNOSIS — C7A8 Other malignant neuroendocrine tumors: Secondary | ICD-10-CM | POA: Diagnosis not present

## 2024-01-28 DIAGNOSIS — C7B8 Other secondary neuroendocrine tumors: Secondary | ICD-10-CM | POA: Diagnosis not present

## 2024-02-11 DIAGNOSIS — Z87891 Personal history of nicotine dependence: Secondary | ICD-10-CM | POA: Diagnosis not present

## 2024-02-11 DIAGNOSIS — R933 Abnormal findings on diagnostic imaging of other parts of digestive tract: Secondary | ICD-10-CM | POA: Diagnosis not present

## 2024-02-11 DIAGNOSIS — Z79811 Long term (current) use of aromatase inhibitors: Secondary | ICD-10-CM | POA: Diagnosis not present

## 2024-02-11 DIAGNOSIS — Z79899 Other long term (current) drug therapy: Secondary | ICD-10-CM | POA: Diagnosis not present

## 2024-02-11 DIAGNOSIS — Z85528 Personal history of other malignant neoplasm of kidney: Secondary | ICD-10-CM | POA: Diagnosis not present

## 2024-02-11 DIAGNOSIS — C7B8 Other secondary neuroendocrine tumors: Secondary | ICD-10-CM | POA: Diagnosis not present

## 2024-02-11 DIAGNOSIS — C7A8 Other malignant neuroendocrine tumors: Secondary | ICD-10-CM | POA: Diagnosis not present

## 2024-02-11 DIAGNOSIS — C50919 Malignant neoplasm of unspecified site of unspecified female breast: Secondary | ICD-10-CM | POA: Diagnosis not present

## 2024-02-21 ENCOUNTER — Ambulatory Visit (INDEPENDENT_AMBULATORY_CARE_PROVIDER_SITE_OTHER): Admitting: Nurse Practitioner

## 2024-02-21 ENCOUNTER — Encounter: Payer: Self-pay | Admitting: Nurse Practitioner

## 2024-02-21 VITALS — BP 134/76 | HR 66 | Temp 97.7°F | Ht 64.0 in | Wt 177.2 lb

## 2024-02-21 DIAGNOSIS — J209 Acute bronchitis, unspecified: Secondary | ICD-10-CM

## 2024-02-21 MED ORDER — GUAIFENESIN ER 600 MG PO TB12
600.0000 mg | ORAL_TABLET | Freq: Two times a day (BID) | ORAL | Status: AC | PRN
Start: 2024-02-21 — End: ?

## 2024-02-21 MED ORDER — BENZONATATE 200 MG PO CAPS
200.0000 mg | ORAL_CAPSULE | Freq: Three times a day (TID) | ORAL | 0 refills | Status: DC | PRN
Start: 2024-02-21 — End: 2024-05-26

## 2024-02-21 MED ORDER — AMOXICILLIN-POT CLAVULANATE 875-125 MG PO TABS: 1.0000 | ORAL_TABLET | Freq: Two times a day (BID) | ORAL | 0 refills | Status: DC

## 2024-02-21 NOTE — Patient Instructions (Signed)
 URI Instructions: Encourage adequate oral hydration. Continue mucinex  and zyrtec  You can use plain "Tylenol" or "Advil" for fever, chills and achyness. Call office if no improvement in 1week

## 2024-02-21 NOTE — Progress Notes (Signed)
 Established Patient Visit  Patient: Heidi [name]   DOB: Oct 08, 1946   78 y.o. Female  MRN: [id number] Visit Date: 02/21/2024  Subjective:    Chief Complaint  Patient presents with   Cough    For 1 week, phlegm/mucus does not come out comes up when coughing   Chills and some night sweats    Cough This is a new problem. The current episode started in the past 7 days. The problem has been gradually worsening. The problem occurs constantly. The cough is Non-productive. Associated symptoms include chills, myalgias, nasal congestion, postnasal drip and rhinorrhea. Pertinent negatives include no chest pain, ear congestion, ear pain, fever, headaches, heartburn, hemoptysis, rash, sore throat, shortness of breath, sweats, weight loss or wheezing. The symptoms are aggravated by lying down. She has tried OTC cough suppressant (mucinex , zyrtec  and zicam) for the symptoms. The treatment provided no relief. There is no history of asthma, bronchiectasis, bronchitis, COPD, emphysema or environmental allergies.  Immunocompromised due to Hx of renal and neuroendocrine cancer.  Reviewed medical, surgical, and social history today  Medications: Outpatient Medications Prior to Visit  Medication Sig   amLODipine  (NORVASC ) 10 MG tablet TAKE 1 TABLET AT BEDTIME   anastrozole (ARIMIDEX) 1 MG tablet Take 1 mg by mouth daily.   Ascorbic Acid (SM CHEWABLE VITAMIN C) 500 MG CHEW Chew 500 mg by mouth daily.   aspirin 81 MG tablet Take 81 mg by mouth at bedtime.   cetirizine  (ZYRTEC ) 5 MG tablet Take 1 tablet (5 mg total) by mouth at bedtime.   cholecalciferol (VITAMIN D ) 1000 units tablet Take 2 tablets daily. (Patient taking differently: Take 1,000 Units by mouth every other day. Take 2 tablets daily.)   Coenzyme Q10 (COQ10) 100 MG CAPS Take by mouth at bedtime.   linaclotide  (LINZESS ) 145 MCG CAPS capsule Take 1 capsule (145 mcg total) by mouth daily before breakfast.   losartan  (COZAAR ) 100 MG  tablet TAKE 1 TABLET DAILY. DOSE  CHANGE   MAGNESIUM PO Take by mouth daily.   Multiple Vitamin (MULTIVITAMIN PO) Take by mouth.   NON FORMULARY CBD Gummy 10 mg prn   pravastatin  (PRAVACHOL ) 40 MG tablet TAKE 1/2 TABLET DAILY   Probiotic Product (PROBIOTIC PO) 1 tablet.   ALPHA LIPOIC ACID PO Take by mouth at bedtime.   fluticasone  (FLONASE ) 50 MCG/ACT nasal spray Place 2 sprays into both nostrils daily. (Patient not taking: Reported on 02/21/2024)   No facility-administered medications prior to visit.   Reviewed past medical and social history.   ROS per HPI above      Objective:  BP 134/76 (BP Location: Right Arm, Patient Position: Sitting, Cuff Size: Large)   Pulse 66   Temp 97.7 F (36.5 C) (Temporal)   Ht 5\' 4"  (1.626 m)   Wt 177 lb 3.2 oz (80.4 kg)   LMP 10/23/1994 (Approximate)   SpO2 97%   BMI 30.42 kg/m      Physical Exam Vitals and nursing note reviewed.  Constitutional:      General: She is not in acute distress. Cardiovascular:     Rate and Rhythm: Normal rate and regular rhythm.     Pulses: Normal pulses.     Heart sounds: Normal heart sounds.  Pulmonary:     Effort: Pulmonary effort is normal. No respiratory distress.     Breath sounds: Rhonchi present. No wheezing or rales.  Chest:  Chest wall: No tenderness.  Neurological:     Mental Status: She is alert and oriented to person, place, and time.     No results found for any visits on 02/21/24.    Assessment & Plan:    Problem List Items Addressed This Visit   None Visit Diagnoses       Acute bronchitis, unspecified organism    -  Primary   Relevant Medications   amoxicillin -clavulanate (AUGMENTIN ) 875-125 MG tablet   benzonatate  (TESSALON ) 200 MG capsule   guaiFENesin  (MUCINEX ) 600 MG 12 hr tablet      Return if symptoms worsen or fail to improve.     Kathrene Parents, NP

## 2024-02-25 ENCOUNTER — Ambulatory Visit (INDEPENDENT_AMBULATORY_CARE_PROVIDER_SITE_OTHER): Payer: Medicare HMO

## 2024-02-25 DIAGNOSIS — Z0000 Encounter for general adult medical examination without abnormal findings: Secondary | ICD-10-CM | POA: Diagnosis not present

## 2024-02-25 NOTE — Progress Notes (Signed)
 Subjective:   Heidi [name] is a 78 y.o. who presents for a Medicare Wellness preventive visit.  Visit Complete: Virtual I connected with  Heidi [name] on 02/25/24 by a audio enabled telemedicine application and verified that I am speaking with the correct person using two identifiers.  Patient Location: Home  Provider Location: Office/Clinic  I discussed the limitations of evaluation and management by telemedicine. The patient expressed understanding and agreed to proceed.  Vital Signs: Because this visit was a virtual/telehealth visit, some criteria may be missing or patient reported. Any vitals not documented were not able to be obtained and vitals that have been documented are patient reported.  VideoError- Librarian, academic were attempted between this provider and patient, however failed, due to patient having technical difficulties OR patient did not have access to video capability.  We continued and completed visit with audio only.   Persons Participating in Visit: Patient.  AWV Questionnaire: Yes: Patient Medicare AWV questionnaire was completed by the patient on 02/21/2024; I have confirmed that all information answered by patient is correct and no changes since this date.  Cardiac Risk Factors include: advanced age (>39men, >101 women);dyslipidemia;hypertension     Objective:    Today's Vitals   There is no height or weight on file to calculate BMI.     02/25/2024    8:12 AM 12/11/2022    8:14 AM 12/07/2021    1:37 PM 11/23/2020    2:18 PM 11/19/2019   10:12 AM 11/13/2018   10:19 AM  Advanced Directives  Does Patient Have a Medical Advance Directive? Yes Yes Yes Yes No Yes  Type of Estate agent of [place];Living will Healthcare Power of [place];Living will Healthcare Power of [place];Living will Healthcare Power of [place];Living will  Healthcare Power of [place];Living will  Does patient want to make changes to  medical advance directive?      No - Patient declined  Copy of Healthcare Power of Attorney in Chart? No - copy requested No - copy requested No - copy requested No - copy requested  No - copy requested  Would patient like information on creating a medical advance directive?     No - Patient declined     Current Medications (verified) Outpatient Encounter Medications as of 02/25/2024  Medication Sig   ALPHA LIPOIC ACID PO Take by mouth at bedtime.   amLODipine  (NORVASC ) 10 MG tablet TAKE 1 TABLET AT BEDTIME   amoxicillin -clavulanate (AUGMENTIN ) 875-125 MG tablet Take 1 tablet by mouth 2 (two) times daily.   anastrozole (ARIMIDEX) 1 MG tablet Take 1 mg by mouth daily.   Ascorbic Acid (SM CHEWABLE VITAMIN C) 500 MG CHEW Chew 500 mg by mouth daily.   aspirin 81 MG tablet Take 81 mg by mouth at bedtime.   benzonatate  (TESSALON ) 200 MG capsule Take 1 capsule (200 mg total) by mouth 3 (three) times daily as needed.   cetirizine  (ZYRTEC ) 5 MG tablet Take 1 tablet (5 mg total) by mouth at bedtime.   cholecalciferol (VITAMIN D ) 1000 units tablet Take 2 tablets daily. (Patient taking differently: Take 1,000 Units by mouth every other day. Take 2 tablets daily.)   Coenzyme Q10 (COQ10) 100 MG CAPS Take by mouth at bedtime.   guaiFENesin  (MUCINEX ) 600 MG 12 hr tablet Take 1 tablet (600 mg total) by mouth 2 (two) times daily as needed for cough or to loosen phlegm.   linaclotide  (LINZESS ) 145 MCG CAPS capsule Take 1 capsule (145  mcg total) by mouth daily before breakfast.   losartan  (COZAAR ) 100 MG tablet TAKE 1 TABLET DAILY. DOSE  CHANGE   MAGNESIUM PO Take by mouth daily.   Multiple Vitamin (MULTIVITAMIN PO) Take by mouth.   NON FORMULARY CBD Gummy 10 mg prn   pravastatin  (PRAVACHOL ) 40 MG tablet TAKE 1/2 TABLET DAILY   Probiotic Product (PROBIOTIC PO) 1 tablet.   fluticasone  (FLONASE ) 50 MCG/ACT nasal spray Place 2 sprays into both nostrils daily. (Patient not taking: Reported on 02/25/2024)   No  facility-administered encounter medications on file as of 02/25/2024.    Allergies (verified) Patient has no known allergies.   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 lymph nodes   Atrial fibrillation Brother    Colon cancer Neg Hx    Pancreatic cancer Neg Hx    Stomach cancer Neg Hx    Social History   Socioeconomic History   Marital status: Divorced    Spouse name: Not on file   Number of children: 2   Years of education: Not on file   Highest education level: Bachelor's degree (e.g., BA, AB, BS)  Occupational History   Not on file  Tobacco Use   Smoking status: Former    Current packs/day: 0.00    Average packs/day: 0.3 packs/day for 10.0 years (2.5 ttl pk-yrs)    Types: Cigarettes    Start date: 10/23/1973    Quit date: 10/24/1983    Years since quitting: 40.3   Smokeless tobacco: Never  Vaping Use   Vaping status: Never Used  Substance and Sexual  Activity   Alcohol use: Not Currently   Drug use: No   Sexual activity: Not Currently    Partners: Male    Birth control/protection: Surgical    Comment: hysterectomy, older than 16, more than 5  Other Topics Concern   Not on file  Social History Narrative   Exercises 3x per week   Caffeine use: 2 cups per day   3 siblings healthy   Social Drivers of Health   Financial Resource Strain: Low Risk  (02/25/2024)   Overall Financial Resource Strain (CARDIA)    Difficulty of Paying Living Expenses: Not hard at  all  Food Insecurity: No Food Insecurity (02/25/2024)   Hunger Vital Sign    Worried About Running Out of Food in the Last Year: Never true    Ran Out of Food in the Last Year: Never true  Transportation Needs: No Transportation Needs (02/25/2024)   PRAPARE - Administrator, Civil Service (Medical): No    Lack of Transportation (Non-Medical): No  Physical Activity: Sufficiently Active (02/25/2024)   Exercise Vital Sign    Days of Exercise per Week: 5 days    Minutes of Exercise per Session: 60 min  Stress: No Stress Concern Present (12/04/2023)   [employer] of Occupational Health - Occupational Stress Questionnaire    Feeling of Stress : Only a little  Social Connections: Socially Isolated (02/25/2024)   Social Connection and Isolation Panel [NHANES]    Frequency of Communication with Friends and Family: More than three times a week    Frequency of Social Gatherings with Friends and Family: More than three times a week    Attends Religious Services: Never    Database administrator or Organizations: No    Attends Engineer, structural: Never    Marital Status: Divorced    Tobacco Counseling Counseling given: Not Answered    Clinical Intake:  Pre-visit preparation completed: Yes  Pain : No/denies pain     Nutritional Risks: None Diabetes: No  Lab Results  Component Value Date   HGBA1C 5.9 12/06/2023   HGBA1C 5.4 05/29/2023   HGBA1C 5.4  05/29/2023     How often do you need to have someone help you when you read instructions, pamphlets, or other written materials from your doctor or pharmacy?: 1 - Never  Interpreter Needed?: No  Information entered by :: NAllen LPN   Activities of Daily Living     02/21/2024    9:29 AM 12/10/2023    9:57 AM  In your present state of health, do you have any difficulty performing the following activities:  Hearing? 0 0  Vision? 0 0  Difficulty concentrating or making decisions? 0 0  Walking or climbing stairs? 0 0  Dressing or bathing? 0 0  Doing errands, shopping? 0 0  Preparing Food and eating ? N N  Using the Toilet? N N  In the past six months, have you accidently leaked urine? N N  Do you have problems with loss of bowel control? N N  Managing your Medications? N N  Managing your Finances? N N  Housekeeping or managing your Housekeeping? N N    Patient Care Team: Nche, Connye Delaine, NP as PCP - General (Internal Medicine) Reinaldo Caras, MD as Consulting Physician (Obstetrics and Gynecology)  Indicate any recent Medical Services you may have received from other than [facility] providers in the past year (date may be approximate).     Assessment:   This is a routine wellness examination for [place].  Hearing/Vision screen Hearing Screening - Comments:: Denies hearing issues Vision Screening - Comments:: Regular eye exams, [facility]   Goals Addressed             This Visit's Progress    Patient Stated       02/25/2024, keep weight down       Depression Screen     02/25/2024    8:13 AM 12/06/2023    8:58 AM 12/11/2022    8:15 AM 11/28/2022    9:04 AM 12/07/2021    1:37 PM 11/23/2020    2:21  PM 11/19/2019   10:14 AM  PHQ 2/9 Scores  PHQ - 2 Score 0 0 0 0 0 0 0  PHQ- 9 Score 2          Fall Risk     02/21/2024    9:29 AM 12/10/2023    9:57 AM 12/06/2023    8:58 AM 07/05/2023    9:15 AM 12/11/2022    8:15 AM  Fall Risk   Falls in the past year? 0 0 0 0 0  Number  falls in past yr: 0  0 0 0  Injury with Fall? 0  0 0 0  Risk for fall due to : Medication side effect  No Fall Risks No Fall Risks Medication side effect  Follow up Falls prevention discussed;Falls evaluation completed  Falls evaluation completed Falls evaluation completed Falls prevention discussed;Education provided;Falls evaluation completed    MEDICARE RISK AT HOME:  Medicare Risk at Home Any stairs in or around the home?: (Patient-Rptd) No If so, are there any without handrails?: (Patient-Rptd) No Home free of loose throw rugs in walkways, pet beds, electrical cords, etc?: (Patient-Rptd) Yes Adequate lighting in your home to reduce risk of falls?: (Patient-Rptd) Yes Life alert?: (Patient-Rptd) No Use of a cane, walker or w/c?: (Patient-Rptd) No Grab bars in the bathroom?: (Patient-Rptd) No Shower chair or bench in shower?: (Patient-Rptd) No Elevated toilet seat or a handicapped toilet?: (Patient-Rptd) No  TIMED UP AND GO:  Was the test performed?  No  Cognitive Function: 6CIT completed    11/13/2018   10:21 AM  MMSE - Mini Mental State Exam  Orientation to time 5  Orientation to Place 5  Registration 3  Attention/ Calculation 5  Recall 3  Language- name 2 objects 2  Language- repeat 1  Language- follow 3 step command 3  Language- read & follow direction 1  Write a sentence 1  Copy design 1  Total score 30        02/25/2024    8:14 AM 12/11/2022    8:16 AM  6CIT Screen  What Year? 0 points 0 points  What month? 0 points 0 points  What time? 0 points 0 points  Count back from 20 0 points 0 points  Months in reverse 0 points 0 points  Repeat phrase 0 points 0 points  Total Score 0 points 0 points    Immunizations Immunization History  Administered Date(s) Administered   Fluad Quad(high Dose 65+) 06/19/2019, 06/22/2022   Influenza Split 10/14/2009, 10/12/2010, 10/12/2011, 10/11/2012   Influenza Whole 10/14/2009, 10/12/2010   Influenza, High Dose Seasonal PF  06/19/2019, 06/22/2022, 06/19/2023   Influenza,inj,Quad PF,6+ Mos 08/27/2013, 10/14/2014, 11/24/2015, 10/02/2016   Influenza-Unspecified 10/12/2011, 10/11/2012, 07/23/2018, 06/19/2019, 06/24/2020, 07/12/2021, 06/19/2023   Moderna Covid-19 Fall Seasonal Vaccine 21yrs & older 07/19/2022, 06/20/2023, 12/28/2023   Moderna Covid-19 Vaccine Bivalent Booster 44yrs & up 01/22/2021, 03/06/2022   Moderna Sars-Covid-2 Vaccination 11/23/2019, 12/21/2019, 08/17/2020, 01/22/2021, 03/06/2022   PNEUMOCOCCAL CONJUGATE-20 11/04/2021   Pfizer Covid-19 Vaccine Bivalent Booster 8yrs & up 07/12/2021   Pneumococcal Conjugate-13 12/11/2016   Pneumococcal Polysaccharide-23 04/11/2012, 08/17/2020   Respiratory Syncytial Virus Vaccine,Recomb Aduvanted(Arexvy) 07/19/2022   Td 11/23/2010   Td,absorbed, Preservative Free, Adult Use, Lf Unspecified 11/23/2010   Tdap 03/08/2021   Unspecified SARS-COV-2 Vaccination 07/19/2022   Zoster Recombinant(Shingrix) 07/20/2021, 10/26/2021    Screening Tests Health Maintenance  Topic Date Due   MAMMOGRAM  09/07/2023   COVID-19 Vaccine (11 - 2024-25 season) 02/22/2024   INFLUENZA VACCINE  05/23/2024  Medicare Annual Wellness (AWV)  02/24/2025   DTaP/Tdap/Td (4 - Td or Tdap) 03/09/2031   Pneumonia Vaccine 73+ Years old  Completed   DEXA SCAN  Completed   Hepatitis C Screening  Completed   Zoster Vaccines- Shingrix  Completed   HPV VACCINES  Aged Out   Meningococcal B Vaccine  Aged Out   Colonoscopy  Discontinued    Health Maintenance  Health Maintenance Due  Topic Date Due   MAMMOGRAM  09/07/2023   COVID-19 Vaccine (11 - 2024-25 season) 02/22/2024   Health Maintenance Items Addressed: Mammogram scheduled for end of the month  Additional Screening:  Vision Screening: Recommended annual ophthalmology exams for early detection of glaucoma and other disorders of the eye.  Dental Screening: Recommended annual dental exams for proper oral hygiene  Community  Resource Referral / Chronic Care Management: CRR required this visit?  No   CCM required this visit?  No     Plan:     I have personally reviewed and noted the following in the patient's chart:   Medical and social history Use of alcohol, tobacco or illicit drugs  Current medications and supplements including opioid prescriptions. Patient is not currently taking opioid prescriptions. Functional ability and status Nutritional status Physical activity Advanced directives List of other physicians Hospitalizations, surgeries, and ER visits in previous 12 months Vitals Screenings to include cognitive, depression, and falls Referrals and appointments  In addition, I have reviewed and discussed with patient certain preventive protocols, quality metrics, and best practice recommendations. A written personalized care plan for preventive services as well as general preventive health recommendations were provided to patient.     Areatha Beecham, LPN   10/28/1094   After Visit Summary: (MyChart) Due to this being a telephonic visit, the after visit summary with patients personalized plan was offered to patient via MyChart   Notes: Nothing significant to report at this time.

## 2024-02-25 NOTE — Patient Instructions (Signed)
 Heidi [name] , Thank you for taking time to come for your Medicare Wellness Visit. I appreciate your ongoing commitment to your health goals. Please review the following plan we discussed and let me know if I can assist you in the future.   Referrals/Orders/Follow-Ups/Clinician Recommendations: none  This is a list of the screening recommended for you and due dates:  Health Maintenance  Topic Date Due   Mammogram  09/07/2023   COVID-19 Vaccine (11 - 2024-25 season) 02/22/2024   Flu Shot  05/23/2024   Medicare Annual Wellness Visit  02/24/2025   DTaP/Tdap/Td vaccine (4 - Td or Tdap) 03/09/2031   Pneumonia Vaccine  Completed   DEXA scan (bone density measurement)  Completed   Hepatitis C Screening  Completed   Zoster (Shingles) Vaccine  Completed   HPV Vaccine  Aged Out   Meningitis B Vaccine  Aged Out   Colon Cancer Screening  Discontinued    Advanced directives: (Copy Requested) Please bring a copy of your health care power of attorney and living will to the office to be added to your chart at your convenience. You can mail to [facility] [address] [place], [place] [address] or email to [email or web address]  Next Medicare Annual Wellness Visit scheduled for next year: Yes  Have you seen your provider in the last 6 months (3 months if uncontrolled diabetes)? Yes, appointment on 05/26/2024  insert Preventive Care attachment Insert FALL PREVENTION attachment if needed

## 2024-02-27 ENCOUNTER — Encounter: Payer: Self-pay | Admitting: Nurse Practitioner

## 2024-03-21 DIAGNOSIS — Z87891 Personal history of nicotine dependence: Secondary | ICD-10-CM | POA: Diagnosis not present

## 2024-03-21 DIAGNOSIS — Z85060 Personal history of malignant carcinoid tumor of small intestine: Secondary | ICD-10-CM | POA: Diagnosis not present

## 2024-03-21 DIAGNOSIS — Z08 Encounter for follow-up examination after completed treatment for malignant neoplasm: Secondary | ICD-10-CM | POA: Diagnosis not present

## 2024-03-21 DIAGNOSIS — Z853 Personal history of malignant neoplasm of breast: Secondary | ICD-10-CM | POA: Diagnosis not present

## 2024-03-21 DIAGNOSIS — C50412 Malignant neoplasm of upper-outer quadrant of left female breast: Secondary | ICD-10-CM | POA: Diagnosis not present

## 2024-03-21 DIAGNOSIS — Z90710 Acquired absence of both cervix and uterus: Secondary | ICD-10-CM | POA: Diagnosis not present

## 2024-03-21 DIAGNOSIS — Z79811 Long term (current) use of aromatase inhibitors: Secondary | ICD-10-CM | POA: Diagnosis not present

## 2024-03-21 DIAGNOSIS — Z87440 Personal history of urinary (tract) infections: Secondary | ICD-10-CM | POA: Diagnosis not present

## 2024-03-21 DIAGNOSIS — Z9079 Acquired absence of other genital organ(s): Secondary | ICD-10-CM | POA: Diagnosis not present

## 2024-03-21 DIAGNOSIS — Z90722 Acquired absence of ovaries, bilateral: Secondary | ICD-10-CM | POA: Diagnosis not present

## 2024-03-21 DIAGNOSIS — Z800 Family history of malignant neoplasm of digestive organs: Secondary | ICD-10-CM | POA: Diagnosis not present

## 2024-03-21 DIAGNOSIS — Z8042 Family history of malignant neoplasm of prostate: Secondary | ICD-10-CM | POA: Diagnosis not present

## 2024-03-21 DIAGNOSIS — Z9889 Other specified postprocedural states: Secondary | ICD-10-CM | POA: Diagnosis not present

## 2024-03-21 DIAGNOSIS — Z85528 Personal history of other malignant neoplasm of kidney: Secondary | ICD-10-CM | POA: Diagnosis not present

## 2024-03-21 DIAGNOSIS — Z803 Family history of malignant neoplasm of breast: Secondary | ICD-10-CM | POA: Diagnosis not present

## 2024-03-21 DIAGNOSIS — Z780 Asymptomatic menopausal state: Secondary | ICD-10-CM | POA: Diagnosis not present

## 2024-03-26 ENCOUNTER — Encounter: Payer: Self-pay | Admitting: Nurse Practitioner

## 2024-03-31 ENCOUNTER — Other Ambulatory Visit: Payer: Self-pay | Admitting: Nurse Practitioner

## 2024-03-31 DIAGNOSIS — E782 Mixed hyperlipidemia: Secondary | ICD-10-CM

## 2024-03-31 DIAGNOSIS — I10 Essential (primary) hypertension: Secondary | ICD-10-CM

## 2024-04-02 ENCOUNTER — Other Ambulatory Visit: Payer: Self-pay | Admitting: Nurse Practitioner

## 2024-04-02 DIAGNOSIS — K581 Irritable bowel syndrome with constipation: Secondary | ICD-10-CM

## 2024-04-02 NOTE — Telephone Encounter (Signed)
 Copied from CRM [id number]. Topic: Clinical - Medication Refill >> Apr 02, 2024  9:40 AM Dorisann Garre T wrote: Medication: linaclotide  (LINZESS ) 145 MCG CAPS capsule [[id number]]  Has the patient contacted their pharmacy? yes (Agent: If no, request that the patient contact the pharmacy for the refill. If patient does not wish to contact the pharmacy document the reason why and proceed with request.) (Agent: If yes, when and what did the pharmacy advise?)  This is the patient's preferred pharmacy:   [facility] [address], [place] - [address] [place] [address] Phone: [phone] Fax: [phone]  Is this the correct pharmacy for this prescription? Yes If no, delete pharmacy and type the correct one.   Has the prescription been filled recently? No  Is the patient out of the medication? Yes  Has the patient been seen for an appointment in the last year OR does the patient have an upcoming appointment? Yes  Can we respond through MyChart? Yes  Agent: Please be advised that Rx refills may take up to 3 business days. We ask that you follow-up with your pharmacy.

## 2024-04-03 NOTE — Telephone Encounter (Signed)
 Medication refill request received for linaclotide  (LINZESS ) 145 MCG CAPS capsule . RF declined. Request refill too soon. 1 year supply sent 12/06/23.

## 2024-04-07 MED ORDER — LINACLOTIDE 145 MCG PO CAPS: 145.0000 ug | ORAL_CAPSULE | Freq: Every day | ORAL | 0 refills | Status: DC

## 2024-04-07 NOTE — Addendum Note (Signed)
 Addended by: Karlene Overcast on: 04/07/2024 03:32 PM   Modules accepted: Orders

## 2024-04-28 DIAGNOSIS — Z79811 Long term (current) use of aromatase inhibitors: Secondary | ICD-10-CM | POA: Diagnosis not present

## 2024-04-28 DIAGNOSIS — C7A8 Other malignant neuroendocrine tumors: Secondary | ICD-10-CM | POA: Diagnosis not present

## 2024-04-28 DIAGNOSIS — R9389 Abnormal findings on diagnostic imaging of other specified body structures: Secondary | ICD-10-CM | POA: Diagnosis not present

## 2024-04-28 DIAGNOSIS — Z853 Personal history of malignant neoplasm of breast: Secondary | ICD-10-CM | POA: Diagnosis not present

## 2024-04-28 DIAGNOSIS — Z08 Encounter for follow-up examination after completed treatment for malignant neoplasm: Secondary | ICD-10-CM | POA: Diagnosis not present

## 2024-04-28 DIAGNOSIS — Z79899 Other long term (current) drug therapy: Secondary | ICD-10-CM | POA: Diagnosis not present

## 2024-04-28 DIAGNOSIS — Z905 Acquired absence of kidney: Secondary | ICD-10-CM | POA: Diagnosis not present

## 2024-04-28 DIAGNOSIS — R970 Elevated carcinoembryonic antigen [CEA]: Secondary | ICD-10-CM | POA: Diagnosis not present

## 2024-04-28 DIAGNOSIS — C7B8 Other secondary neuroendocrine tumors: Secondary | ICD-10-CM | POA: Diagnosis not present

## 2024-04-28 DIAGNOSIS — Z87891 Personal history of nicotine dependence: Secondary | ICD-10-CM | POA: Diagnosis not present

## 2024-04-28 DIAGNOSIS — Z9049 Acquired absence of other specified parts of digestive tract: Secondary | ICD-10-CM | POA: Diagnosis not present

## 2024-04-28 DIAGNOSIS — Z90710 Acquired absence of both cervix and uterus: Secondary | ICD-10-CM | POA: Diagnosis not present

## 2024-04-28 DIAGNOSIS — K6389 Other specified diseases of intestine: Secondary | ICD-10-CM | POA: Diagnosis not present

## 2024-04-28 DIAGNOSIS — M47816 Spondylosis without myelopathy or radiculopathy, lumbar region: Secondary | ICD-10-CM | POA: Diagnosis not present

## 2024-04-28 DIAGNOSIS — R7989 Other specified abnormal findings of blood chemistry: Secondary | ICD-10-CM | POA: Diagnosis not present

## 2024-04-28 DIAGNOSIS — Z803 Family history of malignant neoplasm of breast: Secondary | ICD-10-CM | POA: Diagnosis not present

## 2024-05-26 ENCOUNTER — Ambulatory Visit (INDEPENDENT_AMBULATORY_CARE_PROVIDER_SITE_OTHER): Admitting: Nurse Practitioner

## 2024-05-26 ENCOUNTER — Encounter: Payer: Self-pay | Admitting: Nurse Practitioner

## 2024-05-26 VITALS — BP 130/72 | HR 62 | Temp 98.2°F | Ht 65.0 in | Wt 182.8 lb

## 2024-05-26 DIAGNOSIS — I10 Essential (primary) hypertension: Secondary | ICD-10-CM | POA: Diagnosis not present

## 2024-05-26 DIAGNOSIS — R7303 Prediabetes: Secondary | ICD-10-CM

## 2024-05-26 LAB — POCT GLYCOSYLATED HEMOGLOBIN (HGB A1C)
HbA1c POC (<> result, manual entry): 5.6 % (ref 4.0–5.6)
Hemoglobin A1C: 5.6 % (ref 4.0–5.6)

## 2024-05-26 NOTE — Assessment & Plan Note (Addendum)
 Repeat hgbA1c: 5.6% improved

## 2024-05-26 NOTE — Progress Notes (Signed)
 Established Patient Visit  Patient: Heidi [name]   DOB: December 24, 1945   78 y.o. Female  MRN: [id number] Visit Date: 05/26/2024  Subjective:    Chief Complaint  Patient presents with   Follow-up    FASTING-Patient requested A1c check    HPI Essential hypertension BP at goal with losartan  and amlodipine  Reviewed CMP completed by oncology 04/28/2024: stable renal and liver function, but elevated glucose due to non fasting state. BP Readings from Last 3 Encounters:  05/26/24 130/72  02/21/24 134/76  12/06/23 128/64    Maintain med doses F/up in 6months  Prediabetes Repeat hgbA1c: 5.6% improved  Wt Readings from Last 3 Encounters:  05/26/24 182 lb 12.8 oz (82.9 kg)  02/21/24 177 lb 3.2 oz (80.4 kg)  12/06/23 177 lb 12.8 oz (80.6 kg)     Reviewed medical, surgical, and social history today  Medications: Outpatient Medications Prior to Visit  Medication Sig   ALPHA LIPOIC ACID PO Take by mouth at bedtime.   amLODipine  (NORVASC ) 10 MG tablet TAKE 1 TABLET AT BEDTIME   anastrozole (ARIMIDEX) 1 MG tablet Take 1 mg by mouth daily.   aspirin 81 MG tablet Take 81 mg by mouth at bedtime.   cetirizine  (ZYRTEC ) 5 MG tablet Take 1 tablet (5 mg total) by mouth at bedtime.   Coenzyme Q10 (COQ10) 100 MG CAPS Take by mouth at bedtime.   guaiFENesin  (MUCINEX ) 600 MG 12 hr tablet Take 1 tablet (600 mg total) by mouth 2 (two) times daily as needed for cough or to loosen phlegm.   linaclotide  (LINZESS ) 145 MCG CAPS capsule Take 1 capsule (145 mcg total) by mouth daily before breakfast.   losartan  (COZAAR ) 100 MG tablet TAKE 1 TABLET DAILY (DOSE  CHANGE)   MAGNESIUM PO Take by mouth daily.   Multiple Vitamin (MULTIVITAMIN PO) Take by mouth.   NON FORMULARY CBD Gummy 10 mg prn   pravastatin  (PRAVACHOL ) 40 MG tablet TAKE 1/2 TABLET DAILY   Probiotic Product (PROBIOTIC PO) 1 tablet.   Ascorbic Acid (SM CHEWABLE VITAMIN C) 500 MG CHEW Chew 500 mg by mouth daily.   [DISCONTINUED]  amoxicillin -clavulanate (AUGMENTIN ) 875-125 MG tablet Take 1 tablet by mouth 2 (two) times daily. (Patient not taking: Reported on 05/26/2024)   [DISCONTINUED] benzonatate  (TESSALON ) 200 MG capsule Take 1 capsule (200 mg total) by mouth 3 (three) times daily as needed. (Patient not taking: Reported on 05/26/2024)   [DISCONTINUED] cholecalciferol (VITAMIN D ) 1000 units tablet Take 2 tablets daily. (Patient not taking: Reported on 05/26/2024)   [DISCONTINUED] fluticasone  (FLONASE ) 50 MCG/ACT nasal spray Place 2 sprays into both nostrils daily. (Patient not taking: Reported on 02/25/2024)   No facility-administered medications prior to visit.   Reviewed past medical and social history.   ROS per HPI above      Objective:  BP 130/72 (BP Location: Left Arm, Patient Position: Sitting, Cuff Size: Normal)   Pulse 62   Temp 98.2 F (36.8 C) (Oral)   Ht 5' 5 (1.651 m)   Wt 182 lb 12.8 oz (82.9 kg)   LMP 10/23/1994 (Approximate)   SpO2 98%   BMI 30.42 kg/m      Physical Exam Vitals and nursing note reviewed.  Cardiovascular:     Rate and Rhythm: Normal rate.     Pulses: Normal pulses.  Pulmonary:     Effort: Pulmonary effort is normal.  Neurological:     Mental Status:  She is alert and oriented to person, place, and time.     Results for orders placed or performed in visit on 05/26/24  POCT glycosylated hemoglobin (Hb A1C)  Result Value Ref Range   Hemoglobin A1C 5.6 4.0 - 5.6 %   HbA1c POC (<> result, manual entry) 5.6 4.0 - 5.6 %   HbA1c, POC (prediabetic range)     HbA1c, POC (controlled diabetic range)        Assessment & Plan:    Problem List Items Addressed This Visit     Essential hypertension - Primary   BP at goal with losartan  and amlodipine  Reviewed CMP completed by oncology 04/28/2024: stable renal and liver function, but elevated glucose due to non fasting state. BP Readings from Last 3 Encounters:  05/26/24 130/72  02/21/24 134/76  12/06/23 128/64    Maintain  med doses F/up in 6months      Prediabetes   Repeat hgbA1c: 5.6% improved      Relevant Orders   POCT glycosylated hemoglobin (Hb A1C) (Completed)   Return in about 6 months (around 11/26/2024) for HTN, prediabetes, hyperlipidemia (fasting).     Roselie Mood, NP

## 2024-05-26 NOTE — Patient Instructions (Addendum)
 Hgba1c at 5.6% Maintain Heart healthy diet and daily exercise. Maintain current medications.

## 2024-05-26 NOTE — Assessment & Plan Note (Signed)
 BP at goal with losartan  and amlodipine  Reviewed CMP completed by oncology 04/28/2024: stable renal and liver function, but elevated glucose due to non fasting state. BP Readings from Last 3 Encounters:  05/26/24 130/72  02/21/24 134/76  12/06/23 128/64    Maintain med doses F/up in 6months

## 2024-05-26 NOTE — Assessment & Plan Note (Deleted)
 BP at goal with losartan  and amlodipine  Reviewed CMP completed by oncology 04/28/2024: stable renal and liver function, but elevated glucose due to non fasting state. BP Readings from Last 3 Encounters:  05/26/24 130/72  02/21/24 134/76  12/06/23 128/64    Maintain med doses F/up in 6months

## 2024-07-01 ENCOUNTER — Encounter: Payer: Self-pay | Admitting: Nurse Practitioner

## 2024-07-07 ENCOUNTER — Encounter: Payer: Medicare HMO | Admitting: Obstetrics and Gynecology

## 2024-07-10 DIAGNOSIS — H524 Presbyopia: Secondary | ICD-10-CM | POA: Diagnosis not present

## 2024-07-10 DIAGNOSIS — H5213 Myopia, bilateral: Secondary | ICD-10-CM | POA: Diagnosis not present

## 2024-07-10 DIAGNOSIS — H52223 Regular astigmatism, bilateral: Secondary | ICD-10-CM | POA: Diagnosis not present

## 2024-08-04 DIAGNOSIS — C251 Malignant neoplasm of body of pancreas: Secondary | ICD-10-CM | POA: Diagnosis not present

## 2024-08-04 DIAGNOSIS — C7A8 Other malignant neuroendocrine tumors: Secondary | ICD-10-CM | POA: Diagnosis not present

## 2024-08-04 DIAGNOSIS — C7B8 Other secondary neuroendocrine tumors: Secondary | ICD-10-CM | POA: Diagnosis not present

## 2024-08-11 DIAGNOSIS — Z905 Acquired absence of kidney: Secondary | ICD-10-CM | POA: Diagnosis not present

## 2024-08-11 DIAGNOSIS — I2510 Atherosclerotic heart disease of native coronary artery without angina pectoris: Secondary | ICD-10-CM | POA: Diagnosis not present

## 2024-08-11 DIAGNOSIS — Z9049 Acquired absence of other specified parts of digestive tract: Secondary | ICD-10-CM | POA: Diagnosis not present

## 2024-08-11 DIAGNOSIS — K429 Umbilical hernia without obstruction or gangrene: Secondary | ICD-10-CM | POA: Diagnosis not present

## 2024-08-11 DIAGNOSIS — C7B8 Other secondary neuroendocrine tumors: Secondary | ICD-10-CM | POA: Diagnosis not present

## 2024-08-11 DIAGNOSIS — M4807 Spinal stenosis, lumbosacral region: Secondary | ICD-10-CM | POA: Diagnosis not present

## 2024-08-11 DIAGNOSIS — C7A8 Other malignant neuroendocrine tumors: Secondary | ICD-10-CM | POA: Diagnosis not present

## 2024-08-18 DIAGNOSIS — C7B8 Other secondary neuroendocrine tumors: Secondary | ICD-10-CM | POA: Diagnosis not present

## 2024-08-18 DIAGNOSIS — C7A8 Other malignant neuroendocrine tumors: Secondary | ICD-10-CM | POA: Diagnosis not present

## 2024-08-27 DIAGNOSIS — D3A011 Benign carcinoid tumor of the jejunum: Secondary | ICD-10-CM | POA: Diagnosis not present

## 2024-08-27 DIAGNOSIS — Z853 Personal history of malignant neoplasm of breast: Secondary | ICD-10-CM | POA: Diagnosis not present

## 2024-08-27 DIAGNOSIS — K660 Peritoneal adhesions (postprocedural) (postinfection): Secondary | ICD-10-CM | POA: Diagnosis not present

## 2024-08-27 DIAGNOSIS — D3A8 Other benign neuroendocrine tumors: Secondary | ICD-10-CM | POA: Diagnosis not present

## 2024-08-27 DIAGNOSIS — Z7982 Long term (current) use of aspirin: Secondary | ICD-10-CM | POA: Diagnosis not present

## 2024-08-27 DIAGNOSIS — C7A8 Other malignant neuroendocrine tumors: Secondary | ICD-10-CM | POA: Diagnosis not present

## 2024-08-27 DIAGNOSIS — Z9889 Other specified postprocedural states: Secondary | ICD-10-CM | POA: Diagnosis not present

## 2024-08-27 DIAGNOSIS — Z85528 Personal history of other malignant neoplasm of kidney: Secondary | ICD-10-CM | POA: Diagnosis not present

## 2024-08-27 DIAGNOSIS — Z9049 Acquired absence of other specified parts of digestive tract: Secondary | ICD-10-CM | POA: Diagnosis not present

## 2024-08-27 DIAGNOSIS — R140 Abdominal distension (gaseous): Secondary | ICD-10-CM | POA: Diagnosis not present

## 2024-08-27 DIAGNOSIS — K567 Ileus, unspecified: Secondary | ICD-10-CM | POA: Diagnosis not present

## 2024-09-15 DIAGNOSIS — C7A8 Other malignant neuroendocrine tumors: Secondary | ICD-10-CM | POA: Diagnosis not present

## 2024-09-15 DIAGNOSIS — I82411 Acute embolism and thrombosis of right femoral vein: Secondary | ICD-10-CM | POA: Diagnosis not present

## 2024-09-15 DIAGNOSIS — I82431 Acute embolism and thrombosis of right popliteal vein: Secondary | ICD-10-CM | POA: Diagnosis not present

## 2024-09-24 DIAGNOSIS — I82411 Acute embolism and thrombosis of right femoral vein: Secondary | ICD-10-CM | POA: Diagnosis not present

## 2024-09-29 DIAGNOSIS — I82411 Acute embolism and thrombosis of right femoral vein: Secondary | ICD-10-CM | POA: Diagnosis not present

## 2024-09-29 DIAGNOSIS — C7A8 Other malignant neuroendocrine tumors: Secondary | ICD-10-CM | POA: Diagnosis not present

## 2024-10-05 ENCOUNTER — Other Ambulatory Visit: Payer: Self-pay

## 2024-10-05 ENCOUNTER — Ambulatory Visit
Admission: RE | Admit: 2024-10-05 | Discharge: 2024-10-05 | Disposition: A | Discharge status: Home / Self Care | Source: Ambulatory Visit

## 2024-10-05 VITALS — BP 167/89 | HR 93 | Temp 101.2°F | Resp 18 | Ht 65.0 in | Wt 173.0 lb

## 2024-10-05 DIAGNOSIS — J069 Acute upper respiratory infection, unspecified: Secondary | ICD-10-CM

## 2024-10-05 DIAGNOSIS — J101 Influenza due to other identified influenza virus with other respiratory manifestations: Secondary | ICD-10-CM

## 2024-10-05 LAB — POC COVID19/FLU A&B COMBO
Covid Antigen, POC: NEGATIVE
Influenza A Antigen, POC: POSITIVE — AB
Influenza B Antigen, POC: NEGATIVE

## 2024-10-05 MED ORDER — BENZONATATE 100 MG PO CAPS: 100.0000 mg | ORAL_CAPSULE | Freq: Three times a day (TID) | ORAL | 0 refills | Status: DC

## 2024-10-05 MED ORDER — ACETAMINOPHEN 325 MG PO TABS
650.0000 mg | ORAL_TABLET | Freq: Once | ORAL | Status: AC
  Administered 2024-10-05: 650 mg via ORAL

## 2024-10-05 NOTE — ED Provider Notes (Signed)
 [facility]    CSN: [id number] Arrival date & time: 10/05/24  1327      History   Chief Complaint Chief Complaint  Patient presents with   Headache    cough temperature - Entered by patient   Fever    HPI Heidi [name] is a 78 y.o. female.  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   HPI  Discussed the use of AI scribe software for clinical note transcription with the patient, who gave verbal consent to proceed.  The patient presents with symptoms of a cold, including headache, cough, and sore throat.  Symptoms began on Friday, October 03, 2024, with a headache, cough producing phlegm, and a sore throat. She has been using Mucinex  and gargling with salt water for relief. She reports no trouble breathing or wheezing, but there is a slight sensation in the ears without pain. No nausea, vomiting, or diarrhea. She experiences body aches, particularly in the knees, and has had a fever since symptom onset.  She has been taking Mucinex , cold and sinus medication, and an expired prescription of Tessalon  Perles for the cough, which she finds helpful.  There has been no recent travel, and no one else in her household is experiencing similar symptoms.  She mentions a recent history of a blood clot in her leg and a surgery about a month and a half ago, which required hospitalization and was associated with vomiting.    Past Medical History:  Diagnosis Date   Anxiety    Breast cancer (HCC)    Chronic rhinitis    DJD (degenerative  joint disease)    GERD (gastroesophageal reflux disease)    History of headache    HTN (hypertension)    Hypercholesterolemia    IBS (irritable bowel syndrome)    Malignant neoplasm of kidney excluding renal pelvis (HCC) 01/11/2008   Qualifier: History of  By: Christi MD, Scott M    Mesenteric mass 06/08/2016   Recurrent UTI 10/07/2021   Renal cell carcinoma 2001   Small bowel mass    Subepithelial gastric mass 06/05/2019   Venous insufficiency     Patient Active Problem List   Diagnosis Date Noted   Prediabetes 04/20/2022   S/p nephrectomy 04/20/2022   Aromatase inhibitor use 10/07/2021   Renal cell carcinoma (HCC) 05/25/2021   Breast cancer (HCC) 05/09/2021   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   Heel spur 04/19/2015   Osteoarthritis 01/11/2008   Hyperlipidemia 01/02/2008   Essential hypertension 01/02/2008   Venous (peripheral) insufficiency 01/02/2008   Irritable bowel syndrome 01/02/2008    Past Surgical History:  Procedure Laterality Date   APPENDECTOMY  07/2019   BREAST SURGERY Left 05/23/2021   Breast Cancer lump ectomy   CHOLECYSTECTOMY  07/2019   NEPHRECTOMY  2001   right = for renal cell CA   small bowel mass removal     SMALL INTESTINE SURGERY  07/2019   TOTAL ABDOMINAL HYSTERECTOMY     TUBAL LIGATION      OB History     Gravida  3   Para  2   Term  2   Preterm  0   AB  1   Living  2      SAB  1   IAB  0   Ectopic  0   Multiple  0   Live Births  2            Home Medications    Prior to Admission medications  Medication Sig Start Date End Date Taking? Authorizing Provider  apixaban (ELIQUIS) 5 MG TABS tablet Take 5 mg by mouth. 09/29/24  Yes [provider]  benzonatate  (TESSALON ) 100 MG capsule Take 1 capsule (100 mg total) by mouth every 8 (eight) hours. 10/05/24  Yes Pocahontas Cohenour E, PA-C  ALPHA LIPOIC ACID PO Take by mouth at bedtime.    [provider]   amLODipine  (NORVASC ) 10 MG tablet TAKE 1 TABLET AT BEDTIME 04/02/24   Nche, Charlotte Lum, NP  anastrozole (ARIMIDEX) 1 MG tablet Take 1 mg by mouth daily. 07/08/22   [provider]  Ascorbic Acid (SM CHEWABLE VITAMIN C) 500 MG CHEW Chew 500 mg by mouth daily.    [provider]  aspirin 81 MG tablet Take 81 mg by mouth at bedtime.    [provider]  cetirizine  (ZYRTEC ) 5 MG tablet Take 1 tablet (5 mg total) by mouth at bedtime. 05/06/18   Nche, Roselie Rockford, NP  Coenzyme Q10 (COQ10) 100 MG CAPS Take by mouth at bedtime.    [provider]  guaiFENesin  (MUCINEX ) 600 MG 12 hr tablet Take 1 tablet (600 mg total) by mouth 2 (two) times daily as needed for cough or to loosen phlegm. 02/21/24   Nche, Roselie Rockford, NP  linaclotide  (LINZESS ) 145 MCG CAPS capsule Take 1 capsule (145 mcg total) by mouth daily before breakfast. 04/07/24   Nche, Roselie Rockford, NP  losartan  (COZAAR ) 100 MG tablet TAKE 1 TABLET DAILY (DOSE  CHANGE) 04/02/24   Nche, Roselie Rockford, NP  MAGNESIUM PO Take by mouth daily.    [provider]  Multiple Vitamin (MULTIVITAMIN PO) Take by mouth.    [provider]  NON FORMULARY CBD Gummy 10 mg prn    [provider]  pravastatin  (PRAVACHOL ) 40 MG tablet TAKE 1/2 TABLET DAILY 04/02/24   Nche, Roselie Rockford, NP  Probiotic Product (PROBIOTIC PO) 1 tablet. 02/20/21   [provider]    Family History Family History  Problem Relation Age of Onset   Diabetes Mother    Hyperlipidemia Mother    Hypertension Mother    Arthritis Mother    Prostate cancer Father    Cancer Father    Breast cancer Sister    Obesity Sister    Cancer - Other Brother        neck ? lymph nodes   Atrial fibrillation Brother    Colon cancer Neg Hx    Pancreatic cancer Neg Hx    Stomach cancer Neg Hx     Social History Social History[1]   Allergies   Patient has no known allergies.   Review of Systems Review of Systems   Constitutional:  Positive for fever.  HENT:  Positive for sore throat. Negative for congestion, ear pain and rhinorrhea.   Respiratory:  Positive for cough. Negative for shortness of breath and wheezing.   Gastrointestinal:  Negative for diarrhea, nausea and vomiting.  Musculoskeletal:  Positive for arthralgias. Negative for myalgias.  Skin:  Negative for rash.  Neurological:  Positive for headaches.     Physical Exam Triage Vital Signs ED Triage Vitals  Encounter Vitals Group  BP 10/05/24 1418 (!) 167/89     Girls Systolic BP Percentile --      Girls Diastolic BP Percentile --      Boys Systolic BP Percentile --      Boys Diastolic BP Percentile --      Pulse Rate 10/05/24 1418 93     Resp 10/05/24 1418 18     Temp 10/05/24 1418 (!) 101.2 F (38.4 C)     Temp Source 10/05/24 1418 Oral     SpO2 10/05/24 1418 94 %     Weight 10/05/24 1418 173 lb (78.5 kg)     Height 10/05/24 1418 5' 5 (1.651 m)     Head Circumference --      Peak Flow --      Pain Score 10/05/24 1417 3     Pain Loc --      Pain Education --      Exclude from Growth Chart --    No data found.  Updated Vital Signs BP (!) 167/89 (BP Location: Right Arm)   Pulse 93   Temp (!) 101.2 F (38.4 C) (Oral)   Resp 18   Ht 5' 5 (1.651 m)   Wt 173 lb (78.5 kg)   LMP 10/23/1994   SpO2 94%   BMI 28.79 kg/m   Visual Acuity Right Eye Distance:   Left Eye Distance:   Bilateral Distance:    Right Eye Near:   Left Eye Near:    Bilateral Near:     Physical Exam Vitals reviewed.  Constitutional:      General: She is awake. She is not in acute distress.    Appearance: Normal appearance. She is well-developed and well-groomed. She is not ill-appearing, toxic-appearing or diaphoretic.  HENT:     Head: Normocephalic and atraumatic.     Right Ear: Hearing, tympanic membrane and ear canal normal.     Left Ear: Hearing, tympanic membrane and ear canal normal.     Mouth/Throat:     Lips: Pink.     Mouth:  Mucous membranes are moist.     Pharynx: Oropharynx is clear. Uvula midline. No pharyngeal swelling, oropharyngeal exudate, posterior oropharyngeal erythema, uvula swelling or postnasal drip.  Cardiovascular:     Rate and Rhythm: Normal rate and regular rhythm.     Pulses: Normal pulses.          Radial pulses are 2+ on the right side and 2+ on the left side.     Heart sounds: Normal heart sounds. No murmur heard.    No friction rub. No gallop.  Pulmonary:     Effort: Pulmonary effort is normal.     Breath sounds: Normal breath sounds. No decreased air movement. No decreased breath sounds, wheezing, rhonchi or rales.  Musculoskeletal:     Cervical back: Normal range of motion and neck supple.  Lymphadenopathy:     Head:     Right side of head: No submental, submandibular or preauricular adenopathy.     Left side of head: No submental, submandibular or preauricular adenopathy.     Cervical:     Right cervical: No superficial cervical adenopathy.    Left cervical: No superficial cervical adenopathy.     Upper Body:     Right upper body: No supraclavicular adenopathy.     Left upper body: No supraclavicular adenopathy.  Skin:    General: Skin is warm and dry.  Neurological:     Mental Status: She is alert.  Psychiatric:  Mood and Affect: Mood normal.        Speech: Speech normal.        Behavior: Behavior normal. Behavior is cooperative.      UC Treatments / Results  Labs (all labs ordered are listed, but only abnormal results are displayed) Labs Reviewed  POC COVID19/FLU A&B COMBO - Abnormal; Notable for the following components:      Result Value   Influenza A Antigen, POC Positive (*)    All other components within normal limits    EKG   Radiology No results found.  Procedures Procedures (including critical care time)  Medications Ordered in UC Medications  acetaminophen  (TYLENOL ) tablet 650 mg (650 mg Oral Given 10/05/24 1428)    Initial Impression /  Assessment and Plan / UC Course  I have reviewed the triage vital signs and the nursing notes.  Pertinent labs & imaging results that were available during my care of the patient were reviewed by me and considered in my medical decision making (see chart for details).      Final Clinical Impressions(s) / UC Diagnoses   Final diagnoses:  Upper respiratory tract infection, unspecified type  Influenza A   Influenza A infection Confirmed by positive test. Symptoms include headache, cough with phlegm, sore throat, and fever since Friday. No respiratory distress, wheezing, or significant nasal congestion. No nausea, vomiting, or diarrhea. Prefers to avoid Tamiflu due to concerns about nausea and vomiting, especially given recent hospitalization for a blood clot. Decision to manage symptoms with over-the-counter medications. - Continue over-the-counter medications for symptom relief. - Refilled Tessalon  Perles for cough management. - Monitor for significant trouble breathing, persistent fever despite Tylenol  and ibuprofen, neck pain or stiffness, confusion, or loss of consciousness. - Take Tylenol  every six to eight hours, ensuring not to exceed the maximum daily dose when using combination medications.    Discharge Instructions      VISIT SUMMARY:  You came in today with symptoms of a cold, including headache, cough, and sore throat, which started on October 03, 2024. You have been using Mucinex  and gargling with salt water for relief. You also mentioned a recent history of a blood clot in your leg and a surgery about a month and a half ago.  YOUR PLAN:  -INFLUENZA A INFECTION: You have been diagnosed with Influenza A, which is a viral infection that affects your respiratory system. Your symptoms include headache, cough with phlegm, sore throat, and fever. You should continue using over-the-counter medications for symptom relief and take Tylenol  every six to eight hours, making sure not to  exceed the maximum daily dose. Your prescription for Tessalon  Perles has been refilled to help manage your cough. Please monitor for any significant trouble breathing, persistent fever despite taking Tylenol  and ibuprofen, neck pain or stiffness, confusion, or loss of consciousness.  INSTRUCTIONS:  Please monitor your symptoms closely. If you experience significant trouble breathing, persistent fever despite taking Tylenol  and ibuprofen, neck pain or stiffness, confusion, or loss of consciousness, seek medical attention immediately.     ED Prescriptions     Medication Sig Dispense Auth. Provider   benzonatate  (TESSALON ) 100 MG capsule Take 1 capsule (100 mg total) by mouth every 8 (eight) hours. 21 capsule Antrell Tipler E, PA-C      PDMP not reviewed this encounter.     [1]  Social History Tobacco Use   Smoking status: Former    Current packs/day: 0.00    Average packs/day: 0.3 packs/day for 10.0 years (  2.5 ttl pk-yrs)    Types: Cigarettes    Start date: 10/23/1973    Quit date: 10/24/1983    Years since quitting: 40.9   Smokeless tobacco: Never  Vaping Use   Vaping status: Never Used  Substance Use Topics   Alcohol use: Not Currently   Drug use: No     Adanya Sosinski, Rocky BRAVO, PA-C 10/05/24 1513

## 2024-10-05 NOTE — Discharge Instructions (Addendum)
 VISIT SUMMARY:  You came in today with symptoms of a cold, including headache, cough, and sore throat, which started on October 03, 2024. You have been using Mucinex  and gargling with salt water for relief. You also mentioned a recent history of a blood clot in your leg and a surgery about a month and a half ago.  YOUR PLAN:  -INFLUENZA A INFECTION: You have been diagnosed with Influenza A, which is a viral infection that affects your respiratory system. Your symptoms include headache, cough with phlegm, sore throat, and fever. You should continue using over-the-counter medications for symptom relief and take Tylenol  every six to eight hours, making sure not to exceed the maximum daily dose. Your prescription for Tessalon  Erminio has been refilled to help manage your cough. Please monitor for any significant trouble breathing, persistent fever despite taking Tylenol  and ibuprofen, neck pain or stiffness, confusion, or loss of consciousness.  INSTRUCTIONS:  Please monitor your symptoms closely. If you experience significant trouble breathing, persistent fever despite taking Tylenol  and ibuprofen, neck pain or stiffness, confusion, or loss of consciousness, seek medical attention immediately.

## 2024-10-05 NOTE — ED Triage Notes (Addendum)
 Pt presents with complaints of headaches, fevers, and cough. Headache started two days ago. Noted fever yesterday. Highest temperature at home has been 101.3 F. Have been taking OTC Mucinex  + cold & flu medication with improvement. Also has been taking prescribed tessalon  pearls. Last took cold & flu medication this morning at approximately 8 AM. Unable to sleep. Denies sick contacts. Rates overall pain a 3/10. Feels pressure in head/face.

## 2024-11-26 ENCOUNTER — Ambulatory Visit: Admitting: Nurse Practitioner

## 2024-11-26 ENCOUNTER — Encounter: Payer: Self-pay | Admitting: Nurse Practitioner

## 2024-11-26 VITALS — BP 122/76 | HR 70 | Ht 65.0 in | Wt 174.6 lb

## 2024-11-26 DIAGNOSIS — Z86718 Personal history of other venous thrombosis and embolism: Secondary | ICD-10-CM | POA: Insufficient documentation

## 2024-11-26 DIAGNOSIS — C7B8 Other secondary neuroendocrine tumors: Secondary | ICD-10-CM

## 2024-11-26 DIAGNOSIS — R7303 Prediabetes: Secondary | ICD-10-CM

## 2024-11-26 DIAGNOSIS — K581 Irritable bowel syndrome with constipation: Secondary | ICD-10-CM

## 2024-11-26 DIAGNOSIS — E782 Mixed hyperlipidemia: Secondary | ICD-10-CM

## 2024-11-26 DIAGNOSIS — C7A8 Other malignant neuroendocrine tumors: Secondary | ICD-10-CM

## 2024-11-26 DIAGNOSIS — I10 Essential (primary) hypertension: Secondary | ICD-10-CM

## 2024-11-26 DIAGNOSIS — Z780 Asymptomatic menopausal state: Secondary | ICD-10-CM

## 2024-11-26 LAB — LIPID PANEL
Cholesterol: 144 mg/dL (ref 28–200)
HDL: 46.5 mg/dL
LDL Cholesterol: 80 mg/dL (ref 10–99)
NonHDL: 97.17
Total CHOL/HDL Ratio: 3
Triglycerides: 84 mg/dL (ref 10.0–149.0)
VLDL: 16.8 mg/dL (ref 0.0–40.0)

## 2024-11-26 LAB — HEMOGLOBIN A1C: Hgb A1c MFr Bld: 5.8 % (ref 4.6–6.5)

## 2024-11-26 MED ORDER — PRAVASTATIN SODIUM 20 MG PO TABS: 20.0000 mg | ORAL_TABLET | Freq: Every day | ORAL | 1 refills | Status: DC

## 2024-11-26 MED ORDER — APIXABAN 5 MG PO TABS: 5.0000 mg | ORAL_TABLET | Freq: Two times a day (BID) | ORAL | 0 refills | Status: AC

## 2024-11-26 NOTE — Assessment & Plan Note (Signed)
 Repeat hgbA1c

## 2024-11-26 NOTE — Assessment & Plan Note (Addendum)
 Involving the R. Mid-distal femoral, gastrocnemius, popliteal, and posterior tibial veins 1st incident, provoked due to current diagnosis and 3weeks post abdominal surgery on 08/28/2024. Eliquis  5mg  initiated since 09/15/2024 Repeat venous doppler 10/29/2023: no DVT noted.  After collaboration with Supervising physician-Dr. Thedora, Heidi [name] was advised to maintain eliquis  5mg  BID dose x 6months, then decrease to 2.5mg  BID continuously. She was advised about signs of bleeding and to call call the office if present. Ref to hematology if she opts to discontinue with active hx of cancer. Med refill sent

## 2024-11-26 NOTE — Assessment & Plan Note (Signed)
 Unable to afford linzess  Current Use of colace, prune juice and chia seeds. Constipation has improved.

## 2024-11-26 NOTE — Assessment & Plan Note (Addendum)
 Under the care of [facility] Recent GI surgery on 08/28/2025. Discharged on 09/04/2025: extensive lysis of adhesion, bowel resection with enteroenterostomy. She has appointment next week for repeat PET scan.

## 2024-11-26 NOTE — Assessment & Plan Note (Signed)
 Losartan  discontinued prior to surgery in 08/2024. Med was not resumed since then. Has maintain amlodipine  10mg  daily. BP at goal today BP Readings from Last 3 Encounters:  11/26/24 122/76  10/05/24 (!) 167/89  05/26/24 130/72    Maintain current med dose Advised to Check BP at home in AM, 2-3x/week Call office if BP >140/80 for more than 3days.f/up in 3months

## 2024-11-26 NOTE — Progress Notes (Signed)
 "               Established Patient Visit  Patient: Heidi [name]   DOB: [birth year]   79 y.o. Female  MRN: [id number] Visit Date: 11/26/2024  Subjective:    Chief Complaint  Patient presents with   Follow-up    FASTING 6 month follow up for HTN, Prediabetes, Hyperlipidemia    HPI Essential hypertension Losartan  discontinued prior to surgery in 08/2024. Med was not resumed since then. Has maintain amlodipine  10mg  daily. BP at goal today BP Readings from Last 3 Encounters:  11/26/24 122/76  10/05/24 (!) 167/89  05/26/24 130/72    Maintain current med dose Advised to Check BP at home in AM, 2-3x/week Call office if BP >140/80 for more than 3days.f/up in 3months  Irritable bowel syndrome Unable to afford linzess  Current Use of colace, prune juice and chia seeds. Constipation has improved.  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 Discharged on 09/04/2025: extensive lysis of adhesion, bowel resection with enteroenterostomy. She has appointment next week for repeat PET scan.  History of DVT (deep vein thrombosis) Involving the R. Mid-distal femoral, gastrocnemius, popliteal, and posterior tibial veins 1st incident, provoked due to current diagnosis and 3weeks post abdominal surgery on 08/28/2024. Eliquis  5mg  initiated since 09/15/2024 Repeat venous doppler 10/29/2023: no DVT noted.  After collaboration with Supervising physician-Dr. Thedora, Heidi [name] was advised to maintain eliquis  5mg  BID dose x 6months, then decrease to 2.5mg  BID continuously. She was advised about signs of bleeding and to call call the office if present. Ref to hematology if she opts to discontinue with active hx of cancer. Med refill sent  Prediabetes Repeat hgbA1c  BP Readings from Last 3 Encounters:  11/26/24 122/76  10/05/24 (!) 167/89  05/26/24 130/72    Wt Readings from  Last 3 Encounters:  11/26/24 174 lb 9.6 oz (79.2 kg)  10/05/24 173 lb (78.5 kg)  05/26/24 182 lb 12.8 oz (82.9 kg)    Reviewed medical, surgical, and social history today  Medications: Show/hide medication list[1] Reviewed past medical and social history.   ROS per HPI above  Last CBC Lab Results  Component Value Date   WBC 7.2 12/09/2019   HGB 13.4 12/09/2019   HCT 41 12/09/2019   MCV 89.9 11/27/2017   RDW 14.9 11/27/2017   PLT 303 12/09/2019   Last metabolic panel Lab Results  Component Value Date   GLUCOSE 87 12/06/2023   NA 141 12/06/2023   K 4.1 12/06/2023   CL 104 12/06/2023   CO2 29 12/06/2023   BUN 16 12/06/2023   CREATININE 0.89 12/06/2023   GFR 62.88 12/06/2023   CALCIUM  9.8 12/06/2023   PHOS 3.8 11/30/2022   PROT 7.8 12/06/2023   ALBUMIN 4.4 12/06/2023   BILITOT 0.7 12/06/2023   ALKPHOS 89 12/06/2023   AST 19 12/06/2023   ALT 12 12/06/2023   Last hemoglobin A1c Lab Results  Component Value Date   HGBA1C 5.6 05/26/2024   HGBA1C 5.6 05/26/2024        Objective:  BP 122/76 (BP Location: Right Arm, Patient Position: Sitting, Cuff Size: Large)   Pulse 70   Ht 5' 5 (1.651 m)   Wt 174 lb 9.6 oz (79.2 kg)   LMP 10/23/1994   SpO2 98%   BMI 29.05 kg/m      Physical Exam Vitals and nursing note reviewed.  Cardiovascular:  Rate and Rhythm: Normal rate and regular rhythm.     Pulses: Normal pulses.     Heart sounds: Normal heart sounds.  Pulmonary:     Effort: Pulmonary effort is normal.     Breath sounds: Normal breath sounds.  Neurological:     Mental Status: She is alert and oriented to person, place, and time.     No results found for any visits on 11/26/24.    Assessment & Plan:    Problem List Items Addressed This Visit     Essential hypertension - Primary   Losartan  discontinued prior to surgery in 08/2024. Med was not resumed since then. Has maintain amlodipine  10mg  daily. BP at goal today BP Readings from Last 3  Encounters:  11/26/24 122/76  10/05/24 (!) 167/89  05/26/24 130/72    Maintain current med dose Advised to Check BP at home in AM, 2-3x/week Call office if BP >140/80 for more than 3days.f/up in 3months      Relevant Medications   pravastatin  (PRAVACHOL ) 20 MG tablet   apixaban  (ELIQUIS ) 5 MG TABS tablet   History of DVT (deep vein thrombosis)   Involving the R. Mid-distal femoral, gastrocnemius, popliteal, and posterior tibial veins 1st incident, provoked due to current diagnosis and 3weeks post abdominal surgery on 08/28/2024. Eliquis  5mg  initiated since 09/15/2024 Repeat venous doppler 10/29/2023: no DVT noted.  After collaboration with Supervising physician-Dr. Thedora, Heidi [name] was advised to maintain eliquis  5mg  BID dose x 6months, then decrease to 2.5mg  BID continuously. She was advised about signs of bleeding and to call call the office if present. Ref to hematology if she opts to discontinue with active hx of cancer. Med refill sent      Relevant Medications   apixaban  (ELIQUIS ) 5 MG TABS tablet   Hyperlipidemia   Relevant Medications   pravastatin  (PRAVACHOL ) 20 MG tablet   apixaban  (ELIQUIS ) 5 MG TABS tablet   Other Relevant Orders   Lipid panel   Irritable bowel syndrome   Unable to afford linzess  Current Use of colace, prune juice and chia seeds. Constipation has improved.      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 Discharged on 09/04/2025: extensive lysis of adhesion, bowel resection with enteroenterostomy. She has appointment next week for repeat PET scan.      Prediabetes   Repeat hgbA1c      Relevant Orders   Hemoglobin A1c   Other Visit Diagnoses       Asymptomatic postmenopausal estrogen deficiency       Relevant Orders   DG Bone Density      Return in  about 3 months (around 02/23/2025) for HTN, prediabetes, hyperlipidemia (fasting).     Heidi Mood, NP      [1]  Outpatient Medications Prior to Visit  Medication Sig Note   ALPHA LIPOIC ACID PO Take by mouth at bedtime.    amLODipine  (NORVASC ) 10 MG tablet TAKE 1 TABLET AT BEDTIME    anastrozole (ARIMIDEX) 1 MG tablet Take 1 mg by mouth daily.    cetirizine  (ZYRTEC ) 5 MG tablet Take 1 tablet (5 mg total) by mouth at bedtime.    Coenzyme Q10 (COQ10) 100 MG CAPS Take by mouth at bedtime.    guaiFENesin  (MUCINEX ) 600 MG 12 hr tablet Take 1 tablet (600 mg total) by mouth 2 (  two) times daily as needed for cough or to loosen phlegm.    MAGNESIUM PO Take by mouth daily.    Multiple Vitamin (MULTIVITAMIN PO) Take by mouth.    Probiotic Product (PROBIOTIC PO) 1 tablet.    [DISCONTINUED] apixaban  (ELIQUIS ) 5 MG TABS tablet Take 5 mg by mouth.    [DISCONTINUED] aspirin 81 MG tablet Take 81 mg by mouth at bedtime.    [DISCONTINUED] pravastatin  (PRAVACHOL ) 40 MG tablet TAKE 1/2 TABLET DAILY    Docusate Sodium (DSS) 100 MG CAPS Take 100 mg by mouth daily.    [DISCONTINUED] Ascorbic Acid (SM CHEWABLE VITAMIN C) 500 MG CHEW Chew 500 mg by mouth daily.    [DISCONTINUED] benzonatate  (TESSALON ) 100 MG capsule Take 1 capsule (100 mg total) by mouth every 8 (eight) hours.    [DISCONTINUED] linaclotide  (LINZESS ) 145 MCG CAPS capsule Take 1 capsule (145 mcg total) by mouth daily before breakfast.    [DISCONTINUED] losartan  (COZAAR ) 100 MG tablet TAKE 1 TABLET DAILY (DOSE  CHANGE) (Patient not taking: Reported on 11/26/2024) 11/26/2024: Hospital told to hold until PCP appointment to discuss    [DISCONTINUED] NON FORMULARY CBD Gummy 10 mg prn    No facility-administered medications prior to visit.   "

## 2024-11-26 NOTE — Patient Instructions (Signed)
 Go to lab Schedule bone density at the front desk Maintain Heart healthy diet and daily exercise. Maintain current medications. Check BP at home in AM, 2-3x/week Call office if BP >140/80 for more than 3days.

## 2024-11-27 ENCOUNTER — Ambulatory Visit: Payer: Self-pay | Admitting: Nurse Practitioner

## 2024-11-27 DIAGNOSIS — E782 Mixed hyperlipidemia: Secondary | ICD-10-CM

## 2024-11-27 MED ORDER — PRAVASTATIN SODIUM 40 MG PO TABS: 40.0000 mg | ORAL_TABLET | Freq: Every day | ORAL | 1 refills | Status: AC

## 2024-11-27 NOTE — Assessment & Plan Note (Signed)
 ASCVD risk at 10.9% Hx of aortic artherosclerosis. Repeat LDL at 80 Increase pravastatin  dose to 40mg  Repeat labs in 3-68months (fasting)

## 2024-12-03 ENCOUNTER — Inpatient Hospital Stay: Admission: RE | Admit: 2024-12-03

## 2025-02-23 ENCOUNTER — Ambulatory Visit: Admitting: Nurse Practitioner

## 2025-02-27 ENCOUNTER — Ambulatory Visit
